# Patient Record
Sex: Female | Born: 1955
Health system: Southern US, Community
[De-identification: ages and names within clinical notes are randomized; demographics above are authoritative.]

## PROBLEM LIST (undated history)

## (undated) DIAGNOSIS — E119 Type 2 diabetes mellitus without complications: Secondary | ICD-10-CM

## (undated) DIAGNOSIS — N189 Chronic kidney disease, unspecified: Secondary | ICD-10-CM

## (undated) DIAGNOSIS — Z923 Personal history of irradiation: Secondary | ICD-10-CM

## (undated) DIAGNOSIS — Z9221 Personal history of antineoplastic chemotherapy: Secondary | ICD-10-CM

## (undated) DIAGNOSIS — J189 Pneumonia, unspecified organism: Secondary | ICD-10-CM

## (undated) DIAGNOSIS — K219 Gastro-esophageal reflux disease without esophagitis: Secondary | ICD-10-CM

## (undated) DIAGNOSIS — I10 Essential (primary) hypertension: Secondary | ICD-10-CM

## (undated) DIAGNOSIS — M170 Bilateral primary osteoarthritis of knee: Secondary | ICD-10-CM

## (undated) DIAGNOSIS — Z803 Family history of malignant neoplasm of breast: Secondary | ICD-10-CM

## (undated) DIAGNOSIS — Z973 Presence of spectacles and contact lenses: Secondary | ICD-10-CM

## (undated) DIAGNOSIS — C50919 Malignant neoplasm of unspecified site of unspecified female breast: Secondary | ICD-10-CM

## (undated) HISTORY — PX: MENISCUS REPAIR: SHX5179

## (undated) HISTORY — PX: ABDOMINAL HYSTERECTOMY: SHX81

## (undated) HISTORY — DX: Bilateral primary osteoarthritis of knee: M17.0

## (undated) HISTORY — DX: Family history of malignant neoplasm of breast: Z80.3

## (undated) HISTORY — DX: Type 2 diabetes mellitus without complications: E11.9

## (undated) HISTORY — PX: BLADDER SUSPENSION: SHX72

## (undated) HISTORY — DX: Malignant neoplasm of unspecified site of unspecified female breast: C50.919

## (undated) HISTORY — PX: BREAST LUMPECTOMY: SHX2

## (undated) HISTORY — PX: OOPHORECTOMY: SHX86

## (undated) HISTORY — PX: JOINT REPLACEMENT: SHX530

---

## 1976-10-02 HISTORY — PX: APPENDECTOMY: SHX54

## 1977-10-02 HISTORY — PX: ABDOMINAL HYSTERECTOMY: SHX81

## 1998-05-31 ENCOUNTER — Ambulatory Visit (HOSPITAL_COMMUNITY): Admission: RE | Admit: 1998-05-31 | Discharge: 1998-05-31 | Payer: Self-pay

## 1998-12-29 ENCOUNTER — Encounter: Admission: RE | Admit: 1998-12-29 | Discharge: 1999-01-24 | Payer: Self-pay | Admitting: *Deleted

## 2000-11-13 ENCOUNTER — Emergency Department (HOSPITAL_COMMUNITY): Admission: EM | Admit: 2000-11-13 | Discharge: 2000-11-13 | Payer: Self-pay | Admitting: Emergency Medicine

## 2001-04-25 ENCOUNTER — Other Ambulatory Visit: Admission: RE | Admit: 2001-04-25 | Discharge: 2001-04-25 | Payer: Self-pay | Admitting: Obstetrics and Gynecology

## 2001-06-21 ENCOUNTER — Ambulatory Visit (HOSPITAL_COMMUNITY): Admission: RE | Admit: 2001-06-21 | Discharge: 2001-06-21 | Payer: Self-pay | Admitting: Gastroenterology

## 2003-02-25 ENCOUNTER — Emergency Department (HOSPITAL_COMMUNITY): Admission: EM | Admit: 2003-02-25 | Discharge: 2003-02-25 | Payer: Self-pay | Admitting: Emergency Medicine

## 2003-05-21 ENCOUNTER — Encounter: Payer: Self-pay | Admitting: Obstetrics and Gynecology

## 2003-05-21 ENCOUNTER — Ambulatory Visit (HOSPITAL_COMMUNITY): Admission: RE | Admit: 2003-05-21 | Discharge: 2003-05-21 | Payer: Self-pay | Admitting: Obstetrics and Gynecology

## 2004-04-13 ENCOUNTER — Emergency Department (HOSPITAL_COMMUNITY): Admission: EM | Admit: 2004-04-13 | Discharge: 2004-04-13 | Payer: Self-pay | Admitting: Emergency Medicine

## 2004-07-29 ENCOUNTER — Ambulatory Visit: Payer: Self-pay | Admitting: Gastroenterology

## 2005-03-09 ENCOUNTER — Ambulatory Visit (HOSPITAL_COMMUNITY): Admission: RE | Admit: 2005-03-09 | Discharge: 2005-03-09 | Payer: Self-pay | Admitting: Obstetrics & Gynecology

## 2006-01-17 ENCOUNTER — Ambulatory Visit (HOSPITAL_COMMUNITY): Admission: RE | Admit: 2006-01-17 | Discharge: 2006-01-17 | Payer: Self-pay | Admitting: Pulmonary Disease

## 2006-02-22 ENCOUNTER — Ambulatory Visit (HOSPITAL_COMMUNITY): Admission: RE | Admit: 2006-02-22 | Discharge: 2006-02-22 | Payer: Self-pay | Admitting: Internal Medicine

## 2006-02-22 ENCOUNTER — Ambulatory Visit: Payer: Self-pay | Admitting: Internal Medicine

## 2006-03-12 ENCOUNTER — Ambulatory Visit (HOSPITAL_COMMUNITY): Admission: RE | Admit: 2006-03-12 | Discharge: 2006-03-12 | Payer: Self-pay | Admitting: Internal Medicine

## 2006-03-12 ENCOUNTER — Ambulatory Visit: Payer: Self-pay | Admitting: Internal Medicine

## 2006-03-18 ENCOUNTER — Emergency Department (HOSPITAL_COMMUNITY): Admission: EM | Admit: 2006-03-18 | Discharge: 2006-03-18 | Payer: Self-pay | Admitting: Emergency Medicine

## 2006-04-13 ENCOUNTER — Ambulatory Visit (HOSPITAL_COMMUNITY): Admission: RE | Admit: 2006-04-13 | Discharge: 2006-04-13 | Payer: Self-pay | Admitting: Obstetrics and Gynecology

## 2006-10-16 ENCOUNTER — Emergency Department (HOSPITAL_COMMUNITY): Admission: EM | Admit: 2006-10-16 | Discharge: 2006-10-16 | Payer: Self-pay | Admitting: Family Medicine

## 2006-11-08 ENCOUNTER — Ambulatory Visit (HOSPITAL_COMMUNITY): Admission: RE | Admit: 2006-11-08 | Discharge: 2006-11-08 | Payer: Self-pay | Admitting: Pulmonary Disease

## 2007-03-20 ENCOUNTER — Ambulatory Visit (HOSPITAL_COMMUNITY): Admission: RE | Admit: 2007-03-20 | Discharge: 2007-03-20 | Payer: Self-pay | Admitting: Pulmonary Disease

## 2007-04-08 ENCOUNTER — Encounter: Payer: Self-pay | Admitting: Obstetrics and Gynecology

## 2007-04-08 ENCOUNTER — Observation Stay (HOSPITAL_COMMUNITY): Admission: RE | Admit: 2007-04-08 | Discharge: 2007-04-09 | Payer: Self-pay | Admitting: Obstetrics and Gynecology

## 2007-08-02 ENCOUNTER — Ambulatory Visit (HOSPITAL_COMMUNITY): Admission: RE | Admit: 2007-08-02 | Discharge: 2007-08-02 | Payer: Self-pay | Admitting: Pulmonary Disease

## 2007-09-20 ENCOUNTER — Emergency Department (HOSPITAL_COMMUNITY): Admission: EM | Admit: 2007-09-20 | Discharge: 2007-09-20 | Payer: Self-pay | Admitting: Emergency Medicine

## 2008-02-03 ENCOUNTER — Ambulatory Visit (HOSPITAL_COMMUNITY): Admission: RE | Admit: 2008-02-03 | Discharge: 2008-02-03 | Payer: Self-pay | Admitting: Sports Medicine

## 2008-05-25 ENCOUNTER — Ambulatory Visit (HOSPITAL_BASED_OUTPATIENT_CLINIC_OR_DEPARTMENT_OTHER): Admission: RE | Admit: 2008-05-25 | Discharge: 2008-05-25 | Payer: Self-pay | Admitting: Orthopedic Surgery

## 2009-10-02 DIAGNOSIS — C50919 Malignant neoplasm of unspecified site of unspecified female breast: Secondary | ICD-10-CM

## 2009-10-02 HISTORY — DX: Malignant neoplasm of unspecified site of unspecified female breast: C50.919

## 2010-04-08 ENCOUNTER — Encounter: Admission: RE | Admit: 2010-04-08 | Discharge: 2010-04-08 | Payer: Self-pay | Admitting: Pulmonary Disease

## 2010-04-15 ENCOUNTER — Encounter: Admission: RE | Admit: 2010-04-15 | Discharge: 2010-04-15 | Payer: Self-pay | Admitting: Pulmonary Disease

## 2010-05-06 ENCOUNTER — Encounter: Admission: RE | Admit: 2010-05-06 | Discharge: 2010-05-06 | Payer: Self-pay | Admitting: Surgery

## 2010-05-10 ENCOUNTER — Ambulatory Visit (HOSPITAL_BASED_OUTPATIENT_CLINIC_OR_DEPARTMENT_OTHER): Admission: RE | Admit: 2010-05-10 | Discharge: 2010-05-10 | Payer: Self-pay | Admitting: Surgery

## 2010-05-18 ENCOUNTER — Ambulatory Visit: Payer: Self-pay | Admitting: Oncology

## 2010-05-27 ENCOUNTER — Encounter: Admission: RE | Admit: 2010-05-27 | Discharge: 2010-05-27 | Payer: Self-pay | Admitting: Oncology

## 2010-06-03 ENCOUNTER — Ambulatory Visit
Admission: RE | Admit: 2010-06-03 | Discharge: 2010-06-29 | Payer: Self-pay | Source: Home / Self Care | Admitting: Radiation Oncology

## 2010-06-03 ENCOUNTER — Encounter: Admission: RE | Admit: 2010-06-03 | Discharge: 2010-06-03 | Payer: Self-pay | Admitting: Oncology

## 2010-06-17 ENCOUNTER — Ambulatory Visit: Payer: Self-pay | Admitting: Oncology

## 2010-06-28 ENCOUNTER — Ambulatory Visit: Payer: Self-pay | Admitting: Genetic Counselor

## 2010-07-01 LAB — CBC WITH DIFFERENTIAL/PLATELET
BASO%: 0 % (ref 0.0–2.0)
EOS%: 0 % (ref 0.0–7.0)
HGB: 13.5 g/dL (ref 11.6–15.9)
LYMPH%: 6.9 % — ABNORMAL LOW (ref 14.0–49.7)
MCHC: 33.3 g/dL (ref 31.5–36.0)
MONO#: 0.4 10*3/uL (ref 0.1–0.9)
MONO%: 1.8 % (ref 0.0–14.0)
NEUT%: 91.3 % — ABNORMAL HIGH (ref 38.4–76.8)
RDW: 14.1 % (ref 11.2–14.5)

## 2010-07-08 LAB — COMPREHENSIVE METABOLIC PANEL
AST: 25 U/L (ref 0–37)
Albumin: 3.7 g/dL (ref 3.5–5.2)
Alkaline Phosphatase: 67 U/L (ref 39–117)
CO2: 30 mEq/L (ref 19–32)
Calcium: 9.6 mg/dL (ref 8.4–10.5)
Creatinine, Ser: 0.87 mg/dL (ref 0.40–1.20)
Glucose, Bld: 97 mg/dL (ref 70–99)
Total Bilirubin: 0.7 mg/dL (ref 0.3–1.2)

## 2010-07-08 LAB — CBC WITH DIFFERENTIAL/PLATELET
BASO%: 0.8 % (ref 0.0–2.0)
MCV: 84.1 fL (ref 79.5–101.0)
Platelets: 359 10*3/uL (ref 145–400)
RDW: 13.5 % (ref 11.2–14.5)
nRBC: 1 % — ABNORMAL HIGH (ref 0–0)

## 2010-07-20 ENCOUNTER — Ambulatory Visit: Payer: Self-pay | Admitting: Oncology

## 2010-07-22 LAB — CBC WITH DIFFERENTIAL/PLATELET
BASO%: 0.1 % (ref 0.0–2.0)
Basophils Absolute: 0 10*3/uL (ref 0.0–0.1)
EOS%: 0 % (ref 0.0–7.0)
Eosinophils Absolute: 0 10*3/uL (ref 0.0–0.5)
HCT: 35.5 % (ref 34.8–46.6)
LYMPH%: 5.9 % — ABNORMAL LOW (ref 14.0–49.7)
MCH: 27.9 pg (ref 25.1–34.0)
MONO#: 0.3 10*3/uL (ref 0.1–0.9)
MONO%: 1.7 % (ref 0.0–14.0)
NEUT%: 92.3 % — ABNORMAL HIGH (ref 38.4–76.8)
RBC: 4.23 10*6/uL (ref 3.70–5.45)
lymph#: 1 10*3/uL (ref 0.9–3.3)

## 2010-07-22 LAB — COMPREHENSIVE METABOLIC PANEL
AST: 22 U/L (ref 0–37)
Alkaline Phosphatase: 56 U/L (ref 39–117)
BUN: 11 mg/dL (ref 6–23)
Glucose, Bld: 153 mg/dL — ABNORMAL HIGH (ref 70–99)
Sodium: 139 mEq/L (ref 135–145)

## 2010-07-29 LAB — CBC WITH DIFFERENTIAL/PLATELET
EOS%: 1.7 % (ref 0.0–7.0)
HCT: 35.2 % (ref 34.8–46.6)
MCH: 28.8 pg (ref 25.1–34.0)
MCHC: 33.1 g/dL (ref 31.5–36.0)
NEUT#: 13.4 10*3/uL — ABNORMAL HIGH (ref 1.5–6.5)
NEUT%: 82.6 % — ABNORMAL HIGH (ref 38.4–76.8)
Platelets: 356 10*3/uL (ref 145–400)
WBC: 16.3 10*3/uL — ABNORMAL HIGH (ref 3.9–10.3)

## 2010-08-08 LAB — CBC WITH DIFFERENTIAL/PLATELET
BASO%: 0.2 % (ref 0.0–2.0)
Basophils Absolute: 0 10*3/uL (ref 0.0–0.1)
HGB: 11.6 g/dL (ref 11.6–15.9)
LYMPH%: 22.3 % (ref 14.0–49.7)
MCHC: 33.4 g/dL (ref 31.5–36.0)
MONO%: 5.2 % (ref 0.0–14.0)
NEUT#: 5.9 10*3/uL (ref 1.5–6.5)
RBC: 4.01 10*6/uL (ref 3.70–5.45)
RDW: 14.9 % — ABNORMAL HIGH (ref 11.2–14.5)
lymph#: 1.8 10*3/uL (ref 0.9–3.3)

## 2010-08-08 LAB — COMPREHENSIVE METABOLIC PANEL
CO2: 28 mEq/L (ref 19–32)
Creatinine, Ser: 0.9 mg/dL (ref 0.40–1.20)
Potassium: 2.9 mEq/L — ABNORMAL LOW (ref 3.5–5.3)
Total Bilirubin: 0.7 mg/dL (ref 0.3–1.2)

## 2010-08-12 LAB — COMPREHENSIVE METABOLIC PANEL
ALT: 21 U/L (ref 0–35)
CO2: 26 mEq/L (ref 19–32)
Glucose, Bld: 144 mg/dL — ABNORMAL HIGH (ref 70–99)
Sodium: 140 mEq/L (ref 135–145)
Total Bilirubin: 0.6 mg/dL (ref 0.3–1.2)
Total Protein: 7.6 g/dL (ref 6.0–8.3)

## 2010-08-12 LAB — CBC WITH DIFFERENTIAL/PLATELET
BASO%: 0.1 % (ref 0.0–2.0)
HGB: 11.3 g/dL — ABNORMAL LOW (ref 11.6–15.9)
MCH: 28 pg (ref 25.1–34.0)
MONO#: 0.1 10*3/uL (ref 0.1–0.9)
Platelets: 442 10*3/uL — ABNORMAL HIGH (ref 145–400)
RBC: 4.03 10*6/uL (ref 3.70–5.45)
WBC: 9.1 10*3/uL (ref 3.9–10.3)

## 2010-08-19 ENCOUNTER — Ambulatory Visit: Payer: Self-pay | Admitting: Oncology

## 2010-08-19 LAB — COMPREHENSIVE METABOLIC PANEL
ALT: 40 U/L — ABNORMAL HIGH (ref 0–35)
Albumin: 4 g/dL (ref 3.5–5.2)
Alkaline Phosphatase: 74 U/L (ref 39–117)
Chloride: 100 mEq/L (ref 96–112)
Creatinine, Ser: 0.9 mg/dL (ref 0.40–1.20)
Potassium: 4 mEq/L (ref 3.5–5.3)
Total Protein: 7.1 g/dL (ref 6.0–8.3)

## 2010-08-19 LAB — CBC WITH DIFFERENTIAL/PLATELET
BASO%: 0.5 % (ref 0.0–2.0)
Eosinophils Absolute: 0.1 10*3/uL (ref 0.0–0.5)
HGB: 11.5 g/dL — ABNORMAL LOW (ref 11.6–15.9)
LYMPH%: 32.7 % (ref 14.0–49.7)
MCHC: 33.6 g/dL (ref 31.5–36.0)
MCV: 87.1 fL (ref 79.5–101.0)
MONO#: 0.6 10*3/uL (ref 0.1–0.9)
MONO%: 11.6 % (ref 0.0–14.0)
NEUT#: 2.6 10*3/uL (ref 1.5–6.5)
Platelets: 401 10*3/uL — ABNORMAL HIGH (ref 145–400)
RBC: 3.94 10*6/uL (ref 3.70–5.45)
RDW: 16 % — ABNORMAL HIGH (ref 11.2–14.5)
WBC: 4.8 10*3/uL (ref 3.9–10.3)

## 2010-09-02 ENCOUNTER — Ambulatory Visit
Admission: RE | Admit: 2010-09-02 | Discharge: 2010-09-02 | Payer: Self-pay | Source: Home / Self Care | Admitting: Physician Assistant

## 2010-09-02 ENCOUNTER — Encounter (INDEPENDENT_AMBULATORY_CARE_PROVIDER_SITE_OTHER): Payer: Self-pay | Admitting: Cardiovascular Disease

## 2010-09-02 LAB — COMPREHENSIVE METABOLIC PANEL
ALT: 26 U/L (ref 0–35)
AST: 26 U/L (ref 0–37)
Albumin: 4 g/dL (ref 3.5–5.2)
Alkaline Phosphatase: 49 U/L (ref 39–117)
CO2: 26 mEq/L (ref 19–32)
Total Bilirubin: 0.6 mg/dL (ref 0.3–1.2)

## 2010-09-02 LAB — CBC WITH DIFFERENTIAL/PLATELET
BASO%: 0.2 % (ref 0.0–2.0)
Basophils Absolute: 0 10*3/uL (ref 0.0–0.1)
Eosinophils Absolute: 0 10*3/uL (ref 0.0–0.5)
HCT: 33.4 % — ABNORMAL LOW (ref 34.8–46.6)
LYMPH%: 7.4 % — ABNORMAL LOW (ref 14.0–49.7)
MCHC: 33.2 g/dL (ref 31.5–36.0)
MCV: 85.9 fL (ref 79.5–101.0)
NEUT#: 11.6 10*3/uL — ABNORMAL HIGH (ref 1.5–6.5)
Platelets: 373 10*3/uL (ref 145–400)
RBC: 3.89 10*6/uL (ref 3.70–5.45)
WBC: 12.9 10*3/uL — ABNORMAL HIGH (ref 3.9–10.3)
lymph#: 1 10*3/uL (ref 0.9–3.3)
nRBC: 0 % (ref 0–0)

## 2010-09-12 ENCOUNTER — Ambulatory Visit: Payer: Self-pay | Admitting: Genetic Counselor

## 2010-09-13 ENCOUNTER — Ambulatory Visit
Admission: RE | Admit: 2010-09-13 | Discharge: 2010-11-01 | Payer: Self-pay | Source: Home / Self Care | Attending: Radiation Oncology | Admitting: Radiation Oncology

## 2010-09-13 LAB — CBC WITH DIFFERENTIAL/PLATELET
BASO%: 0.1 % (ref 0.0–2.0)
Eosinophils Absolute: 0 10*3/uL (ref 0.0–0.5)
HCT: 33.6 % — ABNORMAL LOW (ref 34.8–46.6)
HGB: 11.2 g/dL — ABNORMAL LOW (ref 11.6–15.9)
MCH: 29.7 pg (ref 25.1–34.0)
MCV: 88.9 fL (ref 79.5–101.0)
Platelets: 364 10*3/uL (ref 145–400)
WBC: 36.4 10*3/uL — ABNORMAL HIGH (ref 3.9–10.3)
lymph#: 2.8 10*3/uL (ref 0.9–3.3)

## 2010-10-23 ENCOUNTER — Encounter: Payer: Self-pay | Admitting: Pulmonary Disease

## 2010-11-02 ENCOUNTER — Ambulatory Visit: Payer: PRIVATE HEALTH INSURANCE | Attending: Radiation Oncology | Admitting: Radiation Oncology

## 2010-11-02 DIAGNOSIS — L2989 Other pruritus: Secondary | ICD-10-CM | POA: Insufficient documentation

## 2010-11-02 DIAGNOSIS — L298 Other pruritus: Secondary | ICD-10-CM | POA: Insufficient documentation

## 2010-11-02 DIAGNOSIS — Z510 Encounter for antineoplastic radiation therapy: Secondary | ICD-10-CM | POA: Insufficient documentation

## 2010-11-02 DIAGNOSIS — R5381 Other malaise: Secondary | ICD-10-CM | POA: Insufficient documentation

## 2010-11-02 DIAGNOSIS — L538 Other specified erythematous conditions: Secondary | ICD-10-CM | POA: Insufficient documentation

## 2010-11-02 DIAGNOSIS — C50219 Malignant neoplasm of upper-inner quadrant of unspecified female breast: Secondary | ICD-10-CM | POA: Insufficient documentation

## 2010-11-02 DIAGNOSIS — C50419 Malignant neoplasm of upper-outer quadrant of unspecified female breast: Secondary | ICD-10-CM | POA: Insufficient documentation

## 2010-11-02 DIAGNOSIS — R5383 Other fatigue: Secondary | ICD-10-CM | POA: Insufficient documentation

## 2010-12-06 ENCOUNTER — Encounter (HOSPITAL_BASED_OUTPATIENT_CLINIC_OR_DEPARTMENT_OTHER): Payer: PRIVATE HEALTH INSURANCE | Admitting: Oncology

## 2010-12-06 ENCOUNTER — Other Ambulatory Visit: Payer: Self-pay | Admitting: Oncology

## 2010-12-06 DIAGNOSIS — R05 Cough: Secondary | ICD-10-CM

## 2010-12-06 DIAGNOSIS — I10 Essential (primary) hypertension: Secondary | ICD-10-CM

## 2010-12-06 DIAGNOSIS — C50219 Malignant neoplasm of upper-inner quadrant of unspecified female breast: Secondary | ICD-10-CM

## 2010-12-06 DIAGNOSIS — R509 Fever, unspecified: Secondary | ICD-10-CM

## 2010-12-06 LAB — CBC WITH DIFFERENTIAL/PLATELET
BASO%: 2.2 % — ABNORMAL HIGH (ref 0.0–2.0)
EOS%: 2.4 % (ref 0.0–7.0)
Eosinophils Absolute: 0.1 10*3/uL (ref 0.0–0.5)
LYMPH%: 21.9 % (ref 14.0–49.7)
MCH: 28.5 pg (ref 25.1–34.0)
MCHC: 33.2 g/dL (ref 31.5–36.0)
MONO%: 6 % (ref 0.0–14.0)
RBC: 4.48 10*6/uL (ref 3.70–5.45)
RDW: 14 % (ref 11.2–14.5)
WBC: 4.1 10*3/uL (ref 3.9–10.3)
lymph#: 0.9 10*3/uL (ref 0.9–3.3)

## 2010-12-06 LAB — COMPREHENSIVE METABOLIC PANEL
AST: 20 U/L (ref 0–37)
Albumin: 4.2 g/dL (ref 3.5–5.2)
BUN: 9 mg/dL (ref 6–23)
CO2: 25 mEq/L (ref 19–32)
Chloride: 100 mEq/L (ref 96–112)
Glucose, Bld: 107 mg/dL — ABNORMAL HIGH (ref 70–99)
Potassium: 3.5 mEq/L (ref 3.5–5.3)
Sodium: 138 mEq/L (ref 135–145)
Total Protein: 7 g/dL (ref 6.0–8.3)

## 2010-12-16 LAB — URINALYSIS, ROUTINE W REFLEX MICROSCOPIC
Bilirubin Urine: NEGATIVE
Ketones, ur: NEGATIVE mg/dL
Nitrite: NEGATIVE
Urobilinogen, UA: 0.2 mg/dL (ref 0.0–1.0)

## 2010-12-16 LAB — COMPREHENSIVE METABOLIC PANEL
ALT: 17 U/L (ref 0–35)
Alkaline Phosphatase: 67 U/L (ref 39–117)
CO2: 28 mEq/L (ref 19–32)
Chloride: 103 mEq/L (ref 96–112)
GFR calc non Af Amer: >60 mL/min (ref >60)
Glucose, Bld: 135 mg/dL — ABNORMAL HIGH (ref 70–99)
Potassium: 3.3 mEq/L — ABNORMAL LOW (ref 3.5–5.1)
Sodium: 137 mEq/L (ref 135–145)
Total Bilirubin: 0.6 mg/dL (ref 0.3–1.2)

## 2010-12-16 LAB — DIFFERENTIAL
Basophils Relative: 0 % (ref 0–1)
Eosinophils Absolute: 0.1 10*3/uL (ref 0.0–0.7)
Monocytes Relative: 10 % (ref 3–12)
Neutrophils Relative %: 54 % (ref 43–77)

## 2010-12-16 LAB — CBC
HCT: 38.8 % (ref 36.0–46.0)
Hemoglobin: 13 g/dL (ref 12.0–15.0)
WBC: 5.6 10*3/uL (ref 4.0–10.5)

## 2010-12-16 LAB — CANCER ANTIGEN 27.29: CA 27.29: 7 U/mL (ref 0–39)

## 2010-12-26 ENCOUNTER — Ambulatory Visit: Payer: PRIVATE HEALTH INSURANCE | Attending: Radiation Oncology | Admitting: Radiation Oncology

## 2011-02-14 NOTE — Op Note (Signed)
[name], DUTSON NO.:  [id number]   MEDICAL RECORD NO.:  [id number]          PATIENT TYPE:  INP   LOCATION:  A314                          FACILITY:  [facility]   PHYSICIAN:  Tilda Burrow, M.D. DATE OF BIRTH:  12-03-1955   DATE OF PROCEDURE:  04/08/2007  DATE OF DISCHARGE:                               OPERATIVE REPORT   PREOPERATIVE DIAGNOSES:  1. Left lower quadrant pain.  2. Cystocele.  3. Rectocele.  4. Pelvic relaxation.   POSTOPERATIVE DIAGNOSES:  1. Left lower quadrant pain.  2. Cystocele.  3. Rectocele.  4. Pelvic relaxation.   PROCEDURE:  1. Laparoscopic left salpingo-oophorectomy.  2. Anterior and posterior repair.   SURGEON:  Ferguson.   ASSISTANT:  None.   ANESTHESIA:  General.   COMPLICATIONS:  None.   FINDINGS:  Left ovary adherent to pelvic sidewall and pelvic floor  within adhesions.  There was an adhesion from the right tube and ovary  all the way across to the left tube and ovary in a bridge across the  pelvis, adhesions of the right tube and ovary to the right pelvic  sidewall, partially freed up.  Small skin tag vaginal entrance trimmed  and discarded.   DETAILS OF PROCEDURE:  The patient was taken to the operating room,  prepped and draped for planned abdominal and vaginal procedure with legs  in the yellow fin leg support.  The infraumbilical vertical 1 cm skin  incision was made as well as a transverse suprapubic incision.  The  Veress needle was introduced through the umbilicus, 10 mmHg pressure  used or less for pneumoperitoneum, after water droplet test confirmed  satisfactory intraperitoneal fluid intravasation.  The laparoscopic  trocar was placed under direct visualization, the pelvis inspected, no  evidence of trauma or bleeding noted.  Inspection of the suprapubic area  allowed the 5 mm suprapubic trocar to be placed under direct  visualization and pelvis inspected with patient having been placed in  Trendelenburg position.  The pelvis was found to have adhesions, as  mentioned in the HPI.  The third left lower quadrant port was placed, a  12 mm port.  We then proceeded to inspect the pelvis and with the  bipolar grasping cutting device, ligature, we were able to isolate the  left infundibulopelvic ligament, free it from epiploic fat appendages  and adhesions and then take down the left tube and ovary off the  sidewall with care and ease.  The ureter could be visualized  approximately 1 cm away from the area of surgery and was felt to be  unaffected.  The right side had some thin adhesions where the tube was  attached to the side wall, these were freed up.  Some of the dense  adhesions from the ovary to the right pelvic sidewall were trimmed as  much as could, but we were somewhat limited as though it was densely  adherent overlying the right ureter.  Upper abdomen was inspected and no  adhesions noted.  Procedure was completed by removal of the specimen  through the left lower quadrant port and then inspection  of the pelvis  confirmed hemostasis.  Irrigation of the abdomen followed.  Saline 200  mL left intraabdominal.  Deflation of the abdomen performed and the  fascia closed and the left lower quadrant port.  The subcu tissues were  approximated with #2-0 plain and subcuticular #4-0 Dexon closure of the  skin performed.   ANTERIOR AND POSTERIOR REPAIR.  The patient was then repositioned with  legs raised in the high lithotomy position, using the same yellow fin  stirrups.  The vagina had been prepped and draped earlier.  The anterior  vaginal mucosa was grasped just above the scar from the old hysterectomy  and a 2 cm wide by 5 cm long wedge of skin removed.  The lateral  dissection allowed identification of some subcutaneous good strong  supportive tissues on each side.  These were pulled in the midline with  3 interrupted sutures of #0 Vicryl and then #2-0 chromic used to close   the skin with good improved support of the bladder anteriorly.   POSTERIOR REPAIR.  Posterior repair consisted of dressing the posterior  perineal body with 2 Allis clamps, infiltrating the posterior perineal  body with Xylocaine with epinephrine, then splitting the vaginal mucosa  over the rectal area and peeling the supportive tissues away from the  vaginal epithelium.  With a double-gloved right index finger in the  rectum, we were able to identify connective tissue that was quite strong  on the left, patient's left side, and seemingly disrupted much higher on  the patient's right side.  A site specific defect repair was performed,  identifying tissues high on the right side for which the left side could  be attached.  A series of 3 or 4 interrupted #0 Vicryl were necessary  and good tissue approximation performed.  Perineal body was rebuilt with  two additional sutures of #0 Vicryl used in a horizontal mattress to  complete the second layer of suturing.  The vaginal mucosa was  _trimmed__ posteriorly and the vaginal epithelium closed with running #2-  0 chromic.  Sponge and needle counts correct.  Patient went to the  recovery room in good condition.      Tilda Burrow, M.D.  Electronically Signed     JVF/MEDQ  D:  04/08/2007  T:  04/08/2007  Job:  [id number]   cc:   Ramon Dredge L. Juanetta Gosling, M.D.  Fax: [phone]

## 2011-02-14 NOTE — H&P (Signed)
NAMEMORNING, HALBERG             ACCOUNT NO.:  [id number]   MEDICAL RECORD NO.:  [id number]          PATIENT TYPE:  AMB   LOCATION:  DAY                           FACILITY:  [facility]   PHYSICIAN:  Tilda Burrow, M.D. DATE OF BIRTH:  1956-09-21   DATE OF ADMISSION:  DATE OF DISCHARGE:  LH                              HISTORY & PHYSICAL   CHIEF COMPLAINT:  Left lower quadrant pain.  Pelvic relaxation with  cystocele and rectocele.   HISTORY OF PRESENT ILLNESS:  This 55 year old female is admitted for  chronic left lower quadrant pain.  Over the past several months she  describes a point of tenderness just below the inferior edge of her  lower abdominal hysterectomy incision.  The pain is progressively worse  as the day goes on.  There is no pain associated with the incision.  At  one time there was a question of a lymph node but that is resolved  without change in the pain.  She has had an exam which shows there is  diffuse pelvic relaxation and it appears that the discomfort is worsened  as the day goes on, worse by  lifting, straining or pushing down.  It is  felt that likely the pelvic relaxation is resulting in tugging on pelvic  structures, particularly the tube and ovary on the left.  The plans are  first to perform laparoscopy, probable left salpingo-oophorectomy and  then to address pelvic relaxation.  This will consist of an anterior and  posterior repair.  Efforts to preserve the right tube and ovary are  planned unless visible abnormalities are encountered at the time of the  procedure.  The patient did have a cyst on the right ovary noted earlier  in June but it was considered benign and functional in character.  The  patient is aware that we cannot give her absolute guarantees that this  will solve her problems but the discomfort warrants intervention at this  time.   PAST MEDICAL HISTORY:  Positive for hypertension.   OBSTETRICAL/GYN HISTORY:  Notable for hysterectomy  performed abdominally  many years ago.  Chronic left lower quadrant pain has been evaluated in  our office as far back as 2006 without sequelae. She has had general  surgical consult by Dr. Zigmund Daniel and there was no evidence of  hernia or inguinal abnormality.   PAST SURGICAL HISTORY:  Hysterectomy in 1980 at [facility].   INJURIES:  None.   ALLERGIES:  PENICILLIN, CODEINE.   MEDICATIONS:  1. Vicodin.  2. Hydrochlorothiazide.  3. Darvocet.   REVIEW OF SYSTEMS:  Notable for neck ache on the left side of the neck  that began with a pop while she was turning her head about a week ago,  felt to represent a musculoskeletal disorder only and not anything  neurologic.   PHYSICAL EXAMINATION:  GENERAL APPEARANCE:  General exam shows a  healthy, cheerful, alert African-American female.  HEENT:  Pupils equal, round, reactive.  NECK:  Supple.  CHEST:  Clear.  ABDOMEN:  Well-healed Pfannenstiel incision.  No masses or hernias.  PELVIC:  External genitalia multiparous female.  Coughing reveals  significant descent of the bladder and cystocele.  There is no loss of  urine.  The uterus is absent.  Vaginal apex was very floppy and relaxed.  Digital/rectal exam shows a high rectocele that protrudes greater than  90 degrees.  The patient does describe this as being symptomatic with  difficulty with defecation and sense of incomplete evacuation and  straining to achieve defecation.   IMPRESSION:  1. Left lower quadrant pain, questionable pelvic adhesions with      traction on the ovary.  2. Pelvic relaxation with cystocele and rectocele.   PLAN:  Laparoscopic evaluation.  Probable left salpingo-oophorectomy  followed by anterior and posterior repair April 08, 2007.      Tilda Burrow, M.D.  Electronically Signed     JVF/MEDQ  D:  04/04/2007  T:  04/05/2007  Job:  [id number]   cc:   Ramon Dredge L. Juanetta Gosling, M.D.  Fax: [phone]

## 2011-02-14 NOTE — Op Note (Signed)
NAMEFARON, WHITELOCK             ACCOUNT NO.:  [id number]   MEDICAL RECORD NO.:  [id number]          PATIENT TYPE:  AMB   LOCATION:  DSC                          FACILITY:  [facility]   PHYSICIAN:  Robert A. Thurston Hole, M.D. DATE OF BIRTH:  06-Aug-1956   DATE OF PROCEDURE:  05/25/2008  DATE OF DISCHARGE:                               OPERATIVE REPORT   PREOPERATIVE DIAGNOSES:  Right knee medial and lateral meniscus tears  with chondromalacia and synovitis.   POSTOPERATIVE DIAGNOSES:  Right knee medial and lateral meniscus tears  with chondromalacia and synovitis.   PROCEDURES:  1. Right knee examination under anesthesia followed by arthroscopic      partial medial and lateral meniscectomies.  2. Right knee chondroplasty with partial synovectomy.   SURGEON:  Elana Alm. Thurston Hole, MD   ANESTHESIA:  General.   OPERATIVE TIME:  40 minutes.   COMPLICATIONS:  None.   INDICATIONS FOR PROCEDURE:  Ms. [name] is a 55 year old woman who has  had 5-6 months of increasing right knee pain with the exam and MRI  documenting meniscal tearing with chondromalacia and synovitis.  She has  failed conservative care, and is now to undergo arthroscopy.   DESCRIPTION:  Ms. [name] was brought to the operating room on May 25, 2008, after a knee block was placed in holding area by anesthesia.  She was placed on operating table in supine position.  After being  placed under general anesthesia, her right knee was examined.  She had  full range of motion, her knee was stable on ligamentous exam with  normal patellar tracking.  The right leg was prepped using sterile  DuraPrep and draped using sterile technique.  Originally, through an  anterolateral portal, the arthroscope with a pump attached was placed  and through an anteromedial portal, an arthroscopic probe was placed.  On initial inspection of the medial compartment, articular cartilage  showed 75% grade 3 chondromalacia, which was debrided.  Medial  meniscus  tear, posterior and medial horn of which 30-40% was resected back to a  stable rim.  Intercondylar notch inspected.  Anterior and posterior  cruciate ligaments were normal.  Lateral compartment was inspected,  grade 1 and 2 chondromalacia noted.  Lateral meniscus tear, posterior  and lateral horn 25-30%, which was resected back to a stable rim.  Patellofemoral joint showed 30% grade 3 chondromalacia on the  patellofemoral groove and this was debrided.  The patella tracked  normally.  Moderate synovitis, and medial and lateral gutters were  debrided.  Otherwise, they were free of pathology.  After this was done,  it was felt that all pathology have been satisfactorily addressed.  The  instruments were removed.  The portals were closed with 3-0 nylon suture  and injected with 0.25% Marcaine with epinephrine and 4 mg of morphine.  Sterile dressings were applied, and the patient awakened and taken to  the recovery room in stable condition.   FOLLOWUP CARE:  Ms. [name] will be followed as an outpatient on Talwin  NX, Darvocet, and Mobic.  She will be seen back in the office in a week  for sutures out and followup.      Robert A. Thurston Hole, M.D.  Electronically Signed     RAW/MEDQ  D:  05/25/2008  T:  05/25/2008  Job:  [id number]

## 2011-02-14 NOTE — Discharge Summary (Signed)
[name], GRUPP NO.:  [id number]   MEDICAL RECORD NO.:  [id number]          PATIENT TYPE:  INP   LOCATION:  A314                          FACILITY:  [facility]   PHYSICIAN:  Tilda Burrow, M.D. DATE OF BIRTH:  09/01/1956   DATE OF ADMISSION:  04/08/2007  DATE OF DISCHARGE:  07/08/2008LH                               DISCHARGE SUMMARY   ADMITTING DIAGNOSES:  1. Left lower quadrant pain.  2. Pelvic relaxation with cystocele, rectocele.  3. Hypokalemia.   DISCHARGE DIAGNOSES:  1. Left lower quadrant pain secondary to pelvic adhesions.  2. Pelvic relaxation with cystocele, rectocele.  3. Hypokalemia.   PROCEDURE:  1. April 08, 2007, laparoscopic left salpingo-oophorectomy.  2. Anterior and posterior repair.   HOSPITAL SUMMARY:  This 55 year old female was admitted for chronic left  lower quadrant pain, during which evaluation a question of pelvic  adhesions became suspected.  Additionally, the pelvis was quite relaxed.  She has symptomatic rectocele and generalized pelvic relaxation,  anterior and posterior repair is planned.   Procedure was performed on April 08, 2007.   ADMITTING LABS:  Normal sinus rhythm on EKG.  A BUN 12, creatinine is 1,  GFR greater than 60, potassium 3.3 on thiazide diuretic despite efforts  at increasing it with addition of potassium.  Patient underwent surgery  in an uneventful fashion with left salpingo-oophorectomy performed  laparoscopically and anterior and posterior repair performed.  Postoperatively, the patient felt quite well and actually the left lower  quadrant pain was improved on postop day 1.  She did experience some  perineal tenderness associated with the stitches in the posterior repair  that are closest to the vaginal entrance.  These are causing a sharp  shooting midline perineal pain which, for right now, has been examined,  does not represent a hematoma and probably represents tension on the  sutures.  This will  be monitored at followup visit in 1 week.   DISCHARGE MEDS AS FOLLOWS:  1. Resume current meds which include:      a.     HCTZ 25 mg p.o. daily.      b.     Darvocet p.r.n. pain.  2. Additional medicines:      a.     K-Dur 20 mEq p.o. daily.      b.     Percocet one q.4h. p.r.n. pain, dispense 30.   FOLLOWUP:  Will be in 1 week and 4 weeks.   ADDENDUM:  Daughter is home from [place] and will have leave extended  through the [employer].      Tilda Burrow, M.D.  Electronically Signed     JVF/MEDQ  D:  04/09/2007  T:  04/09/2007  Job:  [id number]

## 2011-02-15 ENCOUNTER — Encounter: Payer: PRIVATE HEALTH INSURANCE | Admitting: Oncology

## 2011-02-15 ENCOUNTER — Other Ambulatory Visit: Payer: Self-pay | Admitting: Oncology

## 2011-02-15 DIAGNOSIS — N644 Mastodynia: Secondary | ICD-10-CM

## 2011-02-15 DIAGNOSIS — Z9889 Other specified postprocedural states: Secondary | ICD-10-CM

## 2011-02-15 DIAGNOSIS — C50919 Malignant neoplasm of unspecified site of unspecified female breast: Secondary | ICD-10-CM

## 2011-02-17 NOTE — Consult Note (Signed)
[name], [name]             ACCOUNT NO.:  [id number]   MEDICAL RECORD NO.:  [id number]          PATIENT TYPE:  OUT   LOCATION:  RAD                           FACILITY:  [facility]   PHYSICIAN:  R. Roetta Sessions, M.D. DATE OF BIRTH:  [birth year]   DATE OF CONSULTATION:  02/22/2006  DATE OF DISCHARGE:  01/17/2006                                   CONSULTATION   REASON FOR CONSULTATION:  Abdominal pain, hematochezia.   HISTORY OF PRESENT ILLNESS:  Ms. [name] is a very pleasant 55-year-  old African American female sent over by the courtesy of Dr. Juanetta Gosling to  further evaluate at least a several-year history of intermittent left lower  quadrant, left groin, radiating into her flank pain which has been an off  and on phenomenon but more recently in assessment, she tends to get up with  it and go to bed with hit.  She is chronically constipated.  She describes  constipation all of her life.  She also describes thin-caliber formed  stools.  They never are of a wider caliber.  She has had intermittent blood  per rectum recently.   She has had gastroesophageal reflux disease symptoms in the past and has had  an EGD by Dr. Melvia Heaps in [place] and was told she had reflux.  Currently, she does not have any of those symptoms and is not on any acid  suppression therapy.  She has not been on anything like Amitiza or MiraLax  or Kristalose; however, she was in a Zelnorm trial through [employer] for a  period of time.  She was taking this agent, and this facilitated bowel  function to some degree.  She does tend to have some semblance of a bowel  movement everyday but feels it is inadequate and feels that she evacuates  incompletely.   She has been evaluated extensively by Dr. Arlyce Dice and has also been seen by  [facility] and is not felt to have any GYN issues, although  previously she had some ovarian cysts.  She did have a CT scan of the  abdomen and pelvis on January 17, 2006.  The abdominal segment of the exam was  unremarkable.  The pelvic scan revealed a small amount of free fluid which  is likely physiologic.  She is post-hysterectomy.  No evidence of kidney  stones or other processes.   Ms. Donzetta Matters is quite frustrated and feels she has some significant  underlying diagnosis which has not yet been made.  She has had some nausea  from time to time.  There has been no vomiting, and she has not lost any  weight, although her weight dose fluctuate 10 to 15 pounds off and on  throughout her life as she reports.  She really does not have any urinary  tract symptoms.   PAST MEDICAL HISTORY:  1.  Hypertension.  2.  Herpes simplex infection.   PAST SURGICAL HISTORY:  1.  Hysterectomy.  2.  EGD and colonoscopy, as described above.   CURRENT MEDICATIONS:  1.  HCTZ 25 mg daily.  2.  Potassium 500 mg daily.  3.  Valtrex p.r.n.  4.  Darvocet four times a day p.r.n. abdominal and flank pain.   ALLERGIES:  PENICILLIN, CODEINE.   FAMILY HISTORY:  The patient is adopted.  Mother and father's history  unknown.  One biologic sister was known to have liver failure at age 22 and  succumbed to complications related to this problem.   SOCIAL HISTORY:  The patient is separated.  She has three children.  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  She smokes one pack of cigarettes every two days.  She very  seldom consumes alcohol.   REVIEW OF SYSTEMS:  No chest pain.  No dyspnea on exertion.  No fever or  chills.  No night sweats.   PHYSICAL EXAMINATION:  GENERAL:  Very pleasant 55 year old African American  female resting comfortably.  VITAL SIGNS:  Weight 214, height 5 feet 6 inches.  Temperature 99, blood  pressure 148/98, pulse 80.  SKIN:  Warm and dry.  No lesions.  HEENT:  No scleral icterus.  NECK:  JVD is not prominent.  CHEST:  Lungs are clear to auscultation.  CARDIAC:  Regular rate and rhythm without  murmurs, gallops, or rubs.  BREASTS:  Exam deferred.  ABDOMEN:  Obese.  Positive bowel sounds.  Soft.  She does have some left  lower quadrant tenderness to palpation.  No appreciable mass or  organomegaly.  EXTREMITIES:  No edema.  RECTAL:  No external lesions.  Good sphincter tone.  There is no stool or  masses in the rectal vault.  Mucus is Hemoccult-negative.   IMPRESSION:  [name] is a pleasant 55 year old African American  female with a several-year history of left lower quadrant/left inguinal pain  radiating into her flank.  She has symptoms most of the time.  She has  worsening of her chronic constipation which may or not be related to this  pain.  She already has been extensively evaluated.  She had a colonoscopy  some three to four years ago by Dr. Barnet Pall in [place]; however, she  now is reporting hematochezia which really ought to be worked up further at  this time.   There apparently is no evidence of kidney stone disease on a recent CT  imaging.  I would suppose that if she had a hernia or other structural  abnormality, it would have shown up on the CT scan.   RECOMMENDATIONS:  Will go ahead and plan to do a diagnostic colonoscopy in  the near future.  This approach, along with the potential risks, benefits,  and alternatives, have been reviewed with Ms. Donzetta Matters.  She perceives that  she does not evacuate and is chronically backed up.  I think it would be  worth while to get a KUB today and see just what kind of stool load is in  her colon currently.  I will also check a urinalysis.  She states she has  had extensive  blood work, including thyroid function, through Dr. Juanetta Gosling office.  I would  like to retrieve copies of those records for review.  Will make further  recommendations in the very near future.   I would like to thank Dr. Juanetta Gosling for allowing me to see this nice lady  today.     Jonathon Bellows, M.D.  Electronically Signed      RMR/MEDQ  D:  02/22/2006  T:  02/22/2006  Job:  [id number]  cc:   Ramon Dredge L. Juanetta Gosling, M.D.  Fax: [phone]

## 2011-02-20 ENCOUNTER — Ambulatory Visit (HOSPITAL_COMMUNITY)
Admission: RE | Admit: 2011-02-20 | Discharge: 2011-02-20 | Disposition: A | Discharge status: Home / Self Care | Payer: PRIVATE HEALTH INSURANCE | Source: Ambulatory Visit | Attending: Oncology | Admitting: Oncology

## 2011-02-20 DIAGNOSIS — IMO0002 Reserved for concepts with insufficient information to code with codable children: Secondary | ICD-10-CM | POA: Insufficient documentation

## 2011-02-20 DIAGNOSIS — M51379 Other intervertebral disc degeneration, lumbosacral region without mention of lumbar back pain or lower extremity pain: Secondary | ICD-10-CM | POA: Insufficient documentation

## 2011-02-20 DIAGNOSIS — C50919 Malignant neoplasm of unspecified site of unspecified female breast: Secondary | ICD-10-CM | POA: Insufficient documentation

## 2011-02-20 DIAGNOSIS — M5126 Other intervertebral disc displacement, lumbar region: Secondary | ICD-10-CM | POA: Insufficient documentation

## 2011-02-20 DIAGNOSIS — M5137 Other intervertebral disc degeneration, lumbosacral region: Secondary | ICD-10-CM | POA: Insufficient documentation

## 2011-02-20 DIAGNOSIS — M545 Low back pain, unspecified: Secondary | ICD-10-CM | POA: Insufficient documentation

## 2011-02-20 DIAGNOSIS — M48061 Spinal stenosis, lumbar region without neurogenic claudication: Secondary | ICD-10-CM | POA: Insufficient documentation

## 2011-02-23 ENCOUNTER — Other Ambulatory Visit: Payer: Self-pay | Admitting: Orthopaedic Surgery

## 2011-02-23 DIAGNOSIS — M25552 Pain in left hip: Secondary | ICD-10-CM

## 2011-02-23 DIAGNOSIS — M79652 Pain in left thigh: Secondary | ICD-10-CM

## 2011-02-23 DIAGNOSIS — R1032 Left lower quadrant pain: Secondary | ICD-10-CM

## 2011-03-28 ENCOUNTER — Encounter (HOSPITAL_BASED_OUTPATIENT_CLINIC_OR_DEPARTMENT_OTHER): Payer: PRIVATE HEALTH INSURANCE | Admitting: Oncology

## 2011-03-28 DIAGNOSIS — C50119 Malignant neoplasm of central portion of unspecified female breast: Secondary | ICD-10-CM

## 2011-07-12 ENCOUNTER — Ambulatory Visit
Admission: RE | Admit: 2011-07-12 | Discharge: 2011-07-12 | Disposition: A | Discharge status: Home / Self Care | Payer: PRIVATE HEALTH INSURANCE | Source: Ambulatory Visit | Attending: Oncology | Admitting: Oncology

## 2011-07-12 DIAGNOSIS — Z9889 Other specified postprocedural states: Secondary | ICD-10-CM

## 2011-07-12 DIAGNOSIS — N644 Mastodynia: Secondary | ICD-10-CM

## 2011-07-18 LAB — BASIC METABOLIC PANEL
BUN: 5 — ABNORMAL LOW
CO2: 30
Calcium: 8.6
Chloride: 101
Creatinine, Ser: 0.87
GFR calc Af Amer: >60
Glucose, Bld: 109 — ABNORMAL HIGH

## 2011-07-18 LAB — POCT I-STAT 4, (NA,K, GLUC, HGB,HCT)
Glucose, Bld: 111 — ABNORMAL HIGH
Operator id: 215841
Potassium: 3.3 — ABNORMAL LOW
Sodium: 137

## 2011-07-18 LAB — COMPREHENSIVE METABOLIC PANEL
AST: 31
BUN: 12
CO2: 32
Calcium: 9.5
Creatinine, Ser: 1
GFR calc Af Amer: >60
GFR calc non Af Amer: 58 — ABNORMAL LOW

## 2011-07-18 LAB — DIFFERENTIAL
Basophils Absolute: 0
Basophils Relative: 0
Eosinophils Absolute: 0.1
Neutro Abs: 7.1
Neutrophils Relative %: 72

## 2011-07-18 LAB — CBC
HCT: 38.3
MCHC: 33.4
MCHC: 33.4
MCV: 85.7
MCV: 86.3
Platelets: 336
Platelets: 402 — ABNORMAL HIGH
RBC: 4.47
RDW: 13.4

## 2011-09-13 ENCOUNTER — Encounter: Payer: Self-pay | Admitting: *Deleted

## 2011-09-13 ENCOUNTER — Other Ambulatory Visit: Payer: Self-pay | Admitting: *Deleted

## 2011-09-13 DIAGNOSIS — C50419 Malignant neoplasm of upper-outer quadrant of unspecified female breast: Secondary | ICD-10-CM

## 2011-09-13 DIAGNOSIS — C50219 Malignant neoplasm of upper-inner quadrant of unspecified female breast: Secondary | ICD-10-CM

## 2011-09-13 DIAGNOSIS — C50919 Malignant neoplasm of unspecified site of unspecified female breast: Secondary | ICD-10-CM

## 2011-09-13 DIAGNOSIS — R232 Flushing: Secondary | ICD-10-CM

## 2011-09-13 MED ORDER — GABAPENTIN 100 MG PO CAPS: 100.0000 mg | ORAL_CAPSULE | Freq: Three times a day (TID) | ORAL | Status: DC

## 2011-11-20 ENCOUNTER — Emergency Department (HOSPITAL_COMMUNITY): Payer: Managed Care, Other (non HMO)

## 2011-11-20 ENCOUNTER — Other Ambulatory Visit: Payer: Self-pay

## 2011-11-20 ENCOUNTER — Other Ambulatory Visit (HOSPITAL_COMMUNITY): Payer: PRIVATE HEALTH INSURANCE

## 2011-11-20 ENCOUNTER — Emergency Department (HOSPITAL_COMMUNITY)
Admission: EM | Admit: 2011-11-20 | Discharge: 2011-11-20 | Disposition: A | Discharge status: Home Health | Payer: Managed Care, Other (non HMO) | Attending: Emergency Medicine | Admitting: Emergency Medicine

## 2011-11-20 ENCOUNTER — Encounter (HOSPITAL_COMMUNITY): Payer: Self-pay | Admitting: *Deleted

## 2011-11-20 DIAGNOSIS — H538 Other visual disturbances: Secondary | ICD-10-CM | POA: Insufficient documentation

## 2011-11-20 DIAGNOSIS — R51 Headache: Secondary | ICD-10-CM | POA: Insufficient documentation

## 2011-11-20 DIAGNOSIS — F17200 Nicotine dependence, unspecified, uncomplicated: Secondary | ICD-10-CM | POA: Insufficient documentation

## 2011-11-20 DIAGNOSIS — I10 Essential (primary) hypertension: Secondary | ICD-10-CM | POA: Insufficient documentation

## 2011-11-20 DIAGNOSIS — R5381 Other malaise: Secondary | ICD-10-CM | POA: Insufficient documentation

## 2011-11-20 DIAGNOSIS — R0602 Shortness of breath: Secondary | ICD-10-CM | POA: Insufficient documentation

## 2011-11-20 DIAGNOSIS — M79609 Pain in unspecified limb: Secondary | ICD-10-CM | POA: Insufficient documentation

## 2011-11-20 DIAGNOSIS — R42 Dizziness and giddiness: Secondary | ICD-10-CM | POA: Insufficient documentation

## 2011-11-20 DIAGNOSIS — R002 Palpitations: Secondary | ICD-10-CM | POA: Insufficient documentation

## 2011-11-20 DIAGNOSIS — R110 Nausea: Secondary | ICD-10-CM | POA: Insufficient documentation

## 2011-11-20 DIAGNOSIS — H81399 Other peripheral vertigo, unspecified ear: Secondary | ICD-10-CM

## 2011-11-20 DIAGNOSIS — Z79899 Other long term (current) drug therapy: Secondary | ICD-10-CM | POA: Insufficient documentation

## 2011-11-20 DIAGNOSIS — R269 Unspecified abnormalities of gait and mobility: Secondary | ICD-10-CM | POA: Insufficient documentation

## 2011-11-20 DIAGNOSIS — E669 Obesity, unspecified: Secondary | ICD-10-CM | POA: Insufficient documentation

## 2011-11-20 HISTORY — DX: Essential (primary) hypertension: I10

## 2011-11-20 LAB — URINALYSIS, ROUTINE W REFLEX MICROSCOPIC
Bilirubin Urine: NEGATIVE
Glucose, UA: NEGATIVE mg/dL
Hgb urine dipstick: NEGATIVE
Ketones, ur: NEGATIVE mg/dL
Leukocytes, UA: NEGATIVE
Nitrite: NEGATIVE
Protein, ur: NEGATIVE mg/dL
Specific Gravity, Urine: 1.008 (ref 1.005–1.030)
Urobilinogen, UA: 0.2 mg/dL (ref 0.0–1.0)
pH: 6.5 (ref 5.0–8.0)

## 2011-11-20 LAB — DIFFERENTIAL
Basophils Absolute: 0 10*3/uL (ref 0.0–0.1)
Basophils Relative: 0 % (ref 0–1)
Eosinophils Absolute: 0.1 10*3/uL (ref 0.0–0.7)
Eosinophils Relative: 1 % (ref 0–5)
Lymphocytes Relative: 31 % (ref 12–46)
Lymphs Abs: 1.5 10*3/uL (ref 0.7–4.0)
Monocytes Absolute: 0.4 10*3/uL (ref 0.1–1.0)
Monocytes Relative: 7 % (ref 3–12)
Neutro Abs: 2.9 10*3/uL (ref 1.7–7.7)
Neutrophils Relative %: 60 % (ref 43–77)

## 2011-11-20 LAB — CBC
HCT: 39.2 % (ref 36.0–46.0)
Hemoglobin: 12.6 g/dL (ref 12.0–15.0)
MCH: 27.5 pg (ref 26.0–34.0)
MCHC: 32.1 g/dL (ref 30.0–36.0)
MCV: 85.6 fL (ref 78.0–100.0)
Platelets: 380 10*3/uL (ref 150–400)
RBC: 4.58 MIL/uL (ref 3.87–5.11)
RDW: 13.8 % (ref 11.5–15.5)
WBC: 4.9 10*3/uL (ref 4.0–10.5)

## 2011-11-20 LAB — BASIC METABOLIC PANEL
BUN: 8 mg/dL (ref 6–23)
CO2: 27 mEq/L (ref 19–32)
Calcium: 10.1 mg/dL (ref 8.4–10.5)
Chloride: 104 mEq/L (ref 96–112)
Creatinine, Ser: 0.73 mg/dL (ref 0.50–1.10)
GFR calc Af Amer: >90 mL/min (ref >90)
GFR calc non Af Amer: >90 mL/min (ref >90)
Glucose, Bld: 142 mg/dL — ABNORMAL HIGH (ref 70–99)
Potassium: 3.4 mEq/L — ABNORMAL LOW (ref 3.5–5.1)
Sodium: 140 mEq/L (ref 135–145)

## 2011-11-20 LAB — TROPONIN I
Troponin I: <0.3 ng/mL (ref <0.30)
Troponin I: <0.3 ng/mL (ref <0.30)

## 2011-11-20 LAB — D-DIMER, QUANTITATIVE: D-Dimer, Quant: 0.62 ug/mL-FEU — ABNORMAL HIGH (ref 0.00–0.48)

## 2011-11-20 MED ORDER — SODIUM CHLORIDE 0.9 % IV BOLUS (SEPSIS)
1000.0000 mL | Freq: Once | INTRAVENOUS | Status: AC
  Administered 2011-11-20: 1000 mL via INTRAVENOUS

## 2011-11-20 MED ORDER — ONDANSETRON HCL 4 MG/2ML IJ SOLN
INTRAMUSCULAR | Status: AC
  Administered 2011-11-20: 4 mg
  Filled 2011-11-20: qty 2

## 2011-11-20 MED ORDER — IOHEXOL 300 MG/ML  SOLN
100.0000 mL | Freq: Once | INTRAMUSCULAR | Status: AC | PRN
  Administered 2011-11-20: 100 mL via INTRAVENOUS

## 2011-11-20 MED ORDER — ACETAMINOPHEN 325 MG PO TABS
650.0000 mg | ORAL_TABLET | Freq: Once | ORAL | Status: AC
  Administered 2011-11-20: 650 mg via ORAL
  Filled 2011-11-20: qty 2

## 2011-11-20 MED ORDER — MECLIZINE HCL 25 MG PO TABS
25.0000 mg | ORAL_TABLET | Freq: Once | ORAL | Status: AC
  Administered 2011-11-20: 25 mg via ORAL
  Filled 2011-11-20: qty 1

## 2011-11-20 MED ORDER — MECLIZINE HCL 25 MG PO TABS: 25.0000 mg | ORAL_TABLET | Freq: Three times a day (TID) | ORAL | Status: AC | PRN

## 2011-11-20 MED ORDER — IOHEXOL 350 MG/ML SOLN
100.0000 mL | Freq: Once | INTRAVENOUS | Status: AC | PRN
  Administered 2011-11-20: 100 mL via INTRAVENOUS

## 2011-11-20 NOTE — ED Provider Notes (Signed)
Medical screening examination/treatment/procedure(s) were conducted as a shared visit with non-physician practitioner(s) and myself.  I personally evaluated the patient during the encounter  Cyndra Numbers, MD 11/20/11 2250

## 2011-11-20 NOTE — ED Notes (Signed)
Pt undressed, in gown, on monitor, continuous pulse oximetry and blood pressure cuff; EKG performed in triage; blankets given

## 2011-11-20 NOTE — ED Notes (Signed)
Pt reports (L) arm pain radiates up into her (L) shoulder and neck, increase pain w/movement

## 2011-11-20 NOTE — ED Notes (Signed)
Patient transported to X-ray 

## 2011-11-20 NOTE — ED Provider Notes (Signed)
5:36 PM Patient is in CDU holding for CT head, MRI brain, CT angio chest.  Sign out received from [facility], PA-C.  Patient with vague complaints of dizziness, chest discomfort and shortness of breath.  At present, patient reports mild headache, is otherwise asymptomatic.  No other needs at this time.  I have ordered meclizine per my conversation with Thayer Ohm as well as tylenol for the headache.    6:33 PM Pt states she does have some mild dizziness still.  Taking meclizine now.  I have discussed Brain MRI, Head CT results with her.  Patient now pending CT angio chest.   7:48 PM CT angio is negative for PE.  Discussed results with patient.  Patient states she feels some improvement with meclizine.  Plan is for ambulation.  If able to ambulate without difficulty, d/c home with dx vertigo, meclizine Rx, PCP follow up.  Patient verbalizes understanding and agrees with plan.    Dillard Cannon [place], [place] 11/20/11 1949

## 2011-11-20 NOTE — ED Notes (Signed)
Pt c/o (L) arm pain and feeling disoriented for weeks, pt reports her pcp recently changed her bp meds, she first noticed the arm pain when she was reaching for her seatbelt several wks ago, sob starting this am, pt reports she was sitting down drinking coffee this am, felt like she couldn't move, states "I felt like I was on the ocean,"  And unable to focus. Denies LOC, chest pain, N/V, abd/back pain. Pt reports a warm sensation while at work this am and then asked an RN to take her BP, it was elevated, pt called her pcp and they instructed her to come to ED. Pt's pcp changed her BP med from HCTZ to Tribenzor 6 months ago, then decreased her Tribenzor 3 wks ago. Pt does not have a regular cardiologist. Pt also reports intermittent frontal headaches, no headache today but states "yesterday I had a bad one."

## 2011-11-20 NOTE — ED Notes (Signed)
Discharge instructions reviewed; pt verbalized understanding.  No questions asked; No further c/o's voiced.  Pt ambulatory to lobby.  NAD noted.

## 2011-11-20 NOTE — Discharge Instructions (Signed)
Please followup with your primary care provider.  You may return to the emergency department at any time for worsening condition or any new symptoms that concern you.   Vertigo Vertigo means you feel like you or your surroundings are moving when they are not. Vertigo can be dangerous if it occurs when you are at work, driving, or performing difficult activities.  CAUSES  Vertigo occurs when there is a conflict of signals sent to your brain from the visual and sensory systems in your body. There are many different causes of vertigo, including:  Infections, especially in the inner ear.   A bad reaction to a drug or misuse of alcohol and medicines.   Withdrawal from drugs or alcohol.   Rapidly changing positions, such as lying down or rolling over in bed.   A migraine headache.   Decreased blood flow to the brain.   Increased pressure in the brain from a head injury, infection, tumor, or bleeding.  SYMPTOMS  You may feel as though the world is spinning around or you are falling to the ground. Because your balance is upset, vertigo can cause nausea and vomiting. You may have involuntary eye movements (nystagmus). DIAGNOSIS  Vertigo is usually diagnosed by physical exam. If the cause of your vertigo is unknown, your caregiver may perform imaging tests, such as an MRI scan (magnetic resonance imaging). TREATMENT  Most cases of vertigo resolve on their own, without treatment. Depending on the cause, your caregiver may prescribe certain medicines. If your vertigo is related to body position issues, your caregiver may recommend movements or procedures to correct the problem. In rare cases, if your vertigo is caused by certain inner ear problems, you may need surgery. HOME CARE INSTRUCTIONS   Follow your caregiver's instructions.   Avoid driving.   Avoid operating heavy machinery.   Avoid performing any tasks that would be dangerous to you or others during a vertigo episode.   Tell your  caregiver if you notice that certain medicines seem to be causing your vertigo. Some of the medicines used to treat vertigo episodes can actually make them worse in some people.  SEEK IMMEDIATE MEDICAL CARE IF:   Your medicines do not relieve your vertigo or are making it worse.   You develop problems with talking, walking, weakness, or using your arms, hands, or legs.   You develop severe headaches.   Your nausea or vomiting continues or gets worse.   You develop visual changes.   A family member notices behavioral changes.   Your condition gets worse.  MAKE SURE YOU:  Understand these instructions.   Will watch your condition.   Will get help right away if you are not doing well or get worse.  Document Released: 06/28/2005 Document Revised: 05/31/2011 Document Reviewed: 04/06/2011 [facility] Patient Information 2012 [place], [place].

## 2011-11-20 NOTE — ED Notes (Signed)
Care assumed. Pt A/A/O X 3. Skin warm and dry,resp even and unlabored.color pink.states still with pain in midsternal area 2/10. Transport here for pt to [facility].

## 2011-11-20 NOTE — ED Provider Notes (Signed)
History     CSN: [id number]  Arrival date & time 11/20/11  1011   First MD Initiated Contact with Patient 11/20/11 1024      Chief Complaint  Patient presents with  . Hypertension    (Consider location/radiation/quality/duration/timing/severity/associated sxs/prior treatment) HPI Patient is a 56 yo [place] American female with a history of HTN that presents to the ED complaining of increased blood pressure, lightheadedness, and general malaise.  She reports that after having a cigarette and her coffee this morning she felt like she was in a "trance" where she knew where she was but she could not move her body; she state she feels "flustered" "disoriented" and like her "equilibrium is off" . This lasted for approximately 2-3 minutes. She was then able to get ready and come to work.  While at work, she started feeling like her heart was racing and her blood pressure was increasing so she had the nurse check it for her, and it was 170's/104. Then encouraged her to come to the ED for evaluation. Patient reports that she has had GI "issues" for some time, so she feels nauseous a lot, but has not vomited or had diarrhea. She also complains of L arm pain, but this has been ongoing for months and has been worked up by her PCP. Patient also complains of urinary "pressure" but denies dysuria or frequency. Patient reports palpitations this morning, and some shortness of breath and dizziness that is like "waves" but not spinning; increased headache vision blurriness over the past 1-2 weeks. Patient denies fever, chills, sweats, chest pain, swelling/pain in lower extremities. Patient has no known drug allergies.  Past Medical History  Diagnosis Date  . Hypertension     Past Surgical History  Procedure Date  . Mastectomy, partial   . Abdominal hysterectomy   . Bladder suspension   . Oophorectomy     No family history on file.  History  Substance Use Topics  . Smoking status: Current Everyday  Smoker  . Smokeless tobacco: Not on file  . Alcohol Use: No    OB History    Grav Para Term Preterm Abortions TAB SAB Ect Mult Living                  Review of Systems All pertinent positives and negatives reviewed in the history of present illness  Allergies  Review of patient's allergies indicates no known allergies.  Home Medications   Current Outpatient Rx  Name Route Sig Dispense Refill  . IBUPROFEN 200 MG PO TABS Oral Take 400 mg by mouth every 6 (six) hours as needed. For pain    . OLMESARTAN-AMLODIPINE-HCTZ 40-5-25 MG PO TABS Oral Take 1 tablet by mouth daily.    Marland Kitchen PREGABALIN 75 MG PO CAPS Oral Take 150 mg by mouth daily.      BP 139/108  Pulse 72  Temp(Src) 98.6 F (37 C) (Oral)  Resp 18  Ht 5\' 6"  (1.676 m)  SpO2 98%  Physical Exam  Constitutional: She is oriented to person, place, and time. She appears well-developed and well-nourished. No distress.  HENT:  Head: Normocephalic and atraumatic.  Mouth/Throat: Oropharynx is clear and moist. No oropharyngeal exudate.  Eyes: Pupils are equal, round, and reactive to light.  Neck: Normal range of motion. Neck supple.  Cardiovascular: Normal rate, regular rhythm and normal heart sounds.   Pulmonary/Chest: Effort normal and breath sounds normal. No respiratory distress. She has no wheezes. She exhibits no tenderness.  Abdominal: Soft. Bowel  sounds are normal. She exhibits no distension and no mass. There is tenderness (mild diffuse tenderness across entire abdomen). There is no rebound and no guarding.  Lymphadenopathy:    She has no cervical adenopathy.  Neurological: She is alert and oriented to person, place, and time.  Skin: Skin is warm and dry. She is not diaphoretic.    ED Course  Procedures (including critical care time)   Labs Reviewed  BASIC METABOLIC PANEL  CBC  DIFFERENTIAL  URINALYSIS, ROUTINE W REFLEX MICROSCOPIC    The patient has high blood pressure, obesity and she smokes daily therefore  there is a raised concern for vascular disease. The patient has complained of SOB as well as weakness, dizziness (sensation of movement), gait issues. Will have an MR done to evaluate the posterior circulation for any possibility of stroke. The patient will have CTA done due to her SOB along with her elevate D-dimer.      MDM   Date: 11/20/2011 10:19  Rate: 84  Rhythm: normal sinus rhythm  QRS Axis: normal  Intervals: normal  ST/T Wave abnormalities: nonspecific ST/T changes  Conduction Disutrbances:none  Narrative Interpretation:   Old EKG Reviewed: unchanged   Date: 11/20/2011 13:24  Rate: 63  Rhythm: normal sinus rhythm  QRS Axis: normal  Intervals: normal  ST/T Wave abnormalities: nonspecific T wave changes  Conduction Disutrbances:none  Narrative Interpretation:   Old EKG Reviewed: unchanged   MDM Reviewed: nursing note, vitals and previous chart Interpretation: labs, ECG, x-ray, MRI and CT scan               Carlyle Dolly, PA-C 11/20/11 1602

## 2011-11-20 NOTE — ED Provider Notes (Signed)
Medical screening examination/treatment/procedure(s) were conducted as a shared visit with non-physician practitioner(s) and myself.  I personally evaluated the patient during the encounter  Chasitie Passey, MD 11/20/11 2249 

## 2011-11-20 NOTE — ED Notes (Signed)
Patient reports her bp medication was changed 3 weeks ago.  She states today she noted she feels disoriented.  She has left arm pain and she does not feel well at all

## 2011-11-20 NOTE — ED Notes (Signed)
CDU RN will call this RN with bed available.

## 2011-12-20 ENCOUNTER — Encounter (HOSPITAL_COMMUNITY): Payer: Self-pay

## 2011-12-20 ENCOUNTER — Ambulatory Visit (HOSPITAL_COMMUNITY)
Admission: RE | Admit: 2011-12-20 | Discharge: 2011-12-20 | Disposition: A | Discharge status: Home / Self Care | Payer: Managed Care, Other (non HMO) | Source: Ambulatory Visit | Attending: Internal Medicine | Admitting: Internal Medicine

## 2011-12-20 VITALS — BP 180/102 | HR 65 | Wt 220.8 lb

## 2011-12-20 DIAGNOSIS — R0609 Other forms of dyspnea: Secondary | ICD-10-CM | POA: Insufficient documentation

## 2011-12-20 DIAGNOSIS — I10 Essential (primary) hypertension: Secondary | ICD-10-CM

## 2011-12-20 DIAGNOSIS — R0600 Dyspnea, unspecified: Secondary | ICD-10-CM

## 2011-12-20 DIAGNOSIS — R0989 Other specified symptoms and signs involving the circulatory and respiratory systems: Secondary | ICD-10-CM | POA: Insufficient documentation

## 2011-12-20 MED ORDER — SPIRONOLACTONE 25 MG PO TABS: 25.0000 mg | ORAL_TABLET | Freq: Every day | ORAL | Status: DC

## 2011-12-20 MED ORDER — AMLODIPINE BESYLATE 10 MG PO TABS: 10.0000 mg | ORAL_TABLET | Freq: Every day | ORAL | Status: DC

## 2011-12-20 NOTE — Progress Notes (Signed)
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 Who presents today for management of her HTN.  Has a h/o breast CA in 2011. Treated with partial mastectomy/chemo/XRT. Denies any h/o known heart disease. Has never had a stress test, echo or cath. Does have a h/o palpitations in the past. Has not worn a monitor.   Active at work but doesn't exercise regularly. Gets short of breath with walking stairs. No CP, orthopnea or PND. Has gained 15 pounds recently.  Has had HTN for about 6 years. Started on HCTZ without much effect. About 6 months ago switched to [employer]. Has helped some but SBP still runs in 160 range. Was getting samples but now has to pay for meds and costs > $160/month and can't afford. Has been out of all meds for 3 weeks.   Doesn't add salt to foods but doesn't follow sodium in foods. Likes salads. But really likes [employer]. Doesn't eat many canned foods. Occasionally eats chips. Has been told that she snores severely at night. Very fatigued. +dependent edema. No orthopnea or PND. + occasional ab distension Smokes 1/3 ppd. Takes a lot of ibuprofen for arthritis in knees.   K 3.4 Cr 0.73. In February 2013  Review of Systems:     Cardiac Review of Systems: {Y] = yes [ ]  = no  Chest Pain [    ]  Resting SOB [   ] Exertional SOB  [ y]  Orthopnea [  ]   Pedal Edema [  y]    Palpitations [ y] Syncope  [  ]   Presyncope [ y ]  General Review of Systems: [Y] = yes [  ]=no Constitional: recent weight change [  ]; anorexia [  ]; fatigue [ y]; nausea [  ]; night sweats [  ]; fever [  ]; or chills [  ];                                                                          Eye : blurred vision [  ]; diplopia [   ]; vision changes [  ];  Amaurosis fugax[  ]; Resp: cough [  ];  wheezing[  ];  hemoptysis[  ]; shortness of breath[y ]; paroxysmal nocturnal dyspnea[  ]; dyspnea on exertion[y ]; or orthopnea[  ];  GI:  gallstones[  ],  vomiting[  ];  dysphagia[  ]; melena[  ];  hematochezia [  ]; heartburn[  ];   Hx of  Colonoscopy[  ]; GU: kidney stones [  ]; hematuria[  ];   dysuria [  ];  nocturia[  ];  history of     obstruction [  ];                 Skin: rash, swelling[  ];, hair loss[  ];  peripheral edema[  ];  or itching[  ]; Musculosketetal: myalgias[  ];  joint swelling[  ];  joint erythema[  ];  joint pain[y ];  back pain[  ];  Heme/Lymph: bruising[  ];  bleeding[  ];  anemia[  ];  Neuro: TIA[  ];  headaches[  ];  stroke[  ];  vertigo[  ];  seizures[  ];   paresthesias[  ];  difficulty walking[  ];  Psych:depression[  ]; anxiety[  ];  Endocrine: diabetes[  ];  thyroid dysfunction[  ];  Immunizations: Flu [  ]; Pneumococcal[  ];  Other:    Past Medical History  Diagnosis Date  . Hypertension     Current Outpatient Prescriptions  Medication Sig Dispense Refill  . ibuprofen (ADVIL,MOTRIN) 200 MG tablet Take 400 mg by mouth every 6 (six) hours as needed. For pain      . Olmesartan-Amlodipine-HCTZ (TRIBENZOR) 40-5-25 MG TABS Take 1 tablet by mouth daily.      . pregabalin (LYRICA) 75 MG capsule Take 150 mg by mouth daily.         No Known Allergies  History   Social History  . Marital Status: Legally Separated    Spouse Name: N/A    Number of Children: N/A  . Years of Education: N/A   Occupational History  . Not on file.   Social History Main Topics  . Smoking status: Current Everyday Smoker  . Smokeless tobacco: Not on file  . Alcohol Use: No  . Drug Use: No  . Sexually Active:    Other Topics Concern  . Not on file   Social History Narrative  . No narrative on file   Family History: Adopted. Doesn't know family history except birth mother very obese.Now deceased.   PHYSICAL EXAM: Filed Vitals:   12/20/11 1254  BP: 180/102  Pulse: 65  Weight: 220 lb 12 oz (100.132 kg)  SpO2: 100%   General:  Well appearing. No respiratory difficulty HEENT: normal Neck: supple. no JVD. Carotids  2+ bilat; no bruits. No lymphadenopathy or thryomegaly appreciated. Cor: PMI nondisplaced. Regular rate & rhythm. No rubs, gallops or murmurs. Lungs: clear Abdomen: obese soft, nontender, nondistended. No hepatosplenomegaly. No bruits or masses. Good bowel sounds. Extremities: no cyanosis, clubbing, rash, edema Neuro: alert & oriented x 3, cranial nerves grossly intact. moves all 4 extremities w/o difficulty. Affect pleasant.  ECG:  (february 2013) SR 84. LAE. No ST-T wave abnormalities.    No results found for this or any previous visit (from the past 24 hour(s)). No results found.   ASSESSMENT & PLAN:

## 2011-12-20 NOTE — Patient Instructions (Signed)
Stop Tribenzor   Start Amlodipine 10 mg 1/2 tab daily  Start Spironolactone 25 mg  Labs next week, Wednesday between 8:30-1:30 or 2:30-4:30 at [facility] have been referred to Pulmonologist  Your physician has requested that you have an echocardiogram. Echocardiography is a painless test that uses sound waves to create images of your heart. It provides your doctor with information about the size and shape of your heart and how well your heart's chambers and valves are working. This procedure takes approximately one hour. There are no restrictions for this procedure.  Your physician recommends that you schedule a follow-up appointment in: 3-4 weeks

## 2011-12-31 DIAGNOSIS — R0600 Dyspnea, unspecified: Secondary | ICD-10-CM | POA: Insufficient documentation

## 2011-12-31 DIAGNOSIS — I10 Essential (primary) hypertension: Secondary | ICD-10-CM | POA: Insufficient documentation

## 2011-12-31 NOTE — Assessment & Plan Note (Signed)
BP markedly elevated. Likely multifactorial. Unable to afford Tribenzor. Will start Amlodipine and spironolactone as first-line. Will likely need continued titration. Stressed need for salt restriction and weight loss.

## 2011-12-31 NOTE — Assessment & Plan Note (Signed)
Likely multifactorial due to combination of obesity, deconditioning, probable diastolic dysfunction and possible OAS and COPD.Will check echo and refer to pulmonary for PFTs and sleep study. Stressed need for weight loss and smoking cessation.

## 2012-01-03 ENCOUNTER — Ambulatory Visit (HOSPITAL_COMMUNITY): Payer: Commercial Indemnity | Attending: Internal Medicine

## 2012-01-08 ENCOUNTER — Institutional Professional Consult (permissible substitution): Payer: Commercial Indemnity | Admitting: Pulmonary Disease

## 2012-01-16 ENCOUNTER — Ambulatory Visit (HOSPITAL_COMMUNITY): Payer: Managed Care, Other (non HMO) | Attending: Internal Medicine

## 2012-09-05 ENCOUNTER — Ambulatory Visit (INDEPENDENT_AMBULATORY_CARE_PROVIDER_SITE_OTHER): Payer: Managed Care, Other (non HMO) | Admitting: Pulmonary Disease

## 2012-09-05 ENCOUNTER — Encounter: Payer: Self-pay | Admitting: Pulmonary Disease

## 2012-09-05 VITALS — BP 130/84 | HR 79 | Temp 97.7°F | Ht 66.0 in | Wt 220.4 lb

## 2012-09-05 DIAGNOSIS — G4733 Obstructive sleep apnea (adult) (pediatric): Secondary | ICD-10-CM | POA: Insufficient documentation

## 2012-09-05 NOTE — Assessment & Plan Note (Signed)
She reports snoring, apnea, sleep disruption, and daytime sleepiness.  She has history of hypertension on several medications.  I am concerned she could have sleep apnea.  I have explained how sleep apnea can affect the patient's health.  Driving precautions and importance of weight loss were discussed.  Treatment options for sleep apnea were reviewed.  To further assess will arrange for home sleep study.  I explained that there may be barrier with insurance coverage for home sleep study.  If this is so, then she will need to have in lab study.

## 2012-09-05 NOTE — Patient Instructions (Signed)
Will arrange for home sleep study Will call to schedule follow up after home sleep study reviewed 

## 2012-09-05 NOTE — Progress Notes (Deleted)
  Subjective:    Patient ID: [name], female    DOB: 03/17/1956, 56 y.o.   MRN: [id number]  HPI    Review of Systems  Constitutional: Negative for fever and unexpected weight change.  HENT: Negative for ear pain, nosebleeds, congestion, sore throat, rhinorrhea, sneezing, trouble swallowing, dental problem, postnasal drip and sinus pressure.   Eyes: Negative for redness and itching.  Respiratory: Positive for cough. Negative for chest tightness, shortness of breath and wheezing.   Cardiovascular: Positive for leg swelling. Negative for palpitations.  Gastrointestinal: Negative for nausea and vomiting.  Genitourinary: Negative for dysuria.  Musculoskeletal: Negative for joint swelling.  Skin: Negative for rash.  Neurological: Positive for headaches.  Hematological: Does not bruise/bleed easily.  Psychiatric/Behavioral: Negative for dysphoric mood. The patient is not nervous/anxious.        Objective:   Physical Exam        Assessment & Plan:

## 2012-09-05 NOTE — Progress Notes (Signed)
Chief Complaint  Patient presents with  . sleep consult    pt referred by DR Bensimohn     History of Present Illness: [name] is a 56 y.o. female for evaluation of sleep problems.  She is followed by cardiology for her hypertension.  She on several medicines for this.  During recently evaluation there was concern about her sleep pattern.  As a result she was referred to pulmonary/sleep medicine.  She has been told that she snores, and this has been getting worse.  She wakes up with a choking sensation, and she gets a sore throat in the mornings.  She always feels tired.  She goes to bed at 10:30.  She has been using OTC sleep aide for the past year.  This helps her fall asleep, but not stay asleep.  She wakes up several times to use the bathroom.  Sometimes she can't fall back to sleep, and will lay in bed until she does go back to sleep.  She wakes up at 6 am on weekdays for work, but sleeps in until 730 am on weekends.    She denies morning headaches.  She drinks 2 cups of coffee in the morning.  She does not nap, but would if she had time to.  She used to grind her teeth as a child, but has not been told she does this anymore.  She does talk in her sleep.  She dreams frequently, and wakes up at times during her dreams.  Epworth score is 0 out of 24.  The patient denies sleep walking, or nightmares.  There is no history of restless legs.  The patient denies sleep hallucinations, sleep paralysis, or cataplexy.  Tests:  Past Medical History  Diagnosis Date  . Hypertension     Past Surgical History  Procedure Date  . Mastectomy, partial   . Abdominal hysterectomy   . Bladder suspension   . Oophorectomy     Current Outpatient Prescriptions on File Prior to Visit  Medication Sig Dispense Refill  . amLODipine (NORVASC) 10 MG tablet Take 1 tablet (10 mg total) by mouth daily.  30 tablet  6  . ibuprofen (ADVIL,MOTRIN) 200 MG tablet Take 400 mg by mouth every 6 (six)  hours as needed. For pain      . pregabalin (LYRICA) 75 MG capsule Take 150 mg by mouth daily.      Marland Kitchen spironolactone (ALDACTONE) 25 MG tablet Take 1 tablet (25 mg total) by mouth daily.  30 tablet  6  . [DISCONTINUED] Olmesartan-Amlodipine-HCTZ (TRIBENZOR) 40-5-25 MG TABS Take 1 tablet by mouth daily.        No Known Allergies  No family history on file.  History  Substance Use Topics  . Smoking status: Current Every Day Smoker -- 0.5 packs/day for 20 years    Types: Cigarettes  . Smokeless tobacco: Never Used  . Alcohol Use: No    Review of Systems  Constitutional: Negative for fever and unexpected weight change.  HENT: Negative for ear pain, nosebleeds, congestion, sore throat, rhinorrhea, sneezing, trouble swallowing, dental problem, postnasal drip and sinus pressure.   Eyes: Negative for redness and itching.  Respiratory: Positive for cough. Negative for chest tightness, shortness of breath and wheezing.   Cardiovascular: Positive for leg swelling. Negative for palpitations.  Gastrointestinal: Negative for nausea and vomiting.  Genitourinary: Negative for dysuria.  Musculoskeletal: Negative for joint swelling.  Skin: Negative for rash.  Neurological: Positive for headaches.  Hematological: Does not bruise/bleed easily.  Psychiatric/Behavioral: Negative for dysphoric mood. The patient is not nervous/anxious.    Physical Exam: Filed Vitals:   09/05/12 1555  BP: 130/84  Pulse: 79  Temp: 97.7 F (36.5 C)  TempSrc: Oral  Height: 5\' 6"  (1.676 m)  Weight: 220 lb 6.4 oz (99.973 kg)  SpO2: 95%  ,  Current Encounter SPO2  09/05/12 1555 95%  12/20/11 1254 100%  11/20/11 1925 100%  11/20/11 1815 100%  11/20/11 1730 100%  11/20/11 1530 96%  11/20/11 1515 96%  11/20/11 1500 96%  11/20/11 1445 97%  11/20/11 1430 93%  11/20/11 1415 94%  11/20/11 1400 95%  11/20/11 1345 97%  11/20/11 1330 91%  11/20/11 1315 97%  11/20/11 1245 94%  11/20/11 1240 87%  11/20/11 1230 96%   11/20/11 1200 95%  11/20/11 1145 95%  11/20/11 1130 90%  11/20/11 1115 98%  11/20/11 1100 96%  11/20/11 1045 98%  11/20/11 1019 97%    Wt Readings from Last 3 Encounters:  09/05/12 220 lb 6.4 oz (99.973 kg)  12/20/11 220 lb 12 oz (100.132 kg)    Body mass index is 35.57 kg/(m^2).   General - No distress ENT - No sinus tenderness, no oral exudate, MP 3, 2+ tonsils, no LAN, no thyromegaly, TM clear, pupils equal/reactive Cardiac - s1s2 regular, no murmur, pulses symmetric Chest - No wheeze/rales/dullness, good air entry, normal respiratory excursion Back - No focal tenderness Abd - Soft, non-tender, no organomegaly, + bowel sounds Ext - No edema Neuro - Normal strength, cranial nerves intact Skin - No rashes Psych - Normal mood, and behavior   Assessment/Plan:  Coralyn Helling, MD [facility] Pager:  [phone] 09/05/2012, 4:07 PM

## 2012-09-12 ENCOUNTER — Ambulatory Visit (HOSPITAL_COMMUNITY): Payer: Managed Care, Other (non HMO)

## 2012-09-12 ENCOUNTER — Encounter (HOSPITAL_COMMUNITY): Payer: Managed Care, Other (non HMO)

## 2012-10-21 ENCOUNTER — Encounter (HOSPITAL_COMMUNITY): Payer: Self-pay

## 2012-10-21 ENCOUNTER — Ambulatory Visit (HOSPITAL_COMMUNITY)
Admission: RE | Admit: 2012-10-21 | Discharge: 2012-10-21 | Disposition: A | Discharge status: Home / Self Care | Payer: No Typology Code available for payment source | Source: Ambulatory Visit | Attending: Internal Medicine | Admitting: Internal Medicine

## 2012-10-21 ENCOUNTER — Ambulatory Visit (HOSPITAL_BASED_OUTPATIENT_CLINIC_OR_DEPARTMENT_OTHER)
Admission: RE | Admit: 2012-10-21 | Discharge: 2012-10-21 | Disposition: A | Discharge status: Home / Self Care | Payer: No Typology Code available for payment source | Source: Ambulatory Visit | Attending: Internal Medicine | Admitting: Internal Medicine

## 2012-10-21 VITALS — BP 144/92 | HR 79 | Ht 66.0 in | Wt 219.8 lb

## 2012-10-21 DIAGNOSIS — I10 Essential (primary) hypertension: Secondary | ICD-10-CM | POA: Insufficient documentation

## 2012-10-21 DIAGNOSIS — R0600 Dyspnea, unspecified: Secondary | ICD-10-CM

## 2012-10-21 DIAGNOSIS — G4733 Obstructive sleep apnea (adult) (pediatric): Secondary | ICD-10-CM

## 2012-10-21 DIAGNOSIS — I517 Cardiomegaly: Secondary | ICD-10-CM

## 2012-10-21 MED ORDER — SPIRONOLACTONE 25 MG PO TABS: 25.0000 mg | ORAL_TABLET | Freq: Every day | ORAL | Status: DC

## 2012-10-21 NOTE — Progress Notes (Signed)
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 Who presents today for management of her HTN.  She has h/o breast CA in 2011. Treated with partial mastectomy/chemo/XRT. Denies any h/o known heart disease. Has never had a stress test, echo or cath. Does have a h/o palpitations in the past. Has not worn a monitor.   Has had HTN for about 6 years. Started on HCTZ without much effect therefore changed to [employer].  But cost was too much and she stopped.    Echo 10/21/12: EF 60-65% (vigorous) Mid cavity obliteration.  Mild-mod LVH.  Grade I diastolic dysfunction.    She returns for follow up today.  She rescheduled her last follow up and sleep study due to insurance/financial issues.  She also stopped taking spironolactone several months ago as she had no more refills.  She does not follow her BP at home, only at the doctor.  Takes ibuprofen several times daily due to pain all over.    Review of Systems: All pertinent positives and negatives as in HPI, otherwise negative.    Past Medical History  Diagnosis Date  . Hypertension     Current Outpatient Prescriptions  Medication Sig Dispense Refill  . amLODipine (NORVASC) 10 MG tablet Take 5 mg by mouth daily.      Marland Kitchen ibuprofen (ADVIL,MOTRIN) 200 MG tablet Take 400 mg by mouth every 6 (six) hours as needed. For pain      . [DISCONTINUED] Olmesartan-Amlodipine-HCTZ (TRIBENZOR) 40-5-25 MG TABS Take 1 tablet by mouth daily.        No Known Allergies  PHYSICAL EXAM: Filed Vitals:   10/21/12 1041  BP: 144/92  Pulse: 79  Height: 5\' 6"  (1.676 m)  Weight: 219 lb 12.8 oz (99.701 kg)  SpO2: 94%   General:  Well appearing. No respiratory difficulty HEENT: normal Neck: supple. no JVD. Carotids 2+ bilat; no bruits. No lymphadenopathy or thryomegaly appreciated. Cor: PMI nondisplaced. Regular rate & rhythm. No rubs, gallops or murmurs. Lungs: clear Abdomen: obese soft, nontender, nondistended. No  hepatosplenomegaly. No bruits or masses. Good bowel sounds. Extremities: no cyanosis, clubbing, rash, tr bilateral lower extremity edema Neuro: alert & oriented x 3, cranial nerves grossly intact. moves all 4 extremities w/o difficulty. Affect pleasant.   ASSESSMENT & PLAN:

## 2012-10-21 NOTE — Progress Notes (Signed)
  Echocardiogram 2D Echocardiogram has been performed.  Ellender Hose A 10/21/2012, 11:22 AM

## 2012-10-21 NOTE — Patient Instructions (Addendum)
Add spironolactone 25 mg daily.    Labs next week.  Follow up 6 months  Follow blood pressure at home

## 2012-10-21 NOTE — Assessment & Plan Note (Addendum)
Have encouraged her to call back and reschedule sleep study, she voices understanding.   Attending: Agree. Would also benefit from weight loss.

## 2012-10-21 NOTE — Assessment & Plan Note (Addendum)
Would like blood pressure <140.  In setting of mild edema will add back spironolactone 25 mg daily.  Have discussed need for medication compliance.  Will check BMET in 1 week and then in 1 month to ensure she does not become hyperkalemic.  Have asked her to get a blood pressure cuff and begin checking BP at home.    Patient seen and examined with Ulyess Blossom, PA-C. We discussed all aspects of the encounter. I agree with the assessment and plan as stated above. Echo reviewed personally. Agree with adding back spiro. Emphasized need for compliance. Check BMET 1 week and 1 month.

## 2012-10-29 ENCOUNTER — Other Ambulatory Visit: Payer: Self-pay

## 2012-11-21 ENCOUNTER — Other Ambulatory Visit: Payer: Self-pay

## 2012-12-20 ENCOUNTER — Other Ambulatory Visit (HOSPITAL_COMMUNITY): Payer: Self-pay | Admitting: Internal Medicine

## 2013-04-03 ENCOUNTER — Ambulatory Visit (HOSPITAL_COMMUNITY)
Admission: RE | Admit: 2013-04-03 | Discharge: 2013-04-03 | Disposition: A | Discharge status: Home / Self Care | Payer: No Typology Code available for payment source | Source: Ambulatory Visit | Attending: Internal Medicine | Admitting: Internal Medicine

## 2013-04-03 ENCOUNTER — Encounter (HOSPITAL_COMMUNITY): Payer: Self-pay

## 2013-04-03 VITALS — BP 130/94 | HR 89 | Wt 228.8 lb

## 2013-04-03 DIAGNOSIS — I10 Essential (primary) hypertension: Secondary | ICD-10-CM

## 2013-04-03 DIAGNOSIS — G4733 Obstructive sleep apnea (adult) (pediatric): Secondary | ICD-10-CM

## 2013-04-03 DIAGNOSIS — Z853 Personal history of malignant neoplasm of breast: Secondary | ICD-10-CM | POA: Insufficient documentation

## 2013-04-03 DIAGNOSIS — Z79899 Other long term (current) drug therapy: Secondary | ICD-10-CM | POA: Insufficient documentation

## 2013-04-03 MED ORDER — AMLODIPINE BESYLATE 10 MG PO TABS: 10.0000 mg | ORAL_TABLET | Freq: Every day | ORAL | Status: DC

## 2013-04-03 NOTE — Assessment & Plan Note (Signed)
Encouraged to obtain sleep study.

## 2013-04-03 NOTE — Patient Instructions (Addendum)
Follow up in 6 months  Take amlodipine 10 mg daily

## 2013-04-03 NOTE — Assessment & Plan Note (Addendum)
Reviewed most recent BMET. Renal function stable. DBP remains elevated. Increase amlodipine to 10 mg daily. Encouraged to exercise at least 15 minutes daily. Follow up in 6 months.

## 2013-04-03 NOTE — Progress Notes (Signed)
Patient ID: [name], female   DOB: [birth year], 57 y.o.   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 Who presents today for management of her HTN.  She has h/o breast CA in 2011. Treated with partial mastectomy/chemo/XRT.  HTN  Echo 10/21/12: EF 60-65% (vigorous) Mid cavity obliteration.  Mild-mod LVH.  Grade I diastolic dysfunction.    11/19/12 Potassium 3.4 Creatinine 0.73  She returns for follow up today. Last visit she was started back on Spironolactone 25 mg daily. Compliant with medications.  Complains of mild dsypnea with exertion. Plans to get sleep study at some point. Not exercising because she is tired.     Review of Systems: All pertinent positives and negatives as in HPI, otherwise negative.    Past Medical History  Diagnosis Date  . Hypertension     Current Outpatient Prescriptions  Medication Sig Dispense Refill  . amLODipine (NORVASC) 10 MG tablet Take 5 mg by mouth daily.      Marland Kitchen ibuprofen (ADVIL,MOTRIN) 200 MG tablet Take 400 mg by mouth every 6 (six) hours as needed. For pain      . spironolactone (ALDACTONE) 25 MG tablet Take 1 tablet (25 mg total) by mouth daily.  30 tablet  6  . [DISCONTINUED] Olmesartan-Amlodipine-HCTZ (TRIBENZOR) 40-5-25 MG TABS Take 1 tablet by mouth daily.       No current facility-administered medications for this encounter.    No Known Allergies  PHYSICAL EXAM: Filed Vitals:   04/03/13 0932  BP: 130/94  Pulse: 89  Weight: 228 lb 12.8 oz (103.783 kg)  SpO2: 99%   General:  Well appearing. No respiratory difficulty HEENT: normal Neck: supple. no JVD. Carotids 2+ bilat; no bruits. No lymphadenopathy or thryomegaly appreciated. Cor: PMI nondisplaced. Regular rate & rhythm. No rubs, gallops or murmurs. Lungs: clear Abdomen: obese soft, nontender, nondistended. No hepatosplenomegaly. No bruits or masses. Good bowel sounds. Extremities: no cyanosis, clubbing,  rash, tr bilateral lower extremity edema Neuro: alert & oriented x 3, cranial nerves grossly intact. moves all 4 extremities w/o difficulty. Affect pleasant.   ASSESSMENT & PLAN:

## 2013-05-01 ENCOUNTER — Encounter: Payer: Self-pay | Admitting: Gastroenterology

## 2013-05-22 ENCOUNTER — Other Ambulatory Visit (HOSPITAL_COMMUNITY): Payer: Self-pay | Admitting: Physician Assistant

## 2013-08-07 ENCOUNTER — Other Ambulatory Visit: Payer: Self-pay

## 2013-09-17 ENCOUNTER — Other Ambulatory Visit: Payer: Self-pay | Admitting: Pulmonary Disease

## 2013-09-17 DIAGNOSIS — Z9889 Other specified postprocedural states: Secondary | ICD-10-CM

## 2013-09-17 DIAGNOSIS — Z853 Personal history of malignant neoplasm of breast: Secondary | ICD-10-CM

## 2013-09-18 ENCOUNTER — Other Ambulatory Visit: Payer: Self-pay | Admitting: Pulmonary Disease

## 2013-09-18 DIAGNOSIS — N6310 Unspecified lump in the right breast, unspecified quadrant: Secondary | ICD-10-CM

## 2013-09-18 DIAGNOSIS — Z9889 Other specified postprocedural states: Secondary | ICD-10-CM

## 2013-09-18 DIAGNOSIS — Z853 Personal history of malignant neoplasm of breast: Secondary | ICD-10-CM

## 2013-09-24 ENCOUNTER — Ambulatory Visit
Admission: RE | Admit: 2013-09-24 | Discharge: 2013-09-24 | Disposition: A | Discharge status: Home / Self Care | Payer: PRIVATE HEALTH INSURANCE | Source: Ambulatory Visit | Attending: Pulmonary Disease | Admitting: Pulmonary Disease

## 2013-09-24 DIAGNOSIS — Z9889 Other specified postprocedural states: Secondary | ICD-10-CM

## 2013-09-24 DIAGNOSIS — Z853 Personal history of malignant neoplasm of breast: Secondary | ICD-10-CM

## 2013-09-24 DIAGNOSIS — N6310 Unspecified lump in the right breast, unspecified quadrant: Secondary | ICD-10-CM

## 2013-10-29 ENCOUNTER — Ambulatory Visit (HOSPITAL_COMMUNITY)
Admission: RE | Admit: 2013-10-29 | Discharge: 2013-10-29 | Disposition: A | Discharge status: Home / Self Care | Payer: PRIVATE HEALTH INSURANCE | Source: Ambulatory Visit | Attending: Internal Medicine | Admitting: Internal Medicine

## 2013-10-29 ENCOUNTER — Encounter (HOSPITAL_COMMUNITY): Payer: Self-pay

## 2013-10-29 VITALS — BP 126/74 | HR 87 | Wt 229.8 lb

## 2013-10-29 DIAGNOSIS — I10 Essential (primary) hypertension: Secondary | ICD-10-CM

## 2013-10-29 LAB — BASIC METABOLIC PANEL
BUN: 10 mg/dL (ref 6–23)
CALCIUM: 9.7 mg/dL (ref 8.4–10.5)
CO2: 21 mEq/L (ref 19–32)
Chloride: 100 mEq/L (ref 96–112)
Creatinine, Ser: 0.75 mg/dL (ref 0.50–1.10)
GFR calc Af Amer: >90 mL/min (ref >90)
GLUCOSE: 220 mg/dL — AB (ref 70–99)
Potassium: 3.9 mEq/L (ref 3.7–5.3)
Sodium: 138 mEq/L (ref 137–147)

## 2013-10-29 MED ORDER — POTASSIUM CHLORIDE ER 20 MEQ PO TBCR: 20.0000 meq | EXTENDED_RELEASE_TABLET | ORAL | Status: DC | PRN

## 2013-10-29 MED ORDER — FUROSEMIDE 20 MG PO TABS: 20.0000 mg | ORAL_TABLET | ORAL | Status: DC | PRN

## 2013-10-29 NOTE — Progress Notes (Signed)
Patient ID: HONI NAME, female   DOB: Jan 15, 1956, 58 y.o.   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  She has HTN and h/o breast CA in 2011. Treated with partial mastectomy/chemo/XRT.   We have followed her for management of her HTN.   Echo 10/21/12: EF 60-65% (vigorous) Mid cavity obliteration.  Mild-mod LVH.  Grade I diastolic dysfunction.    11/20/11 Potassium 3.4 Creatinine 0.73  She returns for follow up today. Quit smoking 2 weeks ago. Last visit amlodipine 10 mg daily started. Denies SOB/Orthopnea. Compliant with medications.  Has not had sleep study but says she is not sleeping well. Not exercising but tries to park farther away.  SH:  Works full time at [employer] in [place]. Former smoker quit smoking 2 weeks ago.     Review of Systems: All pertinent positives and negatives as in HPI, otherwise negative.    Past Medical History  Diagnosis Date  . Hypertension     Current Outpatient Prescriptions  Medication Sig Dispense Refill  . amLODipine (NORVASC) 10 MG tablet Take 1 tablet (10 mg total) by mouth daily.  30 tablet  11  . ibuprofen (ADVIL,MOTRIN) 200 MG tablet Take 400 mg by mouth every 6 (six) hours as needed. For pain      . Lysine 1000 MG TABS Take 100 mg by mouth daily.      Marland Kitchen spironolactone (ALDACTONE) 25 MG tablet TAKE ONE TABLET BY MOUTH EVERY DAY  30 tablet  5  . [DISCONTINUED] Olmesartan-Amlodipine-HCTZ (TRIBENZOR) 40-5-25 MG TABS Take 1 tablet by mouth daily.       No current facility-administered medications for this encounter.    No Known Allergies  PHYSICAL EXAM: Filed Vitals:   10/29/13 1523  BP: 126/74  Pulse: 87  Weight: 229 lb 12.8 oz (104.237 kg)  SpO2: 98%   General:  Well appearing. No respiratory difficulty HEENT: normal Neck: supple. no JVD. Carotids 2+ bilat; no bruits. No lymphadenopathy or thryomegaly appreciated. Cor: PMI nondisplaced. Regular rate &  rhythm. No rubs, gallops or murmurs. Lungs: clear Abdomen: obese soft, nontender, nondistended. No hepatosplenomegaly. No bruits or masses. Good bowel sounds. Extremities: no cyanosis, clubbing, rash, tr-1+  bilateral lower extremity edema Neuro: alert & oriented x 3, cranial nerves grossly intact. moves all 4 extremities w/o difficulty. Affect pleasant.   ASSESSMENT & PLAN:  1. HTN- Controlled.  Continue amlodipine 10 mg daily and 25 mg Spironolactone daily . Encouraged to start exercising. Check BMET.  2. OSA- declines sleep study  3. Smoker- Congratulated on smoking cessation.  4. Lower extremity edema- add 20 mg lasix as needed and also 20 meq potassium if lasix is needed.   Provided script for ted hose.    Follow up in 1 year  CLEGG,AMY NP-C  3:33 PM  Patient seen and examined with Darrick Grinder, NP. We discussed all aspects of the encounter. I agree with the assessment and plan as stated above.   BP much improved. Will give prn lasix for severe edema. Discussed need for sleep study but she is not interested.  Daniel Bensimhon,MD 10:35 PM

## 2013-10-29 NOTE — Patient Instructions (Addendum)
Follow up in 1 year  Take lasix 20 mg as needed for lower extremity edema and 20 meq of potassium

## 2013-11-24 ENCOUNTER — Ambulatory Visit (HOSPITAL_COMMUNITY)
Admission: RE | Admit: 2013-11-24 | Discharge: 2013-11-24 | Disposition: A | Discharge status: Home / Self Care | Payer: No Typology Code available for payment source | Source: Ambulatory Visit | Attending: Pulmonary Disease | Admitting: Pulmonary Disease

## 2013-11-24 ENCOUNTER — Other Ambulatory Visit (HOSPITAL_COMMUNITY): Payer: Self-pay | Admitting: Pulmonary Disease

## 2013-11-24 DIAGNOSIS — M79605 Pain in left leg: Secondary | ICD-10-CM

## 2013-11-24 DIAGNOSIS — M79609 Pain in unspecified limb: Secondary | ICD-10-CM | POA: Insufficient documentation

## 2013-11-24 DIAGNOSIS — M79604 Pain in right leg: Secondary | ICD-10-CM

## 2013-12-02 ENCOUNTER — Other Ambulatory Visit (HOSPITAL_COMMUNITY): Payer: Self-pay | Admitting: Adult Health

## 2013-12-23 ENCOUNTER — Encounter: Payer: Self-pay | Admitting: Gastroenterology

## 2014-05-07 ENCOUNTER — Other Ambulatory Visit (HOSPITAL_COMMUNITY): Payer: Self-pay | Admitting: Adult Health

## 2014-06-29 ENCOUNTER — Other Ambulatory Visit (HOSPITAL_COMMUNITY): Payer: Self-pay | Admitting: Adult Health

## 2014-06-29 ENCOUNTER — Other Ambulatory Visit (HOSPITAL_COMMUNITY): Payer: Self-pay | Admitting: Internal Medicine

## 2014-06-30 ENCOUNTER — Other Ambulatory Visit (HOSPITAL_COMMUNITY): Payer: Self-pay | Admitting: Cardiology

## 2014-06-30 ENCOUNTER — Telehealth (HOSPITAL_COMMUNITY): Payer: Self-pay | Admitting: Vascular Surgery

## 2014-06-30 DIAGNOSIS — I509 Heart failure, unspecified: Secondary | ICD-10-CM

## 2014-06-30 MED ORDER — FUROSEMIDE 20 MG PO TABS: 20.0000 mg | ORAL_TABLET | ORAL | Status: DC | PRN

## 2014-06-30 MED ORDER — SPIRONOLACTONE 25 MG PO TABS: 25.0000 mg | ORAL_TABLET | Freq: Every day | ORAL | Status: DC

## 2014-06-30 NOTE — Telephone Encounter (Signed)
Refill

## 2014-07-13 ENCOUNTER — Encounter (HOSPITAL_COMMUNITY): Payer: Self-pay | Admitting: Emergency Medicine

## 2014-07-13 ENCOUNTER — Emergency Department (INDEPENDENT_AMBULATORY_CARE_PROVIDER_SITE_OTHER)
Admission: EM | Admit: 2014-07-13 | Discharge: 2014-07-13 | Disposition: A | Discharge status: Home / Self Care | Payer: PRIVATE HEALTH INSURANCE | Source: Home / Self Care | Attending: Emergency Medicine | Admitting: Emergency Medicine

## 2014-07-13 DIAGNOSIS — H16001 Unspecified corneal ulcer, right eye: Secondary | ICD-10-CM

## 2014-07-13 MED ORDER — MOXIFLOXACIN HCL 0.5 % OP SOLN: 1.0000 [drp] | Freq: Three times a day (TID) | OPHTHALMIC | Status: DC

## 2014-07-13 MED ORDER — SODIUM CHLORIDE 0.9 % IJ SOLN
INTRAMUSCULAR | Status: AC
  Filled 2014-07-13: qty 3

## 2014-07-13 MED ORDER — TETRACAINE HCL 0.5 % OP SOLN
OPHTHALMIC | Status: AC
  Filled 2014-07-13: qty 2

## 2014-07-13 MED ORDER — KETOROLAC TROMETHAMINE 0.5 % OP SOLN: 1.0000 [drp] | Freq: Four times a day (QID) | OPHTHALMIC | Status: DC

## 2014-07-13 NOTE — ED Notes (Signed)
Pt started with right eye problem last night.  Eye turned red and was hurting.  Woke up this morning " a little crusty".  And now the left eye is bothering her as well. Pt has her contact lenses in.

## 2014-07-13 NOTE — ED Provider Notes (Signed)
  Chief Complaint   Eye Problem   History of Present Illness   [name] is a 58 year old female who has a two-day history of right eye pain, redness, and tearing. She also has photophobia, sharp pain, burning, tearing, blurry vision, and photophobia. She wears contact lenses.  Review of Systems   Other than as noted above, the patient denies any of the following symptoms: Systemic:  No fever, chills, or headache. Eye:  No blurred vision, or diplopia. ENT:  No nasal congestion, rhinorrhea, or sore throat. Lymphatic:  No adenopathy. Skin:  No rash or pruritis.  [name]   Past medical history, family history, social history, meds, and allergies were reviewed.    Physical Examination    Vital signs:  BP 123/83  Pulse 69  Temp(Src) 98 F (36.7 C) (Oral)  Resp 16  Ht 5\' 6"  (1.676 m)  Wt 200 lb (90.719 kg)  BMI 32.30 kg/m2  SpO2 98%  General:  Alert and in no distress. Eye:  Eye lids were normal. Conjunctivas were minimally injected. There was no drainage. PERRLA, full EOMs, fundi are benign. She has contact lenses in place. She removed the right contact lens. Fluorescein stain was applied showing a tiny ulcer or abrasion at the 12:00 position near the limbus. Otherwise cornea was intact. ENT:  TMs and canals clear.  Nasal mucosa normal.  No intra-oral lesions, mucous membranes moist, pharynx clear. Neck:  No adenopathy tenderness or mass. Skin:  Clear, warm and dry.  Assessment   The encounter diagnosis was Corneal ulcer, right.  Likely secondary to contact lens wear.  Plan     1.  Meds:  The following meds were prescribed:   Discharge Medication List as of 07/13/2014 11:37 AM    START taking these medications   Details  ketorolac (ACULAR) 0.5 % ophthalmic solution Place 1 drop into the right eye every 6 (six) hours., Starting 07/13/2014, Until Discontinued, Normal    moxifloxacin (VIGAMOX) 0.5 % ophthalmic solution Place 1 drop into the right eye 3 (three)  times daily., Starting 07/13/2014, Until Discontinued, Normal        2.  Patient Education/Counseling:  The patient was given appropriate handouts, self care instructions, and instructed in symptomatic relief.  She should leave her contact lenses out, avoid bright lights or rubbing her eyes.   3.  Follow up:  The patient was told to follow up with her eye doctor if no better in 48 hours.       Harden Mo, MD 07/13/14 2159

## 2014-07-13 NOTE — Discharge Instructions (Signed)
Corneal Ulcer A corneal ulcer is an open sore on the cornea. The cornea is the clear covering at the front and center of the eye.  CAUSES  Most corneal ulcers are caused by infection, but there are other causes as well.  Bacterial infection. A bacterial infection can occur and cause a corneal ulcer if:  Contact lenses are worn too long (especially overnight) or are not properly cared for.  An eye injury occurs, allowing bacteria to infect the area of injury.  Viral infection. A viral infection can occur and cause a corneal ulcer if:  The eye becomes infected with a virus, such as the herpes simplex (cold sore) virus, chickenpox virus, or shingles virus.  Fungal infection. A fungal infection can occur and cause a corneal ulcer if:  An eye injury resulted from contact with a plant or plant material.  An anti-inflammatory eye drop is overused.  You have a weakened immune system.  Contact lenses are improperly cared for or become infected.  Foreign bodies in the eye, such as sand, glass, or small pieces of glass or metal.  Dry eyes.  Certain disorders that prevent eyelids from closing completely, such as Bell's palsy.  Contact lenses, especially extended-wear soft contact lenses. Contact lenses can:  Scratch the cornea's surface, allowing bacteria to enter the scratch.  Trap dirt underneath the contact lens, which can scratch the cornea.  Harbor bacteria and fungi, making it more likely for bacterial infections to occur.  Block oxygen from the cornea, making it more likely for infections to occur. SYMPTOMS   Eye pain that is often severe.  Blurry vision.  Light sensitivity.  Pus or thick discharge coming from your eye.  Eye redness.  Feeling like something is in your eye.  Watery or itchy eye.  Burning or stinging feeling. Some ulcers that are very big may be seen as a white spot on the cornea. DIAGNOSIS  An eye exam will be performed. Your health care provider  may use a special kind of microscope (slit lamp) to look at the cornea. Eye drops may be put into the eye to make the ulcer easier to see. If it is suspected that an infection caused the corneal ulcer, tissue samples or cultures from the eye may be taken. Numbing eye drops will be given before any samples or cultures are taken. The samples or cultures will be examined in the lab to check for bacteria, viruses, or fungi. TREATMENT  Treatment of the corneal ulcer depends on the cause. If your ulcer is severe, you may be given antibiotic eye drops up until your health care provider knows the test results. Other treatments can include:  Antibacterial, antiviral, or antifungal eye drops or ointment.  Removing the foreign body that caused the eye injury.  Artificial tears or a bandage contact lens if severe dry eyes caused the corneal ulcer.  Over-the-counter or prescription pain medicine.  Steroidal eye drops if the eye is inflamed and swollen.  Antibiotic medicines by mouth.  An injection of medicine under the thin membrane covering the eyeball (conjunctiva). This allows medicine to reach the ulcer in high doses.  Eye patching to reduce irritation from blinking and bright light. An eye patch may not be given if the ulcer was caused by a bacterial infection. If the corneal ulcer causes a scar on the cornea that interferes with vision, hospitalization and surgery may be needed to replace the cornea (corneal transplant). HOME CARE INSTRUCTIONS   If prescribed, use your antibiotic   pills, eye drops, or ointment as directed. Continue using them even if you start to feel better. You may have to apply eye drops as often as every few minutes to every hour, for days. It may be necessary to set your alarm clock every few minutes to every hour during the night. This is absolutely necessary.  Only take over-the-counter or prescription medicines as directed by your health care provider.  Apply artificial  tears as needed if you have dry eyes.  Do not touch or rub your eye, because this may increase the irritation and spread the infection.  Avoid wearing eye makeup.  Stay in a dark room and use sunglasses if you have light sensitivity.  Apply cool packs to your eye to relieve discomfort and swelling.  If your eye is patched, you should not drive or use machinery. You will have reduced side vision and ability to judge distance.  Do not drive or operate machinery until approved by your health care provider. Your ability to judge distances is impaired.  Follow up with your health care provider as directed.  Do not wear contact lenses until your health care provider approves. If you normally wear contact lenses, follow these general rules to avoid the risk of a corneal ulcer:  Do not wear contact lenses while you sleep.  Wash your hands before removing contact lenses.  Properly sterilize and store your contact lenses.  Regularly clean your contact lens case.  Do not use your saliva or tap water to clean or wet your contact lenses.  Remove your contact lenses if your eye becomes irritated. You may put them back in once your eyes feel better. SEEK IMMEDIATE MEDICAL CARE IF:   You notice a change in your vision.  Your pain is getting worse, not better.  You have increasing discharge from the eye. MAKE SURE YOU:   Understand these instructions.  Will watch your condition.  Will get help right away if you are not doing well or get worse. Document Released: 10/26/2004 Document Revised: 05/21/2013 Document Reviewed: 02/18/2013 Exit[employer] Patient Information 2015 [employer]. This information is not intended to replace advice given to you by your health care provider. Make sure you discuss any questions you have with your health care provider.  

## 2014-07-17 ENCOUNTER — Other Ambulatory Visit: Payer: Self-pay

## 2014-08-21 ENCOUNTER — Other Ambulatory Visit: Payer: Self-pay | Admitting: Pulmonary Disease

## 2014-08-21 DIAGNOSIS — Z9889 Other specified postprocedural states: Secondary | ICD-10-CM

## 2014-08-21 DIAGNOSIS — Z853 Personal history of malignant neoplasm of breast: Secondary | ICD-10-CM

## 2014-09-28 ENCOUNTER — Ambulatory Visit
Admission: RE | Admit: 2014-09-28 | Discharge: 2014-09-28 | Disposition: A | Discharge status: Home / Self Care | Payer: PRIVATE HEALTH INSURANCE | Source: Ambulatory Visit | Attending: Pulmonary Disease | Admitting: Pulmonary Disease

## 2014-09-28 DIAGNOSIS — Z9889 Other specified postprocedural states: Secondary | ICD-10-CM

## 2014-09-28 DIAGNOSIS — Z853 Personal history of malignant neoplasm of breast: Secondary | ICD-10-CM

## 2015-01-05 ENCOUNTER — Other Ambulatory Visit (HOSPITAL_COMMUNITY): Payer: Self-pay | Admitting: Internal Medicine

## 2015-01-05 DIAGNOSIS — I5022 Chronic systolic (congestive) heart failure: Secondary | ICD-10-CM

## 2015-02-03 ENCOUNTER — Other Ambulatory Visit (HOSPITAL_COMMUNITY): Payer: Self-pay | Admitting: Internal Medicine

## 2015-02-09 ENCOUNTER — Other Ambulatory Visit (INDEPENDENT_AMBULATORY_CARE_PROVIDER_SITE_OTHER): Payer: 59

## 2015-02-09 ENCOUNTER — Encounter: Payer: Self-pay | Admitting: Internal Medicine

## 2015-02-09 ENCOUNTER — Encounter: Payer: Self-pay | Admitting: Gastroenterology

## 2015-02-09 ENCOUNTER — Ambulatory Visit (INDEPENDENT_AMBULATORY_CARE_PROVIDER_SITE_OTHER): Payer: 59 | Admitting: Internal Medicine

## 2015-02-09 VITALS — BP 108/66 | HR 66 | Temp 98.0°F | Resp 14 | Ht 66.0 in | Wt 221.1 lb

## 2015-02-09 DIAGNOSIS — M25562 Pain in left knee: Secondary | ICD-10-CM

## 2015-02-09 DIAGNOSIS — I10 Essential (primary) hypertension: Secondary | ICD-10-CM

## 2015-02-09 DIAGNOSIS — H60393 Other infective otitis externa, bilateral: Secondary | ICD-10-CM

## 2015-02-09 DIAGNOSIS — Z0000 Encounter for general adult medical examination without abnormal findings: Secondary | ICD-10-CM

## 2015-02-09 DIAGNOSIS — M1990 Unspecified osteoarthritis, unspecified site: Secondary | ICD-10-CM

## 2015-02-09 DIAGNOSIS — I872 Venous insufficiency (chronic) (peripheral): Secondary | ICD-10-CM | POA: Diagnosis not present

## 2015-02-09 DIAGNOSIS — K5900 Constipation, unspecified: Secondary | ICD-10-CM | POA: Diagnosis not present

## 2015-02-09 DIAGNOSIS — E669 Obesity, unspecified: Secondary | ICD-10-CM

## 2015-02-09 LAB — COMPREHENSIVE METABOLIC PANEL
ALK PHOS: 82 U/L (ref 39–117)
ALT: 16 U/L (ref 0–35)
AST: 17 U/L (ref 0–37)
Albumin: 4.1 g/dL (ref 3.5–5.2)
BILIRUBIN TOTAL: 0.6 mg/dL (ref 0.2–1.2)
BUN: 11 mg/dL (ref 6–23)
CO2: 25 mEq/L (ref 19–32)
Calcium: 10.3 mg/dL (ref 8.4–10.5)
Chloride: 103 mEq/L (ref 96–112)
Creatinine, Ser: 0.98 mg/dL (ref 0.40–1.20)
GFR: 74.68 mL/min (ref >60.00)
Glucose, Bld: 168 mg/dL — ABNORMAL HIGH (ref 70–99)
Potassium: 4.6 mEq/L (ref 3.5–5.1)
SODIUM: 135 meq/L (ref 135–145)
TOTAL PROTEIN: 7.9 g/dL (ref 6.0–8.3)

## 2015-02-09 LAB — LIPID PANEL
Cholesterol: 149 mg/dL (ref 0–200)
HDL: 37.3 mg/dL — ABNORMAL LOW (ref >39.00)
LDL Cholesterol: 86 mg/dL (ref 0–99)
NONHDL: 111.7
TRIGLYCERIDES: 131 mg/dL (ref 0.0–149.0)
Total CHOL/HDL Ratio: 4
VLDL: 26.2 mg/dL (ref 0.0–40.0)

## 2015-02-09 LAB — CBC
HCT: 41 % (ref 36.0–46.0)
Hemoglobin: 13.3 g/dL (ref 12.0–15.0)
MCHC: 32.4 g/dL (ref 30.0–36.0)
MCV: 83.8 fl (ref 78.0–100.0)
PLATELETS: 436 10*3/uL — AB (ref 150.0–400.0)
RBC: 4.89 Mil/uL (ref 3.87–5.11)
RDW: 14.1 % (ref 11.5–15.5)
WBC: 7.4 10*3/uL (ref 4.0–10.5)

## 2015-02-09 LAB — HEMOGLOBIN A1C: Hgb A1c MFr Bld: 8.1 % — ABNORMAL HIGH (ref 4.6–6.5)

## 2015-02-09 MED ORDER — NEOMYCIN-POLYMYXIN-HC 3.5-10000-1 OT SOLN: 3.0000 [drp] | Freq: Three times a day (TID) | OTIC | Status: DC

## 2015-02-09 NOTE — Patient Instructions (Signed)
We have sent in some ear drops for the ears. Use 3 drops in each ear 3 times per day for the next 1-2 weeks. Avoid using anything in your ears but if you have to then q-tips may be a safer option that a paperclip.   For the legs we are going to get you in with the orthopedic doctor to see if the knees or the back is causing the problems.   We are checking blood work today and will see how the kidneys, liver, sugar, cholesterol is doing.   We will also send you back to the GI doctor. It may be worthwhile to try some fiber supplements over the counter. Be sure to drink about 6-8 glasses of water daily to help keep the bowels moving good.   Come back in about 3-6 months and we will check on how you are doing.   Exercise to Stay Healthy Exercise helps you become and stay healthy. EXERCISE IDEAS AND TIPS Choose exercises that:  You enjoy.  Fit into your day. You do not need to exercise really hard to be healthy. You can do exercises at a slow or medium level and stay healthy. You can:  Stretch before and after working out.  Try yoga, Pilates, or tai chi.  Lift weights.  Walk fast, swim, jog, run, climb stairs, bicycle, dance, or rollerskate.  Take aerobic classes. Exercises that burn about 150 calories:  Running 1  miles in 15 minutes.  Playing volleyball for 45 to 60 minutes.  Washing and waxing a car for 45 to 60 minutes.  Playing touch football for 45 minutes.  Walking 1  miles in 35 minutes.  Pushing a stroller 1  miles in 30 minutes.  Playing basketball for 30 minutes.  Raking leaves for 30 minutes.  Bicycling 5 miles in 30 minutes.  Walking 2 miles in 30 minutes.  Dancing for 30 minutes.  Shoveling snow for 15 minutes.  Swimming laps for 20 minutes.  Walking up stairs for 15 minutes.  Bicycling 4 miles in 15 minutes.  Gardening for 30 to 45 minutes.  Jumping rope for 15 minutes.  Washing windows or floors for 45 to 60 minutes. Document Released:  10/21/2010 Document Revised: 12/11/2011 Document Reviewed: 10/21/2010 [facility] Patient Information 2015 [place], [place]. This information is not intended to replace advice given to you by your health care provider. Make sure you discuss any questions you have with your health care provider.

## 2015-02-09 NOTE — Progress Notes (Signed)
Pre visit review using our clinic review tool, if applicable. No additional management support is needed unless otherwise documented below in the visit note. 

## 2015-02-10 ENCOUNTER — Encounter: Payer: Self-pay | Admitting: Internal Medicine

## 2015-02-10 DIAGNOSIS — E669 Obesity, unspecified: Secondary | ICD-10-CM | POA: Insufficient documentation

## 2015-02-10 DIAGNOSIS — M1990 Unspecified osteoarthritis, unspecified site: Secondary | ICD-10-CM | POA: Insufficient documentation

## 2015-02-10 DIAGNOSIS — H60399 Other infective otitis externa, unspecified ear: Secondary | ICD-10-CM | POA: Insufficient documentation

## 2015-02-10 DIAGNOSIS — I872 Venous insufficiency (chronic) (peripheral): Secondary | ICD-10-CM | POA: Insufficient documentation

## 2015-02-10 NOTE — Assessment & Plan Note (Signed)
Referral to orthopedics, talked to her about the fact that her weight is likely worsening her joint problems. She is concerned that she may need a replacement.

## 2015-02-10 NOTE — Assessment & Plan Note (Signed)
Given corticosporin drops and advised to not use paperclips in her ears ever again. If no improvement she will call us back.

## 2015-02-10 NOTE — Assessment & Plan Note (Signed)
BP controlled on her spironolactone, amlodipine, lasix. Unclear to me why she needs lasix and if BP elevates would replace it with hctz.

## 2015-02-10 NOTE — Progress Notes (Signed)
   Subjective:    Patient ID: [name], female    DOB: 1956-01-24, 59 y.o.   MRN: [id number]  HPI The patient is a 59 YO female who is new today and coming in with several complaints. Her ears have been hurting for months now and itching. She has been scratching them with paperclips since she knows that q-tips are not good for your ears. Denies drainage or significant nasal congestion or allergies.  Her next complaint is leg cramps and especially after sitting they seem to feel slightly numb and painful as they wake up. Mostly when she is sitting for a long time and not predictable. It is mild and she has not tried anything for it. Please see A/P for status and treatment of chronic medical problems.   PMH, [facility], social history reviewed and updated with the patient.   Review of Systems  Constitutional: Negative for fever, activity change, appetite change, fatigue and unexpected weight change.  HENT: Positive for ear pain. Negative for congestion, ear discharge, postnasal drip, rhinorrhea, sinus pressure, sore throat and trouble swallowing.   Eyes: Negative.   Respiratory: Negative for cough, chest tightness, shortness of breath and wheezing.   Cardiovascular: Negative for chest pain, palpitations and leg swelling.  Gastrointestinal: Negative for nausea, abdominal pain, diarrhea, constipation and abdominal distention.  Musculoskeletal: Negative.   Skin: Negative.   Neurological: Positive for numbness. Negative for dizziness, speech difficulty, weakness and light-headedness.  Psychiatric/Behavioral: Negative for behavioral problems, sleep disturbance and decreased concentration. The patient is not nervous/anxious.       Objective:   Physical Exam  Constitutional: She is oriented to person, place, and time. She appears well-developed and well-nourished.  HENT:  Head: Normocephalic and atraumatic.  Nose: Nose normal.  Mouth/Throat: Oropharynx is clear and moist.  Right ear with  redness in the canal and TM full of fluid, not infected appearing, left canal less red but TM similar in appearance  Eyes: EOM are normal.  Neck: Normal range of motion.  Cardiovascular: Normal rate and regular rhythm.   Pulmonary/Chest: Effort normal and breath sounds normal. No respiratory distress. She has no wheezes.  Abdominal: Soft. Bowel sounds are normal. She exhibits no distension. There is no tenderness.  Musculoskeletal: She exhibits no edema.  Neurological: She is alert and oriented to person, place, and time. Coordination normal.  Skin: Skin is warm and dry.  Psychiatric: She has a normal mood and affect.   Filed Vitals:   02/09/15 0837  BP: 108/66  Pulse: 66  Temp: 98 F (36.7 C)  TempSrc: Oral  Resp: 14  Height: 5\' 6"  (1.676 m)  Weight: 221 lb 1.9 oz (100.299 kg)  SpO2: 97%      Assessment & Plan:

## 2015-02-10 NOTE — Assessment & Plan Note (Signed)
Checking for complications of diabetes. She has had very elevated sugars in the past and likely will be a new diabetic. Checking lipid panel today.

## 2015-02-10 NOTE — Assessment & Plan Note (Signed)
Compression stockings rx given today and advised that this is a better way to help with her fluid than the lasix.

## 2015-02-11 ENCOUNTER — Encounter: Payer: Self-pay | Admitting: Gastroenterology

## 2015-02-12 ENCOUNTER — Other Ambulatory Visit: Payer: Self-pay | Admitting: Internal Medicine

## 2015-02-12 MED ORDER — METFORMIN HCL 500 MG PO TABS: 500.0000 mg | ORAL_TABLET | Freq: Two times a day (BID) | ORAL | Status: DC

## 2015-02-15 NOTE — Progress Notes (Signed)
Patient aware of results and has picked up her medication.

## 2015-03-14 ENCOUNTER — Other Ambulatory Visit: Payer: Self-pay | Admitting: Internal Medicine

## 2015-04-16 ENCOUNTER — Ambulatory Visit (INDEPENDENT_AMBULATORY_CARE_PROVIDER_SITE_OTHER): Payer: 59 | Admitting: Gastroenterology

## 2015-04-16 ENCOUNTER — Encounter: Payer: Self-pay | Admitting: Gastroenterology

## 2015-04-16 VITALS — BP 116/80 | HR 60 | Ht 64.25 in | Wt 215.2 lb

## 2015-04-16 DIAGNOSIS — K219 Gastro-esophageal reflux disease without esophagitis: Secondary | ICD-10-CM

## 2015-04-16 DIAGNOSIS — K5900 Constipation, unspecified: Secondary | ICD-10-CM | POA: Insufficient documentation

## 2015-04-16 DIAGNOSIS — R1013 Epigastric pain: Secondary | ICD-10-CM | POA: Diagnosis not present

## 2015-04-16 DIAGNOSIS — R109 Unspecified abdominal pain: Secondary | ICD-10-CM | POA: Diagnosis not present

## 2015-04-16 MED ORDER — PANTOPRAZOLE SODIUM 40 MG PO TBEC: 40.0000 mg | DELAYED_RELEASE_TABLET | Freq: Every day | ORAL | Status: DC

## 2015-04-16 MED ORDER — NA SULFATE-K SULFATE-MG SULF 17.5-3.13-1.6 GM/177ML PO SOLN: 1.0000 | Freq: Once | ORAL | Status: DC

## 2015-04-16 NOTE — Assessment & Plan Note (Signed)
Probable idiopathic constipation.  Recommendations #1 fiber supplementation #2 colonoscopy (greater than 10 years since last exam)

## 2015-04-16 NOTE — Assessment & Plan Note (Signed)
Patient has symptomatic GERD.  Symptoms preceded and said use although NSAIDs may exacerbate his symptoms.  Recommendations #1 begin Protonix 40 mg daily

## 2015-04-16 NOTE — Assessment & Plan Note (Signed)
Symptoms may be related to GERD.  Gastroparesis should also be ruled out.  Recommendations #1 Protonix 40 mg daily #2 gastric emptying scan  CC Dr. Doug Sou

## 2015-04-16 NOTE — Patient Instructions (Signed)
You have been scheduled for a gastric emptying scan at [facility] on 05/05/2015 at 7:30am . Please arrive at least 15 minutes prior to your appointment for registration. Please make certain not to have anything to eat or drink after midnight the night before your test. Hold all stomach medications (ex: Zofran, phenergan, Reglan) 48 hours prior to your test. If you need to reschedule your appointment, please contact radiology scheduling at [phone]. _____________________________________________________________________ A gastric-emptying study measures how long it takes for food to move through your stomach. There are several ways to measure stomach emptying. In the most common test, you eat food that contains a small amount of radioactive material. A scanner that detects the movement of the radioactive material is placed over your abdomen to monitor the rate at which food leaves your stomach. This test normally takes about 2 hours to complete. _____________________________________________________________________   [name] have been scheduled for a colonoscopy. Please follow written instructions given to you at your visit today.  Please pick up your prep supplies at the pharmacy within the next 1-3 days. If you use inhalers (even only as needed), please bring them with you on the day of your procedure. Your physician has requested that you go to www.startemmi.com and enter the access code given to you at your visit today. This web site gives a general overview about your procedure. However, you should still follow specific instructions given to you by our office regarding your preparation for the procedure.

## 2015-04-16 NOTE — Progress Notes (Signed)
_                                                                                                                History of Present Illness:  [name] is a pleasant 59 year old Afro-American female referred at the request of Dr. Doug Sou for dyspepsia and reflux.  She has  very frequent pyrosis.  Symptoms occur throughout the day.  She complains of postprandial bloating and constipation.  She may have a bowel movement every 3-4 days.  Symptoms preceded her use of diclofenac which she is taking for knee pain.  She has mild nausea.  She denies dysphagia.  Last colonoscopy in 2004 was normal.   Past Medical History  Diagnosis Date  . Hypertension   . Breast cancer 2011  . Osteoarthritis of both knees   . Diabetes mellitus, type II    Past Surgical History  Procedure Laterality Date  . Mastectomy, partial    . Abdominal hysterectomy    . Bladder suspension    . Oophorectomy    . Meniscus repair     family history is not on file. She was adopted. Current Outpatient Prescriptions  Medication Sig Dispense Refill  . acetaminophen (TYLENOL) 650 MG CR tablet Take 650 mg by mouth every 8 (eight) hours as needed for pain.    Marland Kitchen amLODipine (NORVASC) 10 MG tablet TAKE 1 TABLET (10 MG TOTAL) BY MOUTH DAILY. 30 tablet 10  . B Complex-Folic Acid (B COMPLEX-VITAMIN B12 PO) Take 5,000 mg by mouth. Take one 3000 mcg soft gel by mouth daily    . diclofenac (VOLTAREN) 75 MG EC tablet Take 1 tablet by mouth daily.    . furosemide (LASIX) 20 MG tablet TAKE 1 TABLET (20 MG TOTAL) BY MOUTH AS NEEDED. TAKE WITH 20 MEQ OF POTASSIUM 30 tablet 6  . KLOR-CON M20 20 MEQ tablet TAKE 1 TABLET BY MOUTH EVERY DAY AS NEEDED 30 tablet 6  . metFORMIN (GLUCOPHAGE) 500 MG tablet Take 1 tablet (500 mg total) by mouth 2 (two) times daily with a meal. 180 tablet 3  . neomycin-polymyxin-hydrocortisone (CORTISPORIN) otic solution Place 3 drops into both ears 3 (three) times daily. 20 mL 0  . spironolactone  (ALDACTONE) 25 MG tablet TAKE 1 TABLET (25 MG TOTAL) BY MOUTH DAILY. 30 tablet 11  . [DISCONTINUED] Olmesartan-Amlodipine-HCTZ (TRIBENZOR) 40-5-25 MG TABS Take 1 tablet by mouth daily.    . [DISCONTINUED] potassium chloride 20 MEQ TBCR Take 20 mEq by mouth as needed. 30 tablet 6   No current facility-administered medications for this visit.   Allergies as of 04/16/2015  . (No Known Allergies)    reports that she quit smoking about 18 months ago. Her smoking use included Cigarettes. She has a 10 pack-year smoking history. She has never used smokeless tobacco. She reports that she does not drink alcohol or use illicit drugs.   Review of Systems: Pertinent positive and negative review of systems were noted in the above HPI section. All other review of systems were  otherwise negative.  Vital signs were reviewed in today's medical record Physical Exam: General: Well developed , well nourished, no acute distress Skin: anicteric Head: Normocephalic and atraumatic Eyes:  sclerae anicteric, EOMI Ears: Normal auditory acuity Mouth: No deformity or lesions Neck: Supple, no masses or thyromegaly Lymph Nodes: no lymphadenopathy Lungs: Clear throughout to auscultation Heart: Regular rate and rhythm; no murmurs, rubs or bruits Gastroinestinal: Soft, non tender and non distended. No masses, hepatosplenomegaly or hernias noted. Normal Bowel sounds.  There is no succussion splash Rectal:deferred Musculoskeletal: Symmetrical with no gross deformities  Skin: No lesions on visible extremities Pulses:  Normal pulses noted Extremities: No clubbing, cyanosis, edema or deformities noted Neurological: Alert oriented x 4, grossly nonfocal Cervical Nodes:  No significant cervical adenopathy Inguinal Nodes: No significant inguinal adenopathy Psychological:  Alert and cooperative. Normal mood and affect  See Assessment and Plan under Problem List

## 2015-04-20 NOTE — Addendum Note (Signed)
Addended by: Oda Kilts on: 04/20/2015 09:45 AM   Modules accepted: Orders

## 2015-05-05 ENCOUNTER — Ambulatory Visit (HOSPITAL_COMMUNITY)
Admission: RE | Admit: 2015-05-05 | Discharge: 2015-05-05 | Disposition: A | Discharge status: Home / Self Care | Payer: 59 | Source: Ambulatory Visit | Attending: Gastroenterology | Admitting: Gastroenterology

## 2015-05-05 DIAGNOSIS — R109 Unspecified abdominal pain: Secondary | ICD-10-CM | POA: Diagnosis not present

## 2015-05-05 DIAGNOSIS — R140 Abdominal distension (gaseous): Secondary | ICD-10-CM | POA: Insufficient documentation

## 2015-05-05 MED ORDER — TECHNETIUM TC 99M SULFUR COLLOID
2.1000 | Freq: Once | INTRAVENOUS | Status: AC | PRN
Start: 2015-05-05 — End: 2015-05-05
  Administered 2015-05-05: 2.1 via ORAL

## 2015-05-06 NOTE — Progress Notes (Signed)
Quick Note:  Please inform the patient that GES was normal and to continue current plan of action ______ 

## 2015-05-07 ENCOUNTER — Ambulatory Visit (AMBULATORY_SURGERY_CENTER): Payer: 59 | Admitting: Gastroenterology

## 2015-05-07 ENCOUNTER — Encounter: Payer: Self-pay | Admitting: Gastroenterology

## 2015-05-07 VITALS — BP 153/94 | HR 45 | Temp 98.3°F | Resp 18 | Ht 64.0 in | Wt 215.0 lb

## 2015-05-07 DIAGNOSIS — Z1211 Encounter for screening for malignant neoplasm of colon: Secondary | ICD-10-CM

## 2015-05-07 DIAGNOSIS — K648 Other hemorrhoids: Secondary | ICD-10-CM

## 2015-05-07 LAB — GLUCOSE, CAPILLARY
Glucose-Capillary: 109 mg/dL — ABNORMAL HIGH (ref 65–99)
Glucose-Capillary: 99 mg/dL (ref 65–99)

## 2015-05-07 MED ORDER — SODIUM CHLORIDE 0.9 % IV SOLN: 500.0000 mL | INTRAVENOUS | Status: DC

## 2015-05-07 NOTE — Op Note (Addendum)
[place]  [address] [place], [id number]   COLONOSCOPY PROCEDURE REPORT  PATIENT: [name], [name]  MR#: [id number] BIRTHDATE: 01-29-1956 , 26  yrs. old GENDER: female ENDOSCOPIST: Inda Castle, MD REFERRED TE:LMRAJHHID Doug Sou, M.D. PROCEDURE DATE:  05/07/2015 PROCEDURE:   Colonoscopy, screening First Screening Colonoscopy - Avg.  risk and is 50 yrs.  old or older - No.  Prior Negative Screening - Now for repeat screening. 10 or more years since last screening  History of Adenoma - Now for follow-up colonoscopy & has been > or = to 3 yrs.  N/A  Polyps removed today? No Recommend repeat exam, <10 yrs? No ASA CLASS:   Class II INDICATIONS:Colorectal Neoplasm Risk Assessment for this procedure is average risk. MEDICATIONS: Monitored anesthesia care and Propofol 200 mg IV  DESCRIPTION OF PROCEDURE:   After the risks benefits and alternatives of the procedure were thoroughly explained, informed consent was obtained.  The digital rectal exam revealed no abnormalities of the rectum.   The [id number]  endoscope was introduced through the anus and advanced to the cecum, which was identified by both the appendix and ileocecal valve. No adverse events experienced.   The quality of the prep was (Suprep was used) excellent.  The instrument was then slowly withdrawn as the colon was fully examined. Estimated blood loss is zero unless otherwise noted in this procedure report.      COLON FINDINGS: A normal appearing cecum, ileocecal valve, and appendiceal orifice were identified.  The ascending, transverse, descending, sigmoid colon, and rectum appeared unremarkable. Retroflexed views revealed Internal hemorrhoids. The time to cecum = 3.9 Withdrawal time = 7.7   The scope was withdrawn and the procedure completed. COMPLICATIONS: There were no immediate complications.  ENDOSCOPIC IMPRESSION: Normal colonoscopy internal  hemorrhoids  RECOMMENDATIONS: Continue current colorectal screening recommendations for "routine risk" patients with a repeat colonoscopy in 10 years.  eSigned:  Inda Castle, MD 05/07/2015 11:23 AM Revised: 05/07/2015 11:23 AM  cc:

## 2015-05-07 NOTE — Progress Notes (Signed)
To recovery, report to Scott, RN, VSS 

## 2015-05-07 NOTE — Patient Instructions (Signed)
YOU HAD AN ENDOSCOPIC PROCEDURE TODAY AT [facility]:   Refer to the procedure report that was given to you for any specific questions about what was found during the examination.  If the procedure report does not answer your questions, please call your gastroenterologist to clarify.  If you requested that your care partner not be given the details of your procedure findings, then the procedure report has been included in a sealed envelope for you to review at your convenience later.  YOU SHOULD EXPECT: Some feelings of bloating in the abdomen. Passage of more gas than usual.  Walking can help get rid of the air that was put into your GI tract during the procedure and reduce the bloating. If you had a lower endoscopy (such as a colonoscopy or flexible sigmoidoscopy) you may notice spotting of blood in your stool or on the toilet paper. If you underwent a bowel prep for your procedure, you may not have a normal bowel movement for a few days.  Please Note:  You might notice some irritation and congestion in your nose or some drainage.  This is from the oxygen used during your procedure.  There is no need for concern and it should clear up in a day or so.  SYMPTOMS TO REPORT IMMEDIATELY:   Following lower endoscopy (colonoscopy or flexible sigmoidoscopy):  Excessive amounts of blood in the stool  Significant tenderness or worsening of abdominal pains  Swelling of the abdomen that is new, acute  Fever of 100F or higher   For urgent or emergent issues, a gastroenterologist can be reached at any hour by calling [phone].   DIET: Your first meal following the procedure should be a small meal and then it is ok to progress to your normal diet. Heavy or fried foods are harder to digest and may make you feel nauseous or bloated.  Likewise, meals heavy in dairy and vegetables can increase bloating.  Drink plenty of fluids but you should avoid alcoholic beverages for 24  hours.  ACTIVITY:  You should plan to take it easy for the rest of today and you should NOT DRIVE or use heavy machinery until tomorrow (because of the sedation medicines used during the test).    FOLLOW UP: Our staff will call the number listed on your records the next business day following your procedure to check on you and address any questions or concerns that you may have regarding the information given to you following your procedure. If we do not reach you, we will leave a message.  However, if you are feeling well and you are not experiencing any problems, there is no need to return our call.  We will assume that you have returned to your regular daily activities without incident.  If any biopsies were taken you will be contacted by phone or by letter within the next 1-3 weeks.  Please call us at [phone] if you have not heard about the biopsies in 3 weeks.    SIGNATURES/CONFIDENTIALITY: You and/or your care partner have signed paperwork which will be entered into your electronic medical record.  These signatures attest to the fact that that the information above on your After Visit Summary has been reviewed and is understood.  Full responsibility of the confidentiality of this discharge information lies with you and/or your care-partner.  Normal colonoscopy-recall 10 years-2026

## 2015-05-10 ENCOUNTER — Telehealth: Payer: Self-pay | Admitting: *Deleted

## 2015-05-10 NOTE — Telephone Encounter (Signed)
Message left

## 2015-06-14 ENCOUNTER — Other Ambulatory Visit (INDEPENDENT_AMBULATORY_CARE_PROVIDER_SITE_OTHER): Payer: 59

## 2015-06-14 ENCOUNTER — Encounter: Payer: Self-pay | Admitting: Internal Medicine

## 2015-06-14 ENCOUNTER — Ambulatory Visit (INDEPENDENT_AMBULATORY_CARE_PROVIDER_SITE_OTHER): Payer: 59 | Admitting: Internal Medicine

## 2015-06-14 VITALS — BP 130/70 | HR 70 | Temp 98.6°F | Resp 16 | Ht 66.0 in | Wt 216.0 lb

## 2015-06-14 DIAGNOSIS — I10 Essential (primary) hypertension: Secondary | ICD-10-CM

## 2015-06-14 DIAGNOSIS — E119 Type 2 diabetes mellitus without complications: Secondary | ICD-10-CM

## 2015-06-14 DIAGNOSIS — E118 Type 2 diabetes mellitus with unspecified complications: Secondary | ICD-10-CM | POA: Insufficient documentation

## 2015-06-14 LAB — COMPREHENSIVE METABOLIC PANEL
ALT: 22 U/L (ref 0–35)
AST: 16 U/L (ref 0–37)
Albumin: 4.3 g/dL (ref 3.5–5.2)
Alkaline Phosphatase: 81 U/L (ref 39–117)
BUN: 20 mg/dL (ref 6–23)
CO2: 28 meq/L (ref 19–32)
CREATININE: 1.05 mg/dL (ref 0.40–1.20)
Calcium: 10.4 mg/dL (ref 8.4–10.5)
Chloride: 101 mEq/L (ref 96–112)
GFR: 68.88 mL/min (ref >60.00)
Glucose, Bld: 121 mg/dL — ABNORMAL HIGH (ref 70–99)
Potassium: 4.5 mEq/L (ref 3.5–5.1)
Sodium: 137 mEq/L (ref 135–145)
Total Bilirubin: 0.5 mg/dL (ref 0.2–1.2)
Total Protein: 8 g/dL (ref 6.0–8.3)

## 2015-06-14 LAB — HEMOGLOBIN A1C: HEMOGLOBIN A1C: 6.8 % — AB (ref 4.6–6.5)

## 2015-06-14 NOTE — Progress Notes (Signed)
   Subjective:    Patient ID: [name], female    DOB: 03/24/1956, 59 y.o.   MRN: [id number]  HPI The patient is a 59 YO female who is coming in for follow up of her newly diagnosed diabetes. Since last visit she has started taking metformin. Denies any side effects but is starting to feel better. Her stomach is doing better overall although she does still have some troubles with it. No numbness or burning in her feet. Has already seen her eye doctor this year. Has been getting injections in her knee and now is able to walk again and will start exercising soon. Has taken care of her mother with diabetes and feels like she knows about what she should be eating.   Review of Systems  Constitutional: Negative for fever, activity change, appetite change, fatigue and unexpected weight change.  HENT: Negative for congestion, ear discharge, ear pain, postnasal drip, rhinorrhea, sinus pressure, sore throat and trouble swallowing.   Respiratory: Negative for cough, chest tightness, shortness of breath and wheezing.   Cardiovascular: Negative for chest pain, palpitations and leg swelling.  Gastrointestinal: Negative for nausea, abdominal pain, diarrhea, constipation and abdominal distention.  Musculoskeletal: Positive for arthralgias. Negative for back pain.       Much improved  Skin: Negative.   Neurological: Negative for dizziness, speech difficulty, weakness, light-headedness and numbness.  Psychiatric/Behavioral: Negative for behavioral problems, sleep disturbance and decreased concentration. The patient is not nervous/anxious.       Objective:   Physical Exam  Constitutional: She is oriented to person, place, and time. She appears well-developed and well-nourished.  HENT:  Head: Normocephalic and atraumatic.  Mouth/Throat: Oropharynx is clear and moist.  Eyes: EOM are normal.  Neck: Normal range of motion.  Cardiovascular: Normal rate and regular rhythm.   Pulmonary/Chest: Effort  normal and breath sounds normal. No respiratory distress. She has no wheezes.  Abdominal: Soft. Bowel sounds are normal. She exhibits no distension. There is no tenderness.  Musculoskeletal: She exhibits no edema.  Neurological: She is alert and oriented to person, place, and time. Coordination normal.  Skin: Skin is warm and dry.  See foot exam  Psychiatric: She has a normal mood and affect.   Filed Vitals:   06/14/15 0849  BP: 130/70  Pulse: 70  Temp: 98.6 F (37 C)  TempSrc: Oral  Resp: 16  Height: 5\' 6"  (1.676 m)  Weight: 216 lb (97.977 kg)  SpO2: 96%      Assessment & Plan:

## 2015-06-14 NOTE — Assessment & Plan Note (Signed)
Foot exam done today, talked to her about the importance of yearly eye exam (she has been already this year and gets yearly). Doing well on metformin 500 mg BID and will check HgA1c and CMP today and adjust as needed. Given the counseling on the new diagnosis no time to discuss ACE-I. Needs urine microalbumin at next visit. Will discuss pneumonia shot at next visit. Offered medical nutrion counseling and she declined today and will think about it.

## 2015-06-14 NOTE — Patient Instructions (Signed)
We will check the blood work today and call you back about the results.  Think about doing the nutrition session for information about your diabetes and how this changes what you can eat and how much of things you should be eating. Your insurance will cover it.   Come back in about 4-5 months so we can check on the sugars, if you have problems before then please feel free to call the office.  Diabetes and Standards of Medical Care Diabetes is complicated. You may find that your diabetes team includes a dietitian, nurse, diabetes educator, eye doctor, and more. To help everyone know what is going on and to help you get the care you deserve, the following schedule of care was developed to help keep you on track. Below are the tests, exams, vaccines, medicines, education, and plans you will need. HbA1c test This test shows how well you have controlled your glucose over the past 2-3 months. It is used to see if your diabetes management plan needs to be adjusted.   It is performed at least 2 times a year if you are meeting treatment goals.  It is performed 4 times a year if therapy has changed or if you are not meeting treatment goals. Blood pressure test  This test is performed at every routine medical visit. The goal is less than 140/90 mm Hg for most people, but 130/80 mm Hg in some cases. Ask your health care provider about your goal. Dental exam  Follow up with the dentist regularly. Eye exam  If you are diagnosed with type 1 diabetes as a child, get an exam upon reaching the age of 28 years or older and have had diabetes for 3-5 years. Yearly eye exams are recommended after that initial eye exam.  If you are diagnosed with type 1 diabetes as an adult, get an exam within 5 years of diagnosis and then yearly.  If you are diagnosed with type 2 diabetes, get an exam as soon as possible after the diagnosis and then yearly. Foot care exam  Visual foot exams are performed at every routine  medical visit. The exams check for cuts, injuries, or other problems with the feet.  A comprehensive foot exam should be done yearly. This includes visual inspection as well as assessing foot pulses and testing for loss of sensation.  Check your feet nightly for cuts, injuries, or other problems with your feet. Tell your health care provider if anything is not healing. Kidney function test (urine microalbumin)  This test is performed once a year.  Type 1 diabetes: The first test is performed 5 years after diagnosis.  Type 2 diabetes: The first test is performed at the time of diagnosis.  A serum creatinine and estimated glomerular filtration rate (eGFR) test is done once a year to assess the level of chronic kidney disease (CKD), if present. Lipid profile (cholesterol, HDL, LDL, triglycerides)  Performed every 5 years for most people.  The goal for LDL is less than 100 mg/dL. If you are at high risk, the goal is less than 70 mg/dL.  The goal for HDL is 40 mg/dL-50 mg/dL for men and 50 mg/dL-60 mg/dL for women. An HDL cholesterol of 60 mg/dL or higher gives some protection against heart disease.  The goal for triglycerides is less than 150 mg/dL. Influenza vaccine, pneumococcal vaccine, and hepatitis B vaccine  The influenza vaccine is recommended yearly.  It is recommended that people with diabetes who are over 40 years old  get the pneumonia vaccine. In some cases, two separate shots may be given. Ask your health care provider if your pneumonia vaccination is up to date.  The hepatitis B vaccine is also recommended for adults with diabetes. Diabetes self-management education  Education is recommended at diagnosis and ongoing as needed. Treatment plan  Your treatment plan is reviewed at every medical visit. Document Released: 07/16/2009 Document Revised: 02/02/2014 Document Reviewed: 02/18/2013 [facility] Patient Information 2015 [place], [place]. This information is not intended to  replace advice given to you by your health care provider. Make sure you discuss any questions you have with your health care provider.  Diabetes and Exercise Exercising regularly is important. It is not just about losing weight. It has many health benefits, such as:  Improving your overall fitness, flexibility, and endurance.  Increasing your bone density.  Helping with weight control.  Decreasing your body fat.  Increasing your muscle strength.  Reducing stress and tension.  Improving your overall health. People with diabetes who exercise gain additional benefits because exercise:  Reduces appetite.  Improves the body's use of blood sugar (glucose).  Helps lower or control blood glucose.  Decreases blood pressure.  Helps control blood lipids (such as cholesterol and triglycerides).  Improves the body's use of the hormone insulin by:  Increasing the body's insulin sensitivity.  Reducing the body's insulin needs.  Decreases the risk for heart disease because exercising:  Lowers cholesterol and triglycerides levels.  Increases the levels of good cholesterol (such as high-density lipoproteins [HDL]) in the body.  Lowers blood glucose levels. YOUR ACTIVITY PLAN  Choose an activity that you enjoy and set realistic goals. Your health care provider or diabetes educator can help you make an activity plan that works for you. Exercise regularly as directed by your health care provider. This includes:  Performing resistance training twice a week such as push-ups, sit-ups, lifting weights, or using resistance bands.  Performing 150 minutes of cardio exercises each week such as walking, running, or playing sports.  Staying active and spending no more than 90 minutes at one time being inactive. Even short bursts of exercise are good for you. Three 10-minute sessions spread throughout the day are just as beneficial as a single 30-minute session. Some exercise ideas  include:  Taking the dog for a walk.  Taking the stairs instead of the elevator.  Dancing to your favorite song.  Doing an exercise video.  Doing your favorite exercise with a friend. RECOMMENDATIONS FOR EXERCISING WITH TYPE 1 OR TYPE 2 DIABETES   Check your blood glucose before exercising. If blood glucose levels are greater than 240 mg/dL, check for urine ketones. Do not exercise if ketones are present.  Avoid injecting insulin into areas of the body that are going to be exercised. For example, avoid injecting insulin into:  The arms when playing tennis.  The legs when jogging.  Keep a record of:  Food intake before and after you exercise.  Expected peak times of insulin action.  Blood glucose levels before and after you exercise.  The type and amount of exercise you have done.  Review your records with your health care provider. Your health care provider will help you to develop guidelines for adjusting food intake and insulin amounts before and after exercising.  If you take insulin or oral hypoglycemic agents, watch for signs and symptoms of hypoglycemia. They include:  Dizziness.  Shaking.  Sweating.  Chills.  Confusion.  Drink plenty of water while you exercise to prevent  dehydration or heat stroke. Body water is lost during exercise and must be replaced.  Talk to your health care provider before starting an exercise program to make sure it is safe for you. Remember, almost any type of activity is better than none. Document Released: 12/09/2003 Document Revised: 02/02/2014 Document Reviewed: 02/25/2013 [facility] Patient Information 2015 [place], [place]. This information is not intended to replace advice given to you by your health care provider. Make sure you discuss any questions you have with your health care provider.

## 2015-06-14 NOTE — Assessment & Plan Note (Signed)
Given her new diagnosis of diabetes can consider changing the amlodipine to ACE-I at next visit. Today did not have time to discuss and BP at goal on spironolactone, lasix, amlodipine.

## 2015-06-14 NOTE — Progress Notes (Signed)
Pre visit review using our clinic review tool, if applicable. No additional management support is needed unless otherwise documented below in the visit note. 

## 2015-07-02 ENCOUNTER — Other Ambulatory Visit (HOSPITAL_COMMUNITY): Payer: Self-pay | Admitting: Adult Health

## 2015-08-30 ENCOUNTER — Other Ambulatory Visit: Payer: Self-pay | Admitting: Internal Medicine

## 2015-08-30 ENCOUNTER — Other Ambulatory Visit: Payer: Self-pay

## 2015-08-30 DIAGNOSIS — Z853 Personal history of malignant neoplasm of breast: Secondary | ICD-10-CM

## 2015-09-07 ENCOUNTER — Telehealth: Payer: Self-pay | Admitting: Internal Medicine

## 2015-09-07 MED ORDER — SCOPOLAMINE 1 MG/3DAYS TD PT72
1.0000 | MEDICATED_PATCH | TRANSDERMAL | Status: DC
Start: 2015-09-07 — End: 2016-04-28

## 2015-09-07 NOTE — Telephone Encounter (Signed)
Pt is going on a cruise in a couple weeks and she is requesting a prescription for sea sickness Pharmacy is [facility] in [place] Please advise

## 2015-09-07 NOTE — Telephone Encounter (Signed)
Rx for scopolamine patch which she places morning of cruise and is good for 3 days. Generally this is fine for a 5 day or similar length cruise. Most common side effect dry mouth.

## 2015-09-07 NOTE — Telephone Encounter (Signed)
Called pt no answer LMOM rx sent to [facility]../[facility] 

## 2015-10-01 ENCOUNTER — Other Ambulatory Visit: Payer: Self-pay | Admitting: Internal Medicine

## 2015-10-01 ENCOUNTER — Ambulatory Visit
Admission: RE | Admit: 2015-10-01 | Discharge: 2015-10-01 | Disposition: A | Discharge status: Home / Self Care | Payer: 59 | Source: Ambulatory Visit | Attending: Internal Medicine | Admitting: Internal Medicine

## 2015-10-01 DIAGNOSIS — Z853 Personal history of malignant neoplasm of breast: Secondary | ICD-10-CM

## 2015-10-23 ENCOUNTER — Other Ambulatory Visit (HOSPITAL_COMMUNITY): Payer: Self-pay | Admitting: Internal Medicine

## 2015-11-25 ENCOUNTER — Encounter: Payer: Self-pay | Admitting: Internal Medicine

## 2015-11-25 ENCOUNTER — Ambulatory Visit (INDEPENDENT_AMBULATORY_CARE_PROVIDER_SITE_OTHER): Payer: Managed Care, Other (non HMO) | Admitting: Internal Medicine

## 2015-11-25 VITALS — BP 140/90 | HR 65 | Temp 98.2°F | Resp 18 | Ht 66.0 in | Wt 220.0 lb

## 2015-11-25 DIAGNOSIS — J0110 Acute frontal sinusitis, unspecified: Secondary | ICD-10-CM

## 2015-11-25 DIAGNOSIS — J329 Chronic sinusitis, unspecified: Secondary | ICD-10-CM | POA: Insufficient documentation

## 2015-11-25 MED ORDER — FLUTICASONE PROPIONATE 50 MCG/ACT NA SUSP: 2.0000 | Freq: Every day | NASAL | Status: DC

## 2015-11-25 MED ORDER — AMOXICILLIN-POT CLAVULANATE 875-125 MG PO TABS: 1.0000 | ORAL_TABLET | Freq: Two times a day (BID) | ORAL | Status: DC

## 2015-11-25 NOTE — Patient Instructions (Signed)
We have sent in the antibiotic called augmentin which you take 1 pill twice a day with food for 1 week.   We have also sent in flonase which is the nose spray that helps with the inflammation and drainage. Use 2 sprays in each nostril daily.   Sinusitis, Adult Sinusitis is redness, soreness, and inflammation of the paranasal sinuses. Paranasal sinuses are air pockets within the bones of your face. They are located beneath your eyes, in the middle of your forehead, and above your eyes. In healthy paranasal sinuses, mucus is able to drain out, and air is able to circulate through them by way of your nose. However, when your paranasal sinuses are inflamed, mucus and air can become trapped. This can allow bacteria and other germs to grow and cause infection. Sinusitis can develop quickly and last only a short time (acute) or continue over a long period (chronic). Sinusitis that lasts for more than 12 weeks is considered chronic. CAUSES Causes of sinusitis include:  Allergies.  Structural abnormalities, such as displacement of the cartilage that separates your nostrils (deviated septum), which can decrease the air flow through your nose and sinuses and affect sinus drainage.  Functional abnormalities, such as when the small hairs (cilia) that line your sinuses and help remove mucus do not work properly or are not present. SIGNS AND SYMPTOMS Symptoms of acute and chronic sinusitis are the same. The primary symptoms are pain and pressure around the affected sinuses. Other symptoms include:  Upper toothache.  Earache.  Headache.  Bad breath.  Decreased sense of smell and taste.  A cough, which worsens when you are lying flat.  Fatigue.  Fever.  Thick drainage from your nose, which often is green and may contain pus (purulent).  Swelling and warmth over the affected sinuses. DIAGNOSIS Your health care provider will perform a physical exam. During your exam, your health care provider may  perform any of the following to help determine if you have acute sinusitis or chronic sinusitis:  Look in your nose for signs of abnormal growths in your nostrils (nasal polyps).  Tap over the affected sinus to check for signs of infection.  View the inside of your sinuses using an imaging device that has a light attached (endoscope). If your health care provider suspects that you have chronic sinusitis, one or more of the following tests may be recommended:  Allergy tests.  Nasal culture. A sample of mucus is taken from your nose, sent to a lab, and screened for bacteria.  Nasal cytology. A sample of mucus is taken from your nose and examined by your health care provider to determine if your sinusitis is related to an allergy. TREATMENT Most cases of acute sinusitis are related to a viral infection and will resolve on their own within 10 days. Sometimes, medicines are prescribed to help relieve symptoms of both acute and chronic sinusitis. These may include pain medicines, decongestants, nasal steroid sprays, or saline sprays. However, for sinusitis related to a bacterial infection, your health care provider will prescribe antibiotic medicines. These are medicines that will help kill the bacteria causing the infection. Rarely, sinusitis is caused by a fungal infection. In these cases, your health care provider will prescribe antifungal medicine. For some cases of chronic sinusitis, surgery is needed. Generally, these are cases in which sinusitis recurs more than 3 times per year, despite other treatments. HOME CARE INSTRUCTIONS  Drink plenty of water. Water helps thin the mucus so your sinuses can drain more  easily.  Use a humidifier.  Inhale steam 3-4 times a day (for example, sit in the bathroom with the shower running).  Apply a warm, moist washcloth to your face 3-4 times a day, or as directed by your health care provider.  Use saline nasal sprays to help moisten and clean your  sinuses.  Take medicines only as directed by your health care provider.  If you were prescribed either an antibiotic or antifungal medicine, finish it all even if you start to feel better. SEEK IMMEDIATE MEDICAL CARE IF:  You have increasing pain or severe headaches.  You have nausea, vomiting, or drowsiness.  You have swelling around your face.  You have vision problems.  You have a stiff neck.  You have difficulty breathing.   This information is not intended to replace advice given to you by your health care provider. Make sure you discuss any questions you have with your health care provider.   Document Released: 09/18/2005 Document Revised: 10/09/2014 Document Reviewed: 10/03/2011 Elsevier Interactive Patient Education [employer].

## 2015-11-25 NOTE — Assessment & Plan Note (Signed)
Rx for flonase and augmentin for the sinus infection. Call with worsening symptoms or no improvement.

## 2015-11-25 NOTE — Progress Notes (Signed)
   Subjective:    Patient ID: [name], female    DOB: 15-Nov-1955, 60 y.o.   MRN: [id number]  HPI The patient is a 60 YO female coming in for sinus problems. Going on for 1-2 weeks. Overall worsening. Pressure and headaches. Ear pain and feels like fluid. Some nasal drainage. No cough but sneezing a lot. Yellow sinus discharge from her nose. She is taking zyrtec over the counter without improvement. No fevers but chills. No sick contacts.   Review of Systems  Constitutional: Positive for chills. Negative for fever, activity change, appetite change, fatigue and unexpected weight change.  HENT: Positive for congestion, ear pain, postnasal drip, rhinorrhea, sinus pressure and sneezing. Negative for ear discharge, sore throat and trouble swallowing.   Eyes: Negative.   Respiratory: Negative for cough, chest tightness, shortness of breath and wheezing.   Cardiovascular: Negative.   Gastrointestinal: Negative.   Musculoskeletal: Negative.       Objective:   Physical Exam  Constitutional: She appears well-developed and well-nourished.  HENT:  Head: Normocephalic and atraumatic.  Nose: Nose normal.  Mouth/Throat: Oropharynx is clear and moist.  Ears with mild erythema in the canal, yellow crusting in the nose and sinus pressure frontal sinuses.   Eyes: EOM are normal.  Neck: Normal range of motion.  Cardiovascular: Normal rate and regular rhythm.   Pulmonary/Chest: Effort normal and breath sounds normal. No respiratory distress. She has no wheezes.  Abdominal: Soft. She exhibits no distension. There is no tenderness.  Musculoskeletal: She exhibits no edema.  Lymphadenopathy:    She has no cervical adenopathy.  Skin: Skin is warm and dry.   Filed Vitals:   11/25/15 1036  BP: 140/90  Pulse: 65  Temp: 98.2 F (36.8 C)  TempSrc: Oral  Resp: 18  Height: 5\' 6"  (1.676 m)  Weight: 220 lb (99.791 kg)  SpO2: 96%      Assessment & Plan:

## 2015-11-25 NOTE — Progress Notes (Signed)
Pre visit review using our clinic review tool, if applicable. No additional management support is needed unless otherwise documented below in the visit note. 

## 2016-02-17 ENCOUNTER — Other Ambulatory Visit: Payer: Self-pay | Admitting: Internal Medicine

## 2016-03-29 ENCOUNTER — Other Ambulatory Visit: Payer: Self-pay | Admitting: *Deleted

## 2016-03-29 MED ORDER — NEOMYCIN-POLYMYXIN-HC 3.5-10000-1 OT SOLN: 3.0000 [drp] | Freq: Three times a day (TID) | OTIC | Status: DC

## 2016-04-28 ENCOUNTER — Ambulatory Visit (INDEPENDENT_AMBULATORY_CARE_PROVIDER_SITE_OTHER): Payer: Managed Care, Other (non HMO) | Admitting: Internal Medicine

## 2016-04-28 ENCOUNTER — Encounter: Payer: Self-pay | Admitting: Internal Medicine

## 2016-04-28 ENCOUNTER — Other Ambulatory Visit (INDEPENDENT_AMBULATORY_CARE_PROVIDER_SITE_OTHER): Payer: Managed Care, Other (non HMO)

## 2016-04-28 ENCOUNTER — Ambulatory Visit: Payer: Managed Care, Other (non HMO) | Admitting: Internal Medicine

## 2016-04-28 VITALS — BP 138/88 | HR 67 | Temp 97.9°F | Resp 12 | Ht 66.0 in | Wt 213.0 lb

## 2016-04-28 DIAGNOSIS — M159 Polyosteoarthritis, unspecified: Secondary | ICD-10-CM

## 2016-04-28 DIAGNOSIS — I10 Essential (primary) hypertension: Secondary | ICD-10-CM

## 2016-04-28 DIAGNOSIS — M150 Primary generalized (osteo)arthritis: Secondary | ICD-10-CM

## 2016-04-28 DIAGNOSIS — E119 Type 2 diabetes mellitus without complications: Secondary | ICD-10-CM

## 2016-04-28 DIAGNOSIS — K219 Gastro-esophageal reflux disease without esophagitis: Secondary | ICD-10-CM

## 2016-04-28 LAB — COMPREHENSIVE METABOLIC PANEL
ALBUMIN: 4.4 g/dL (ref 3.5–5.2)
ALK PHOS: 79 U/L (ref 39–117)
ALT: 27 U/L (ref 0–35)
AST: 22 U/L (ref 0–37)
BILIRUBIN TOTAL: 0.7 mg/dL (ref 0.2–1.2)
BUN: 9 mg/dL (ref 6–23)
CALCIUM: 10.3 mg/dL (ref 8.4–10.5)
CO2: 27 mEq/L (ref 19–32)
Chloride: 102 mEq/L (ref 96–112)
Creatinine, Ser: 1.01 mg/dL (ref 0.40–1.20)
GFR: 71.82 mL/min (ref >60.00)
GLUCOSE: 114 mg/dL — AB (ref 70–99)
POTASSIUM: 4.7 meq/L (ref 3.5–5.1)
Sodium: 136 mEq/L (ref 135–145)
TOTAL PROTEIN: 8 g/dL (ref 6.0–8.3)

## 2016-04-28 LAB — CBC
HCT: 38.8 % (ref 36.0–46.0)
HEMOGLOBIN: 12.8 g/dL (ref 12.0–15.0)
MCHC: 33 g/dL (ref 30.0–36.0)
MCV: 84.2 fl (ref 78.0–100.0)
PLATELETS: 446 10*3/uL — AB (ref 150.0–400.0)
RBC: 4.61 Mil/uL (ref 3.87–5.11)
RDW: 14.4 % (ref 11.5–15.5)
WBC: 7.1 10*3/uL (ref 4.0–10.5)

## 2016-04-28 LAB — HEMOGLOBIN A1C: HEMOGLOBIN A1C: 7.3 % — AB (ref 4.6–6.5)

## 2016-04-28 MED ORDER — PANTOPRAZOLE SODIUM 40 MG PO TBEC: 40.0000 mg | DELAYED_RELEASE_TABLET | Freq: Every day | ORAL | 3 refills | Status: DC

## 2016-04-28 MED ORDER — TRAMADOL HCL 50 MG PO TABS: 50.0000 mg | ORAL_TABLET | Freq: Every day | ORAL | 0 refills | Status: DC | PRN

## 2016-04-28 NOTE — Assessment & Plan Note (Signed)
Taking metformin 500 mg BID and checking HgA1c today and foot exam done. Adjust as needed. Not complicated.

## 2016-04-28 NOTE — Assessment & Plan Note (Signed)
BP at goal on spironolactone, lasix, amlodipine. Checking CMP and adjust as needed.

## 2016-04-28 NOTE — Assessment & Plan Note (Signed)
Refill protonix since her GI doctor has left and their office has not refilled her medication. Reminded him about the risks of long term therapy.

## 2016-04-28 NOTE — Progress Notes (Signed)
   Subjective:    Patient ID: [name], female    DOB: 12-May-1956, 60 y.o.   MRN: [id number]  HPI The patient is a 60 YO female coming in for follow up of her diabetes (on metformin, not complicated) and her blood pressure (at goal on her amlodipine and lasix and spironolactone, not known to be complicated), and her knee pain (had been taking voltaren in the past and not working well, wants something else, needs to get her left knee replaced).   Review of Systems  Constitutional: Negative for activity change, appetite change, fatigue, fever and unexpected weight change.  HENT: Negative for congestion, ear discharge, ear pain, postnasal drip, rhinorrhea, sinus pressure, sore throat and trouble swallowing.   Respiratory: Negative for cough, chest tightness, shortness of breath and wheezing.   Cardiovascular: Negative for chest pain, palpitations and leg swelling.  Gastrointestinal: Negative for abdominal distention, abdominal pain, constipation, diarrhea and nausea.  Musculoskeletal: Positive for arthralgias and gait problem. Negative for back pain.  Skin: Negative.   Neurological: Negative for dizziness, speech difficulty, weakness, light-headedness and numbness.  Psychiatric/Behavioral: Negative for behavioral problems, decreased concentration and sleep disturbance. The patient is not nervous/anxious.       Objective:   Physical Exam  Constitutional: She appears well-developed and well-nourished.  HENT:  Head: Normocephalic and atraumatic.  Nose: Nose normal.  Mouth/Throat: Oropharynx is clear and moist.  Eyes: EOM are normal.  Neck: Normal range of motion.  Cardiovascular: Normal rate and regular rhythm.   Pulmonary/Chest: Effort normal and breath sounds normal. No respiratory distress. She has no wheezes.  Abdominal: Soft. She exhibits no distension. There is no tenderness.  Musculoskeletal: She exhibits no edema.  Skin: Skin is warm and dry.  See foot exam   Vitals:   04/28/16 0935  BP: 138/88  Pulse: 67  Resp: 12  Temp: 97.9 F (36.6 C)  TempSrc: Oral  SpO2: 98%  Weight: 213 lb (96.6 kg)  Height: 5\' 6"  (1.676 m)      Assessment & Plan:

## 2016-04-28 NOTE — Progress Notes (Signed)
Pre visit review using our clinic review tool, if applicable. No additional management support is needed unless otherwise documented below in the visit note. 

## 2016-04-28 NOTE — Assessment & Plan Note (Signed)
She will continue taking voltaren for the pain and rx for tramadol daily prn to help some. Talked to her about the fact that if her orthopedic has recommended surgery there is not a lot else I can do to help this improve.

## 2016-04-28 NOTE — Patient Instructions (Signed)
We need to check the blood work today and call you back with the results.   We have given you a prescription for tramadol that you can use daily for pain as needed.   Diabetes and Standards of Medical Care Diabetes is complicated. You may find that your diabetes team includes a dietitian, nurse, diabetes educator, eye doctor, and more. To help everyone know what is going on and to help you get the care you deserve, the following schedule of care was developed to help keep you on track. Below are the tests, exams, vaccines, medicines, education, and plans you will need. HbA1c test This test shows how well you have controlled your glucose over the past 2-3 months. It is used to see if your diabetes management plan needs to be adjusted.   It is performed at least 2 times a year if you are meeting treatment goals.  It is performed 4 times a year if therapy has changed or if you are not meeting treatment goals. Blood pressure test  This test is performed at every routine medical visit. The goal is less than 140/90 mm Hg for most people, but 130/80 mm Hg in some cases. Ask your health care provider about your goal. Dental exam  Follow up with the dentist regularly. Eye exam  If you are diagnosed with type 1 diabetes as a child, get an exam upon reaching the age of 79 years or older and having had diabetes for 3-5 years. Yearly eye exams are recommended after that initial eye exam.  If you are diagnosed with type 1 diabetes as an adult, get an exam within 5 years of diagnosis and then yearly.  If you are diagnosed with type 2 diabetes, get an exam as soon as possible after the diagnosis and then yearly. Foot care exam  Visual foot exams are performed at every routine medical visit. The exams check for cuts, injuries, or other problems with the feet.  You should have a complete foot exam performed every year. This exam includes an inspection of the structure and skin of your feet, a check of the  pulses in your feet, and a check of the sensation in your feet.  Type 1 diabetes: The first exam is performed 5 years after diagnosis.  Type 2 diabetes: The first exam is performed at the time of diagnosis.  Check your feet nightly for cuts, injuries, or other problems with your feet. Tell your health care provider if anything is not healing. Kidney function test (urine microalbumin)  This test is performed once a year.  Type 1 diabetes: The first test is performed 5 years after diagnosis.  Type 2 diabetes: The first test is performed at the time of diagnosis.  A serum creatinine and estimated glomerular filtration rate (eGFR) test is done once a year to assess the level of chronic kidney disease (CKD), if present. Lipid profile (cholesterol, HDL, LDL, triglycerides)  Performed every 5 years for most people.  The goal for LDL is less than 100 mg/dL. If you are at high risk, the goal is less than 70 mg/dL.  The goal for HDL is 40 mg/dL-50 mg/dL for men and 50 mg/dL-60 mg/dL for women. An HDL cholesterol of 60 mg/dL or higher gives some protection against heart disease.  The goal for triglycerides is less than 150 mg/dL. Immunizations  The flu (influenza) vaccine is recommended yearly for every person 7 months of age or older who has diabetes.  The pneumonia (pneumococcal) vaccine is  recommended for every person 49 years of age or older who has diabetes. Adults 36 years of age or older may receive the pneumonia vaccine as a series of two separate shots.  The hepatitis B vaccine is recommended for adults shortly after they have been diagnosed with diabetes.  The Tdap (tetanus, diphtheria, and pertussis) vaccine should be given:  According to normal childhood vaccination schedules, for children.  Every 10 years, for adults who have diabetes. Diabetes self-management education  Education is recommended at diagnosis and ongoing as needed. Treatment plan  Your treatment plan is  reviewed at every medical visit.   This information is not intended to replace advice given to you by your health care provider. Make sure you discuss any questions you have with your health care provider.   Document Released: 07/16/2009 Document Revised: 10/09/2014 Document Reviewed: 02/18/2013 Elsevier Interactive Patient Education [employer].

## 2016-05-03 ENCOUNTER — Telehealth: Payer: Self-pay | Admitting: Internal Medicine

## 2016-05-03 NOTE — Telephone Encounter (Signed)
Pt called in and said that the    diclofenac (VOLTAREN) 75 MG EC tablet [id number]  Helps her more then the tramadol .  Pt would like the diclofenac called in.

## 2016-05-04 ENCOUNTER — Other Ambulatory Visit: Payer: Self-pay | Admitting: Geriatric Medicine

## 2016-05-04 MED ORDER — DICLOFENAC SODIUM 75 MG PO TBEC: 75.0000 mg | DELAYED_RELEASE_TABLET | Freq: Every day | ORAL | 3 refills | Status: DC

## 2016-05-04 NOTE — Telephone Encounter (Signed)
Sent to pharmacy 

## 2016-05-27 ENCOUNTER — Encounter: Payer: Self-pay | Admitting: Internal Medicine

## 2016-05-29 ENCOUNTER — Other Ambulatory Visit: Payer: Self-pay | Admitting: Internal Medicine

## 2016-05-29 NOTE — Telephone Encounter (Signed)
Faxed to [facility].

## 2016-05-30 ENCOUNTER — Ambulatory Visit: Payer: Self-pay | Admitting: Orthopedic Surgery

## 2016-06-13 ENCOUNTER — Ambulatory Visit: Payer: Self-pay | Admitting: Orthopedic Surgery

## 2016-06-13 NOTE — H&P (Signed)
TOTAL KNEE ADMISSION H&P  Patient is being admitted for left total knee arthroplasty.  Subjective:  Chief Complaint:left knee pain.  HPI: [name], 60 y.o. female, has a history of pain and functional disability in the left knee due to arthritis and has failed non-surgical conservative treatments for greater than 12 weeks to includeNSAID's and/or analgesics, viscosupplementation injections, flexibility and strengthening excercises, use of assistive devices, weight reduction as appropriate and activity modification.  Onset of symptoms was gradual, starting 3 years ago with gradually worsening course since that time. The patient noted no past surgery on the left knee(s).  Patient currently rates pain in the left knee(s) at 10 out of 10 with activity. Patient has night pain, worsening of pain with activity and weight bearing, pain that interferes with activities of daily living, pain with passive range of motion and joint swelling.  Patient has evidence of subchondral cysts, subchondral sclerosis, periarticular osteophytes and joint space narrowing by imaging studies. There is no active infection.  Patient Active Problem List   Diagnosis Date Noted  . Sinusitis 11/25/2015  . Diabetes mellitus type 2, uncomplicated ([place]) [id number]  . GERD (gastroesophageal reflux disease) 04/16/2015  . Constipation 04/16/2015  . Dyspepsia 04/16/2015  . Chronic venous insufficiency 02/10/2015  . Otitis, externa, infective 02/10/2015  . Osteoarthritis 02/10/2015  . Obesity 02/10/2015  . OSA (obstructive sleep apnea) 09/05/2012  . Essential hypertension, benign 12/31/2011   Past Medical History:  Diagnosis Date  . Breast cancer ([place]) 2011  . Diabetes mellitus, type II ([place])   . Hypertension   . Osteoarthritis of both knees     Past Surgical History:  Procedure Laterality Date  . ABDOMINAL HYSTERECTOMY    . BLADDER SUSPENSION    . MASTECTOMY, PARTIAL    . MENISCUS REPAIR    . OOPHORECTOMY        (Not in a hospital admission) No Known Allergies  Social History  Substance Use Topics  . Smoking status: Former Smoker    Packs/day: 0.50    Years: 20.00    Types: Cigarettes    Quit date: 10/14/2013  . Smokeless tobacco: Never Used  . Alcohol use No    Family History  Problem Relation Age of Onset  . Adopted: Yes     Review of Systems  Constitutional: Negative.   HENT: Negative.   Eyes: Positive for blurred vision.  Respiratory: Negative.   Cardiovascular: Negative.   Gastrointestinal: Negative.   Genitourinary: Negative.   Musculoskeletal: Positive for joint pain.  Skin: Negative.   Neurological: Negative.   Endo/Heme/Allergies: Negative.   Psychiatric/Behavioral: Negative.     Objective:  Physical Exam  Vitals reviewed. Constitutional: She is oriented to person, place, and time. She appears well-developed and well-nourished.  HENT:  Head: Normocephalic and atraumatic.  Eyes: Conjunctivae and EOM are normal. Pupils are equal, round, and reactive to light.  Neck: Normal range of motion. Neck supple.  Cardiovascular: Normal rate, regular rhythm and intact distal pulses.   Respiratory: Effort normal. No respiratory distress.  GI: Soft. Bowel sounds are normal. She exhibits no distension.  Genitourinary:  Genitourinary Comments: deferred  Musculoskeletal:       Left knee: She exhibits decreased range of motion, effusion and deformity. Tenderness found. Medial joint line and lateral joint line tenderness noted.  ROM 0-110  Neurological: She is alert and oriented to person, place, and time. She has normal reflexes.  Skin: Skin is warm and dry.  Psychiatric: She has a normal mood and affect. Her  behavior is normal. Judgment and thought content normal.    Vital signs in last 24 hours: @VSRANGES @  Labs:   Estimated body mass index is 34.38 kg/m as calculated from the following:   Height as of 04/28/16: 5\' 6"  (1.676 m).   Weight as of 04/28/16: 96.6 kg (213  lb).   Imaging Review Plain radiographs demonstrate severe degenerative joint disease of the bilaterally knee(s). The overall alignment issignificant varus. The bone quality appears to be adequate for age and reported activity level.  Assessment/Plan:  End stage arthritis, left knee   The patient history, physical examination, clinical judgment of the provider and imaging studies are consistent with end stage degenerative joint disease of the left knee(s) and total knee arthroplasty is deemed medically necessary. The treatment options including medical management, injection therapy arthroscopy and arthroplasty were discussed at length. The risks and benefits of total knee arthroplasty were presented and reviewed. The risks due to aseptic loosening, infection, stiffness, patella tracking problems, thromboembolic complications and other imponderables were discussed. The patient acknowledged the explanation, agreed to proceed with the plan and consent was signed. Patient is being admitted for inpatient treatment for surgery, pain control, PT, OT, prophylactic antibiotics, VTE prophylaxis, progressive ambulation and ADL's and discharge planning. The patient is planning to be discharged home with home health services

## 2016-06-19 NOTE — Patient Instructions (Addendum)
[name]  06/19/2016   Your procedure is scheduled on: Thursday 06/29/2016  Report to [facility]  Entrance take [facility]  elevators to 3rd floor to  [place] at   1045 AM.  Call this number if you have problems the morning of surgery [phone]   Remember: ONLY 1 PERSON MAY GO WITH YOU TO SHORT STAY TO GET  READY MORNING OF [name]   Do not eat food or drink liquids :After Midnight.     Take these medicines the morning of surgery with A SIP OF WATER: Amlodipine, Pantoprazole(Protonix), use eye drops              DO NOT TAKE ANY DIABETIC MEDICATIONS DAY OF YOUR SURGERY!                               You may not have any metal on your body including hair pins and              piercings  Do not wear jewelry, make-up, lotions, powders or perfumes, deodorant             Do not wear nail polish.  Do not shave  48 hours prior to surgery.              Men may shave face and neck.   Do not bring valuables to the hospital. [place].  Contacts, dentures or bridgework may not be worn into surgery.  Leave suitcase in the car. After surgery it may be brought to your room.                  Please read over the following fact sheets you were given: _____________________________________________________________________             [facility] - Preparing for Surgery Before surgery, you can play an important role.  Because skin is not sterile, your skin needs to be as free of germs as possible.  You can reduce the number of germs on your skin by washing with CHG (chlorahexidine gluconate) soap before surgery.  CHG is an antiseptic cleaner which kills germs and bonds with the skin to continue killing germs even after washing. Please DO NOT use if you have an allergy to CHG or antibacterial soaps.  If your skin becomes reddened/irritated stop using the CHG and inform your nurse when you arrive at Short  Stay. Do not shave (including legs and underarms) for at least 48 hours prior to the first CHG shower.  You may shave your face/neck. Please follow these instructions carefully:  1.  Shower with CHG Soap the night before surgery and the  morning of Surgery.  2.  If you choose to wash your hair, wash your hair first as usual with your  normal  shampoo.  3.  After you shampoo, rinse your hair and body thoroughly to remove the  shampoo.                           4.  Use CHG as you would any other liquid soap.  You can apply chg directly  to the skin and wash  Gently with a scrungie or clean washcloth.  5.  Apply the CHG Soap to your body ONLY FROM THE NECK DOWN.   Do not use on face/ open                           Wound or open sores. Avoid contact with eyes, ears mouth and genitals (private parts).                       Wash face,  Genitals (private parts) with your normal soap.             6.  Wash thoroughly, paying special attention to the area where your surgery  will be performed.  7.  Thoroughly rinse your body with warm water from the neck down.  8.  DO NOT shower/wash with your normal soap after using and rinsing off  the CHG Soap.                9.  Pat yourself dry with a clean towel.            10.  Wear clean pajamas.            11.  Place clean sheets on your bed the night of your first shower and do not  sleep with pets. Day of Surgery : Do not apply any lotions/deodorants the morning of surgery.  Please wear clean clothes to the hospital/surgery center.  FAILURE TO FOLLOW THESE INSTRUCTIONS MAY RESULT IN THE CANCELLATION OF YOUR SURGERY PATIENT SIGNATURE_________________________________  NURSE SIGNATURE__________________________________  ________________________________________________________________________   Adam Phenix  An incentive spirometer is a tool that can help keep your lungs clear and active. This tool measures how well you are  filling your lungs with each breath. Taking long deep breaths may help reverse or decrease the chance of developing breathing (pulmonary) problems (especially infection) following:  A long period of time when you are unable to move or be active. BEFORE THE PROCEDURE   If the spirometer includes an indicator to show your best effort, your nurse or respiratory therapist will set it to a desired goal.  If possible, sit up straight or lean slightly forward. Try not to slouch.  Hold the incentive spirometer in an upright position. INSTRUCTIONS FOR USE  1. Sit on the edge of your bed if possible, or sit up as far as you can in bed or on a chair. 2. Hold the incentive spirometer in an upright position. 3. Breathe out normally. 4. Place the mouthpiece in your mouth and seal your lips tightly around it. 5. Breathe in slowly and as deeply as possible, raising the piston or the ball toward the top of the column. 6. Hold your breath for 3-5 seconds or for as long as possible. Allow the piston or ball to fall to the bottom of the column. 7. Remove the mouthpiece from your mouth and breathe out normally. 8. Rest for a few seconds and repeat Steps 1 through 7 at least 10 times every 1-2 hours when you are awake. Take your time and take a few normal breaths between deep breaths. 9. The spirometer may include an indicator to show your best effort. Use the indicator as a goal to work toward during each repetition. 10. After each set of 10 deep breaths, practice coughing to be sure your lungs are clear. If you have an incision (the cut made at the time of surgery),  support your incision when coughing by placing a pillow or rolled up towels firmly against it. Once you are able to get out of bed, walk around indoors and cough well. You may stop using the incentive spirometer when instructed by your caregiver.  RISKS AND COMPLICATIONS  Take your time so you do not get dizzy or light-headed.  If you are in pain,  you may need to take or ask for pain medication before doing incentive spirometry. It is harder to take a deep breath if you are having pain. AFTER USE  Rest and breathe slowly and easily.  It can be helpful to keep track of a log of your progress. Your caregiver can provide you with a simple table to help with this. If you are using the spirometer at home, follow these instructions: [place] IF:   You are having difficultly using the spirometer.  You have trouble using the spirometer as often as instructed.  Your pain medication is not giving enough relief while using the spirometer.  You develop fever of 100.5 F (38.1 C) or higher. SEEK IMMEDIATE MEDICAL CARE IF:   You cough up bloody sputum that had not been present before.  You develop fever of 102 F (38.9 C) or greater.  You develop worsening pain at or near the incision site. MAKE SURE YOU:   Understand these instructions.  Will watch your condition.  Will get help right away if you are not doing well or get worse. Document Released: 01/29/2007 Document Revised: 12/11/2011 Document Reviewed: 04/01/2007 ExitCare Patient Information 2014 ExitCare, [place].   ________________________________________________________________________  WHAT IS A BLOOD TRANSFUSION? Blood Transfusion Information  A transfusion is the replacement of blood or some of its parts. Blood is made up of multiple cells which provide different functions.  Red blood cells carry oxygen and are used for blood loss replacement.  White blood cells fight against infection.  Platelets control bleeding.  Plasma helps clot blood.  Other blood products are available for specialized needs, such as hemophilia or other clotting disorders. BEFORE THE TRANSFUSION  Who gives blood for transfusions?   Healthy volunteers who are fully evaluated to make sure their blood is safe. This is blood bank blood. Transfusion therapy is the safest it has ever  been in the practice of medicine. Before blood is taken from a donor, a complete history is taken to make sure that person has no history of diseases nor engages in risky social behavior (examples are intravenous drug use or sexual activity with multiple partners). The donor's travel history is screened to minimize risk of transmitting infections, such as malaria. The donated blood is tested for signs of infectious diseases, such as HIV and hepatitis. The blood is then tested to be sure it is compatible with you in order to minimize the chance of a transfusion reaction. If you or a relative donates blood, this is often done in anticipation of surgery and is not appropriate for emergency situations. It takes many days to process the donated blood. RISKS AND COMPLICATIONS Although transfusion therapy is very safe and saves many lives, the main dangers of transfusion include:   Getting an infectious disease.  Developing a transfusion reaction. This is an allergic reaction to something in the blood you were given. Every precaution is taken to prevent this. The decision to have a blood transfusion has been considered carefully by your caregiver before blood is given. Blood is not given unless the benefits outweigh the risks. AFTER THE TRANSFUSION  Right after receiving a blood transfusion, you will usually feel much better and more energetic. This is especially true if your red blood cells have gotten low (anemic). The transfusion raises the level of the red blood cells which carry oxygen, and this usually causes an energy increase.  The nurse administering the transfusion will monitor you carefully for complications. HOME CARE INSTRUCTIONS  No special instructions are needed after a transfusion. You may find your energy is better. Speak with your caregiver about any limitations on activity for underlying diseases you may have. SEEK MEDICAL CARE IF:   Your condition is not improving after your  transfusion.  You develop redness or irritation at the intravenous (IV) site. SEEK IMMEDIATE MEDICAL CARE IF:  Any of the following symptoms occur over the next 12 hours:  Shaking chills.  You have a temperature by mouth above 102 F (38.9 C), not controlled by medicine.  Chest, back, or muscle pain.  People around you feel you are not acting correctly or are confused.  Shortness of breath or difficulty breathing.  Dizziness and fainting.  You get a rash or develop hives.  You have a decrease in urine output.  Your urine turns a dark color or changes to pink, red, or brown. Any of the following symptoms occur over the next 10 days:  You have a temperature by mouth above 102 F (38.9 C), not controlled by medicine.  Shortness of breath.  Weakness after normal activity.  The white part of the eye turns yellow (jaundice).  You have a decrease in the amount of urine or are urinating less often.  Your urine turns a dark color or changes to pink, red, or brown. Document Released: 09/15/2000 Document Revised: 12/11/2011 Document Reviewed: 05/04/2008 [facility] Patient Information 2014 [place], [place].  _______________________________________________________________________

## 2016-06-19 NOTE — Progress Notes (Signed)
04/28/2016-pre-operative clearance from Dr. Pricilla Holm on chart.

## 2016-06-20 ENCOUNTER — Encounter (HOSPITAL_COMMUNITY): Payer: Self-pay

## 2016-06-20 ENCOUNTER — Encounter (HOSPITAL_COMMUNITY)
Admission: RE | Admit: 2016-06-20 | Discharge: 2016-06-20 | Disposition: A | Discharge status: Home / Self Care | Payer: Managed Care, Other (non HMO) | Source: Ambulatory Visit | Attending: Orthopedic Surgery | Admitting: Orthopedic Surgery

## 2016-06-20 DIAGNOSIS — I10 Essential (primary) hypertension: Secondary | ICD-10-CM | POA: Diagnosis not present

## 2016-06-20 DIAGNOSIS — Z01818 Encounter for other preprocedural examination: Secondary | ICD-10-CM | POA: Diagnosis not present

## 2016-06-20 DIAGNOSIS — I4510 Unspecified right bundle-branch block: Secondary | ICD-10-CM | POA: Insufficient documentation

## 2016-06-20 DIAGNOSIS — E119 Type 2 diabetes mellitus without complications: Secondary | ICD-10-CM | POA: Diagnosis not present

## 2016-06-20 DIAGNOSIS — R9431 Abnormal electrocardiogram [ECG] [EKG]: Secondary | ICD-10-CM | POA: Diagnosis not present

## 2016-06-20 HISTORY — DX: Gastro-esophageal reflux disease without esophagitis: K21.9

## 2016-06-20 HISTORY — DX: Pneumonia, unspecified organism: J18.9

## 2016-06-20 LAB — SURGICAL PCR SCREEN
MRSA, PCR: NEGATIVE
Staphylococcus aureus: NEGATIVE

## 2016-06-20 LAB — CBC
HCT: 43.1 % (ref 36.0–46.0)
Hemoglobin: 13.8 g/dL (ref 12.0–15.0)
MCH: 27.8 pg (ref 26.0–34.0)
MCHC: 32 g/dL (ref 30.0–36.0)
MCV: 86.7 fL (ref 78.0–100.0)
PLATELETS: 398 10*3/uL (ref 150–400)
RBC: 4.97 MIL/uL (ref 3.87–5.11)
RDW: 13.8 % (ref 11.5–15.5)
WBC: 5.2 10*3/uL (ref 4.0–10.5)

## 2016-06-20 LAB — TYPE AND SCREEN
ABO/RH(D): O POS
Antibody Screen: POSITIVE
DAT, IGG: NEGATIVE
PT AG TYPE: NEGATIVE

## 2016-06-20 LAB — BASIC METABOLIC PANEL
Anion gap: 8 (ref 5–15)
BUN: 12 mg/dL (ref 6–20)
CHLORIDE: 101 mmol/L (ref 101–111)
CO2: 26 mmol/L (ref 22–32)
CREATININE: 1.07 mg/dL — AB (ref 0.44–1.00)
Calcium: 10.4 mg/dL — ABNORMAL HIGH (ref 8.9–10.3)
GFR calc Af Amer: >60 mL/min (ref >60)
GFR calc non Af Amer: 55 mL/min — ABNORMAL LOW (ref >60)
GLUCOSE: 141 mg/dL — AB (ref 65–99)
Potassium: 4.6 mmol/L (ref 3.5–5.1)
SODIUM: 135 mmol/L (ref 135–145)

## 2016-06-20 LAB — ABO/RH: ABO/RH(D): O POS

## 2016-06-21 LAB — HEMOGLOBIN A1C
HEMOGLOBIN A1C: 6.9 % — AB (ref 4.8–5.6)
MEAN PLASMA GLUCOSE: 151 mg/dL

## 2016-06-22 ENCOUNTER — Other Ambulatory Visit: Payer: Self-pay | Admitting: Internal Medicine

## 2016-06-22 DIAGNOSIS — I5022 Chronic systolic (congestive) heart failure: Secondary | ICD-10-CM

## 2016-06-29 ENCOUNTER — Inpatient Hospital Stay (HOSPITAL_COMMUNITY): Payer: Managed Care, Other (non HMO) | Admitting: Certified Registered Nurse Anesthetist

## 2016-06-29 ENCOUNTER — Encounter (HOSPITAL_COMMUNITY)
Admission: RE | Disposition: A | Discharge status: Home Health | Payer: Self-pay | Source: Ambulatory Visit | Attending: Orthopedic Surgery

## 2016-06-29 ENCOUNTER — Inpatient Hospital Stay (HOSPITAL_COMMUNITY): Payer: Managed Care, Other (non HMO)

## 2016-06-29 ENCOUNTER — Encounter (HOSPITAL_COMMUNITY): Payer: Self-pay

## 2016-06-29 ENCOUNTER — Inpatient Hospital Stay (HOSPITAL_COMMUNITY)
Admission: RE | Admit: 2016-06-29 | Discharge: 2016-06-30 | DRG: 470 | Disposition: A | Discharge status: Home Health | Payer: Managed Care, Other (non HMO) | Source: Ambulatory Visit | Attending: Orthopedic Surgery | Admitting: Orthopedic Surgery

## 2016-06-29 DIAGNOSIS — M1712 Unilateral primary osteoarthritis, left knee: Secondary | ICD-10-CM | POA: Diagnosis present

## 2016-06-29 DIAGNOSIS — Z7982 Long term (current) use of aspirin: Secondary | ICD-10-CM

## 2016-06-29 DIAGNOSIS — E119 Type 2 diabetes mellitus without complications: Secondary | ICD-10-CM | POA: Diagnosis present

## 2016-06-29 DIAGNOSIS — Z09 Encounter for follow-up examination after completed treatment for conditions other than malignant neoplasm: Secondary | ICD-10-CM

## 2016-06-29 DIAGNOSIS — Z87891 Personal history of nicotine dependence: Secondary | ICD-10-CM | POA: Diagnosis not present

## 2016-06-29 DIAGNOSIS — G4733 Obstructive sleep apnea (adult) (pediatric): Secondary | ICD-10-CM | POA: Diagnosis present

## 2016-06-29 DIAGNOSIS — K219 Gastro-esophageal reflux disease without esophagitis: Secondary | ICD-10-CM | POA: Diagnosis present

## 2016-06-29 DIAGNOSIS — Z6834 Body mass index (BMI) 34.0-34.9, adult: Secondary | ICD-10-CM | POA: Diagnosis not present

## 2016-06-29 DIAGNOSIS — I739 Peripheral vascular disease, unspecified: Secondary | ICD-10-CM | POA: Diagnosis present

## 2016-06-29 DIAGNOSIS — E669 Obesity, unspecified: Secondary | ICD-10-CM | POA: Diagnosis present

## 2016-06-29 DIAGNOSIS — I10 Essential (primary) hypertension: Secondary | ICD-10-CM | POA: Diagnosis present

## 2016-06-29 DIAGNOSIS — Z79899 Other long term (current) drug therapy: Secondary | ICD-10-CM

## 2016-06-29 DIAGNOSIS — M170 Bilateral primary osteoarthritis of knee: Principal | ICD-10-CM | POA: Diagnosis present

## 2016-06-29 DIAGNOSIS — Z853 Personal history of malignant neoplasm of breast: Secondary | ICD-10-CM

## 2016-06-29 DIAGNOSIS — M25562 Pain in left knee: Secondary | ICD-10-CM | POA: Diagnosis present

## 2016-06-29 HISTORY — PX: KNEE ARTHROPLASTY: SHX992

## 2016-06-29 LAB — GLUCOSE, CAPILLARY
GLUCOSE-CAPILLARY: 167 mg/dL — AB (ref 65–99)
GLUCOSE-CAPILLARY: 221 mg/dL — AB (ref 65–99)
Glucose-Capillary: 130 mg/dL — ABNORMAL HIGH (ref 65–99)

## 2016-06-29 SURGERY — ARTHROPLASTY, KNEE, TOTAL, USING IMAGELESS COMPUTER-ASSISTED NAVIGATION
Anesthesia: Spinal | Site: Knee | Laterality: Left

## 2016-06-29 MED ORDER — PHENYLEPHRINE HCL 10 MG/ML IJ SOLN
INTRAVENOUS | Status: DC | PRN
  Administered 2016-06-29: 50 ug/min via INTRAVENOUS

## 2016-06-29 MED ORDER — SPIRONOLACTONE 25 MG PO TABS
25.0000 mg | ORAL_TABLET | Freq: Every day | ORAL | Status: DC
  Administered 2016-06-30: 25 mg via ORAL
  Filled 2016-06-29 (×2): qty 1

## 2016-06-29 MED ORDER — DEXAMETHASONE SODIUM PHOSPHATE 10 MG/ML IJ SOLN
10.0000 mg | Freq: Once | INTRAMUSCULAR | Status: AC
  Administered 2016-06-30: 10 mg via INTRAVENOUS
  Filled 2016-06-29: qty 1

## 2016-06-29 MED ORDER — DOCUSATE SODIUM 100 MG PO CAPS
100.0000 mg | ORAL_CAPSULE | Freq: Two times a day (BID) | ORAL | Status: DC
  Administered 2016-06-29 – 2016-06-30 (×2): 100 mg via ORAL
  Filled 2016-06-29 (×2): qty 1

## 2016-06-29 MED ORDER — SENNA 8.6 MG PO TABS
2.0000 | ORAL_TABLET | Freq: Every day | ORAL | Status: DC
  Administered 2016-06-29: 17.2 mg via ORAL
  Filled 2016-06-29: qty 2

## 2016-06-29 MED ORDER — SODIUM CHLORIDE 0.9 % IV SOLN
1000.0000 mg | Freq: Once | INTRAVENOUS | Status: AC
  Administered 2016-06-29: 1000 mg via INTRAVENOUS
  Filled 2016-06-29: qty 10

## 2016-06-29 MED ORDER — PROPOFOL 10 MG/ML IV BOLUS
INTRAVENOUS | Status: AC
  Filled 2016-06-29: qty 20

## 2016-06-29 MED ORDER — POLYETHYLENE GLYCOL 3350 17 G PO PACK
17.0000 g | PACK | Freq: Every day | ORAL | Status: DC | PRN
Start: 2016-06-29 — End: 2016-06-30

## 2016-06-29 MED ORDER — METOCLOPRAMIDE HCL 5 MG/ML IJ SOLN
INTRAMUSCULAR | Status: AC
  Filled 2016-06-29: qty 2

## 2016-06-29 MED ORDER — KETOROLAC TROMETHAMINE 15 MG/ML IJ SOLN
15.0000 mg | Freq: Four times a day (QID) | INTRAMUSCULAR | Status: AC
  Administered 2016-06-29 – 2016-06-30 (×4): 15 mg via INTRAVENOUS
  Filled 2016-06-29 (×4): qty 1

## 2016-06-29 MED ORDER — LACTATED RINGERS IV SOLN
INTRAVENOUS | Status: DC | PRN
  Administered 2016-06-29 (×2): via INTRAVENOUS

## 2016-06-29 MED ORDER — ACETAMINOPHEN 650 MG RE SUPP: 650.0000 mg | Freq: Four times a day (QID) | RECTAL | Status: DC | PRN

## 2016-06-29 MED ORDER — PHENOL 1.4 % MT LIQD
1.0000 | OROMUCOSAL | Status: DC | PRN
Start: 2016-06-29 — End: 2016-06-30

## 2016-06-29 MED ORDER — DIPHENHYDRAMINE HCL 12.5 MG/5ML PO ELIX
12.5000 mg | ORAL_SOLUTION | ORAL | Status: DC | PRN
  Administered 2016-06-30: 12.5 mg via ORAL
  Filled 2016-06-29: qty 5

## 2016-06-29 MED ORDER — METFORMIN HCL 500 MG PO TABS
500.0000 mg | ORAL_TABLET | Freq: Two times a day (BID) | ORAL | Status: DC
  Administered 2016-06-30: 500 mg via ORAL
  Filled 2016-06-29: qty 1

## 2016-06-29 MED ORDER — MENTHOL 3 MG MT LOZG: 1.0000 | LOZENGE | OROMUCOSAL | Status: DC | PRN

## 2016-06-29 MED ORDER — BUPIVACAINE-EPINEPHRINE 0.5% -1:200000 IJ SOLN
INTRAMUSCULAR | Status: AC
  Filled 2016-06-29: qty 1

## 2016-06-29 MED ORDER — ACETAMINOPHEN 325 MG PO TABS: 650.0000 mg | ORAL_TABLET | Freq: Four times a day (QID) | ORAL | Status: DC | PRN

## 2016-06-29 MED ORDER — SODIUM CHLORIDE 0.9 % IR SOLN
Status: DC | PRN
  Administered 2016-06-29: 3000 mL

## 2016-06-29 MED ORDER — DEXTROSE 5 % IV SOLN
500.0000 mg | Freq: Four times a day (QID) | INTRAVENOUS | Status: DC | PRN
  Administered 2016-06-29: 500 mg via INTRAVENOUS
  Filled 2016-06-29: qty 5
  Filled 2016-06-29: qty 550

## 2016-06-29 MED ORDER — KETOROLAC TROMETHAMINE 30 MG/ML IJ SOLN
INTRAMUSCULAR | Status: DC | PRN
  Administered 2016-06-29: 30 mg via INTRA_ARTICULAR

## 2016-06-29 MED ORDER — CEFAZOLIN SODIUM-DEXTROSE 2-4 GM/100ML-% IV SOLN
INTRAVENOUS | Status: AC
  Filled 2016-06-29: qty 100

## 2016-06-29 MED ORDER — ONDANSETRON HCL 4 MG PO TABS: 4.0000 mg | ORAL_TABLET | Freq: Four times a day (QID) | ORAL | Status: DC | PRN

## 2016-06-29 MED ORDER — SODIUM CHLORIDE 0.9 % IV SOLN
INTRAVENOUS | Status: DC
  Administered 2016-06-29 – 2016-06-30 (×2): via INTRAVENOUS

## 2016-06-29 MED ORDER — ISOPROPYL ALCOHOL 70 % SOLN
Status: DC | PRN
  Administered 2016-06-29: 1 via TOPICAL

## 2016-06-29 MED ORDER — ALBUTEROL SULFATE HFA 108 (90 BASE) MCG/ACT IN AERS
INHALATION_SPRAY | RESPIRATORY_TRACT | Status: AC
  Filled 2016-06-29: qty 6.7

## 2016-06-29 MED ORDER — PHENYLEPHRINE 40 MCG/ML (10ML) SYRINGE FOR IV PUSH (FOR BLOOD PRESSURE SUPPORT)
PREFILLED_SYRINGE | INTRAVENOUS | Status: AC
  Filled 2016-06-29: qty 10

## 2016-06-29 MED ORDER — METOCLOPRAMIDE HCL 5 MG PO TABS: 5.0000 mg | ORAL_TABLET | Freq: Three times a day (TID) | ORAL | Status: DC | PRN

## 2016-06-29 MED ORDER — BUPIVACAINE HCL (PF) 0.5 % IJ SOLN
INTRAMUSCULAR | Status: DC | PRN
Start: 2016-06-29 — End: 2016-06-29
  Administered 2016-06-29: 3 mL via INTRATHECAL

## 2016-06-29 MED ORDER — LIDOCAINE HCL (CARDIAC) 20 MG/ML IV SOLN
INTRAVENOUS | Status: DC | PRN
  Administered 2016-06-29: 50 mg via INTRAVENOUS

## 2016-06-29 MED ORDER — PROPOFOL 10 MG/ML IV BOLUS
INTRAVENOUS | Status: AC
  Filled 2016-06-29: qty 60

## 2016-06-29 MED ORDER — PHENYLEPHRINE HCL 10 MG/ML IJ SOLN
INTRAMUSCULAR | Status: DC | PRN
  Administered 2016-06-29: 120 ug via INTRAVENOUS
  Administered 2016-06-29 (×2): 80 ug via INTRAVENOUS
  Administered 2016-06-29: 120 ug via INTRAVENOUS
  Administered 2016-06-29 (×3): 80 ug via INTRAVENOUS

## 2016-06-29 MED ORDER — HYDROMORPHONE HCL 1 MG/ML IJ SOLN
0.2500 mg | INTRAMUSCULAR | Status: DC | PRN
  Administered 2016-06-29: 0.5 mg via INTRAVENOUS

## 2016-06-29 MED ORDER — MIDAZOLAM HCL 2 MG/2ML IJ SOLN
INTRAMUSCULAR | Status: AC
  Filled 2016-06-29: qty 2

## 2016-06-29 MED ORDER — ACETAMINOPHEN 10 MG/ML IV SOLN
1000.0000 mg | INTRAVENOUS | Status: AC
  Administered 2016-06-29: 1000 mg via INTRAVENOUS
  Filled 2016-06-29: qty 100

## 2016-06-29 MED ORDER — TRANEXAMIC ACID 1000 MG/10ML IV SOLN
1000.0000 mg | INTRAVENOUS | Status: AC
  Administered 2016-06-29: 1000 mg via INTRAVENOUS
  Filled 2016-06-29: qty 10

## 2016-06-29 MED ORDER — HYDROGEN PEROXIDE 3 % EX SOLN
CUTANEOUS | Status: DC | PRN
  Administered 2016-06-29: 1 via TOPICAL

## 2016-06-29 MED ORDER — SODIUM CHLORIDE 0.9 % IJ SOLN
INTRAMUSCULAR | Status: DC | PRN
  Administered 2016-06-29: 30 mL

## 2016-06-29 MED ORDER — CHLORHEXIDINE GLUCONATE 4 % EX LIQD: 60.0000 mL | Freq: Once | CUTANEOUS | Status: DC

## 2016-06-29 MED ORDER — PROMETHAZINE HCL 25 MG/ML IJ SOLN
6.2500 mg | INTRAMUSCULAR | Status: DC | PRN
Start: 2016-06-29 — End: 2016-06-29

## 2016-06-29 MED ORDER — METOCLOPRAMIDE HCL 5 MG/ML IJ SOLN
INTRAMUSCULAR | Status: DC | PRN
  Administered 2016-06-29: 10 mg via INTRAVENOUS

## 2016-06-29 MED ORDER — SODIUM CHLORIDE 0.9 % IR SOLN
Status: DC | PRN
  Administered 2016-06-29: 1000 mL

## 2016-06-29 MED ORDER — ALUM & MAG HYDROXIDE-SIMETH 200-200-20 MG/5ML PO SUSP: 30.0000 mL | ORAL | Status: DC | PRN

## 2016-06-29 MED ORDER — PROPOFOL 10 MG/ML IV BOLUS
INTRAVENOUS | Status: DC | PRN
  Administered 2016-06-29 (×2): 10 mg via INTRAVENOUS
  Administered 2016-06-29: 20 mg via INTRAVENOUS
  Administered 2016-06-29: 10 mg via INTRAVENOUS

## 2016-06-29 MED ORDER — HYDROMORPHONE HCL 1 MG/ML IJ SOLN
0.5000 mg | INTRAMUSCULAR | Status: DC | PRN
  Administered 2016-06-29 – 2016-06-30 (×4): 1 mg via INTRAVENOUS
  Filled 2016-06-29 (×4): qty 1

## 2016-06-29 MED ORDER — MIDAZOLAM HCL 5 MG/5ML IJ SOLN
INTRAMUSCULAR | Status: DC | PRN
  Administered 2016-06-29: 2 mg via INTRAVENOUS

## 2016-06-29 MED ORDER — PHENYLEPHRINE HCL 10 MG/ML IJ SOLN
INTRAMUSCULAR | Status: AC
  Filled 2016-06-29: qty 1

## 2016-06-29 MED ORDER — LIDOCAINE 2% (20 MG/ML) 5 ML SYRINGE
INTRAMUSCULAR | Status: AC
  Filled 2016-06-29: qty 5

## 2016-06-29 MED ORDER — SODIUM CHLORIDE 0.9 % IV SOLN: INTRAVENOUS | Status: DC

## 2016-06-29 MED ORDER — CEFAZOLIN SODIUM-DEXTROSE 2-4 GM/100ML-% IV SOLN
2.0000 g | INTRAVENOUS | Status: AC
  Administered 2016-06-29 (×2): 2 g via INTRAVENOUS
  Filled 2016-06-29: qty 100

## 2016-06-29 MED ORDER — 0.9 % SODIUM CHLORIDE (POUR BTL) OPTIME
TOPICAL | Status: DC | PRN
  Administered 2016-06-29: 1000 mL

## 2016-06-29 MED ORDER — HYDROMORPHONE HCL 1 MG/ML IJ SOLN
INTRAMUSCULAR | Status: AC
  Administered 2016-06-29: 0.5 mg via INTRAVENOUS
  Filled 2016-06-29: qty 1

## 2016-06-29 MED ORDER — BUPIVACAINE-EPINEPHRINE 0.25% -1:200000 IJ SOLN
INTRAMUSCULAR | Status: DC | PRN
  Administered 2016-06-29: 20 mL

## 2016-06-29 MED ORDER — HYDROCODONE-ACETAMINOPHEN 5-325 MG PO TABS
1.0000 | ORAL_TABLET | ORAL | Status: DC | PRN
  Administered 2016-06-29 – 2016-06-30 (×5): 2 via ORAL
  Filled 2016-06-29 (×5): qty 2

## 2016-06-29 MED ORDER — POVIDONE-IODINE 10 % EX SWAB: 2.0000 "application " | Freq: Once | CUTANEOUS | Status: DC

## 2016-06-29 MED ORDER — CEFAZOLIN SODIUM-DEXTROSE 2-4 GM/100ML-% IV SOLN
2.0000 g | Freq: Four times a day (QID) | INTRAVENOUS | Status: AC
  Administered 2016-06-29 – 2016-06-30 (×2): 2 g via INTRAVENOUS
  Filled 2016-06-29 (×2): qty 100

## 2016-06-29 MED ORDER — MEPERIDINE HCL 50 MG/ML IJ SOLN: 6.2500 mg | INTRAMUSCULAR | Status: DC | PRN

## 2016-06-29 MED ORDER — AMLODIPINE BESYLATE 10 MG PO TABS
10.0000 mg | ORAL_TABLET | Freq: Every day | ORAL | Status: DC
  Filled 2016-06-29: qty 1

## 2016-06-29 MED ORDER — ONDANSETRON HCL 4 MG/2ML IJ SOLN
INTRAMUSCULAR | Status: AC
  Filled 2016-06-29: qty 2

## 2016-06-29 MED ORDER — KETOROLAC TROMETHAMINE 30 MG/ML IJ SOLN
INTRAMUSCULAR | Status: AC
  Filled 2016-06-29: qty 1

## 2016-06-29 MED ORDER — ASPIRIN 81 MG PO CHEW
81.0000 mg | CHEWABLE_TABLET | Freq: Two times a day (BID) | ORAL | Status: DC
  Administered 2016-06-30: 81 mg via ORAL
  Filled 2016-06-29: qty 1

## 2016-06-29 MED ORDER — PANTOPRAZOLE SODIUM 40 MG PO TBEC
40.0000 mg | DELAYED_RELEASE_TABLET | Freq: Every day | ORAL | Status: DC
  Administered 2016-06-30: 40 mg via ORAL
  Filled 2016-06-29: qty 1

## 2016-06-29 MED ORDER — ONDANSETRON HCL 4 MG/2ML IJ SOLN: 4.0000 mg | Freq: Four times a day (QID) | INTRAMUSCULAR | Status: DC | PRN

## 2016-06-29 MED ORDER — METHOCARBAMOL 500 MG PO TABS
500.0000 mg | ORAL_TABLET | Freq: Four times a day (QID) | ORAL | Status: DC | PRN
Start: 2016-06-29 — End: 2016-06-30
  Administered 2016-06-29 – 2016-06-30 (×2): 500 mg via ORAL
  Filled 2016-06-29 (×2): qty 1

## 2016-06-29 MED ORDER — PROPOFOL 500 MG/50ML IV EMUL
INTRAVENOUS | Status: DC | PRN
  Administered 2016-06-29: 100 ug/kg/min via INTRAVENOUS

## 2016-06-29 MED ORDER — HYDROGEN PEROXIDE 3 % EX SOLN
CUTANEOUS | Status: AC
  Filled 2016-06-29: qty 473

## 2016-06-29 MED ORDER — METOCLOPRAMIDE HCL 5 MG/ML IJ SOLN: 5.0000 mg | Freq: Three times a day (TID) | INTRAMUSCULAR | Status: DC | PRN

## 2016-06-29 MED ORDER — DEXAMETHASONE SODIUM PHOSPHATE 10 MG/ML IJ SOLN
INTRAMUSCULAR | Status: DC | PRN
  Administered 2016-06-29: 10 mg via INTRAVENOUS

## 2016-06-29 MED ORDER — SODIUM CHLORIDE 0.9 % IJ SOLN
INTRAMUSCULAR | Status: AC
  Filled 2016-06-29: qty 50

## 2016-06-29 MED ORDER — ONDANSETRON HCL 4 MG/2ML IJ SOLN
INTRAMUSCULAR | Status: DC | PRN
  Administered 2016-06-29: 4 mg via INTRAVENOUS

## 2016-06-29 MED ORDER — ACETAMINOPHEN 10 MG/ML IV SOLN
INTRAVENOUS | Status: AC
  Filled 2016-06-29: qty 100

## 2016-06-29 MED ORDER — NEOMYCIN-POLYMYXIN-HC 3.5-10000-1 OT SOLN
3.0000 [drp] | Freq: Three times a day (TID) | OTIC | Status: DC
Start: 2016-06-29 — End: 2016-06-30
  Administered 2016-06-29 – 2016-06-30 (×2): 3 [drp] via OTIC
  Filled 2016-06-29: qty 10

## 2016-06-29 MED ORDER — ISOPROPYL ALCOHOL 70 % SOLN
Status: AC
  Filled 2016-06-29: qty 480

## 2016-06-29 SURGICAL SUPPLY — 60 items
BAG SPEC THK2 15X12 ZIP CLS (MISCELLANEOUS)
BAG ZIPLOCK 12X15 (MISCELLANEOUS) IMPLANT
BANDAGE ACE 4X5 VEL STRL LF (GAUZE/BANDAGES/DRESSINGS) ×3 IMPLANT
BANDAGE ACE 6X5 VEL STRL LF (GAUZE/BANDAGES/DRESSINGS) ×3 IMPLANT
BANDAGE ESMARK 6X9 LF (GAUZE/BANDAGES/DRESSINGS) ×1 IMPLANT
BLADE SAW RECIPROCATING 77.5 (BLADE) ×3 IMPLANT
BNDG CMPR 9X6 STRL LF SNTH (GAUZE/BANDAGES/DRESSINGS) ×1
BNDG ESMARK 6X9 LF (GAUZE/BANDAGES/DRESSINGS) ×3
CAPT KNEE TRIATH TK-4 ×2 IMPLANT
CHLORAPREP W/TINT 26ML (MISCELLANEOUS) ×6 IMPLANT
CUFF TOURN SGL QUICK 34 (TOURNIQUET CUFF) ×3
CUFF TRNQT CYL 34X4X40X1 (TOURNIQUET CUFF) ×1 IMPLANT
DECANTER SPIKE VIAL GLASS SM (MISCELLANEOUS) ×6 IMPLANT
DRAPE SHEET LG 3/4 BI-LAMINATE (DRAPES) ×6 IMPLANT
DRAPE U-SHAPE 47X51 STRL (DRAPES) ×3 IMPLANT
DRSG AQUACEL AG ADV 3.5X10 (GAUZE/BANDAGES/DRESSINGS) ×3 IMPLANT
DRSG TEGADERM 4X4.75 (GAUZE/BANDAGES/DRESSINGS) IMPLANT
ELECT REM PT RETURN 9FT ADLT (ELECTROSURGICAL) ×3
ELECTRODE REM PT RTRN 9FT ADLT (ELECTROSURGICAL) ×1 IMPLANT
EVACUATOR 1/8 PVC DRAIN (DRAIN) IMPLANT
GAUZE SPONGE 4X4 12PLY STRL (GAUZE/BANDAGES/DRESSINGS) ×3 IMPLANT
GLOVE BIO SURGEON STRL SZ8.5 (GLOVE) ×6 IMPLANT
GLOVE BIOGEL PI IND STRL 8.5 (GLOVE) ×1 IMPLANT
GLOVE BIOGEL PI INDICATOR 8.5 (GLOVE) ×2
GOWN SPEC L3 XXLG W/TWL (GOWN DISPOSABLE) ×3 IMPLANT
HANDPIECE INTERPULSE COAX TIP (DISPOSABLE) ×3
HOOD PEEL AWAY FLYTE STAYCOOL (MISCELLANEOUS) ×6 IMPLANT
LIQUID BAND (GAUZE/BANDAGES/DRESSINGS) ×4 IMPLANT
MARKER SKIN DUAL TIP RULER LAB (MISCELLANEOUS) ×6 IMPLANT
NAVIGATION CASE RENTAL (PORTABLE EQUIPMENT SUPPLIES) ×2 IMPLANT
NDL SPNL 18GX3.5 QUINCKE PK (NEEDLE) ×1 IMPLANT
NEEDLE SPNL 18GX3.5 QUINCKE PK (NEEDLE) ×3 IMPLANT
NS IRRIG 1000ML POUR BTL (IV SOLUTION) ×3 IMPLANT
PACK TOTAL KNEE CUSTOM (KITS) ×3 IMPLANT
PADDING CAST COTTON 6X4 STRL (CAST SUPPLIES) ×5 IMPLANT
POSITIONER SURGICAL ARM (MISCELLANEOUS) ×3 IMPLANT
SAW OSC TIP CART 19.5X105X1.3 (SAW) ×3 IMPLANT
SEALER BIPOLAR AQUA 6.0 (INSTRUMENTS) ×3 IMPLANT
SET HNDPC FAN SPRY TIP SCT (DISPOSABLE) ×1 IMPLANT
SET PAD KNEE POSITIONER (MISCELLANEOUS) ×3 IMPLANT
SOL PREP POV-IOD 4OZ 10% (MISCELLANEOUS) ×3 IMPLANT
SPONGE DRAIN TRACH 4X4 STRL 2S (GAUZE/BANDAGES/DRESSINGS) IMPLANT
SPONGE LAP 18X18 X RAY DECT (DISPOSABLE) IMPLANT
SUCTION FRAZIER HANDLE 12FR (TUBING) ×2
SUCTION TUBE FRAZIER 12FR DISP (TUBING) ×1 IMPLANT
SUT MNCRL AB 3-0 PS2 18 (SUTURE) ×3 IMPLANT
SUT MON AB 2-0 CT1 36 (SUTURE) ×6 IMPLANT
SUT STRATAFIX PDO 1 14 VIOLET (SUTURE) ×3
SUT STRATFX PDO 1 14 VIOLET (SUTURE) ×1
SUT VIC AB 1 CT1 36 (SUTURE) ×11 IMPLANT
SUT VIC AB 2-0 CT1 27 (SUTURE) ×3
SUT VIC AB 2-0 CT1 TAPERPNT 27 (SUTURE) ×1 IMPLANT
SUTURE STRATFX PDO 1 14 VIOLET (SUTURE) ×1 IMPLANT
SYR 50ML LL SCALE MARK (SYRINGE) ×3 IMPLANT
TOWER CARTRIDGE SMART MIX (DISPOSABLE) IMPLANT
TRAY FOLEY CATH 14FRSI W/METER (CATHETERS) ×2 IMPLANT
TRAY FOLEY W/METER SILVER 16FR (SET/KITS/TRAYS/PACK) IMPLANT
WATER STERILE IRR 1500ML POUR (IV SOLUTION) ×3 IMPLANT
WRAP KNEE MAXI GEL POST OP (GAUZE/BANDAGES/DRESSINGS) ×3 IMPLANT
YANKAUER SUCT BULB TIP 10FT TU (MISCELLANEOUS) ×3 IMPLANT

## 2016-06-29 NOTE — Op Note (Signed)
OPERATIVE REPORT  SURGEON: Rod Can, MD   ASSISTANT: Roberto Scales, RNFA.  PREOPERATIVE DIAGNOSIS: Left knee arthritis.   POSTOPERATIVE DIAGNOSIS: Left knee arthritis.   PROCEDURE: Left total knee arthroplasty.   IMPLANTS: Stryker Triathlon CR femur, size 6. Stryker Tritanium tibia, size 5. X3 polyethelyene insert, size 11 mm, CR. 3 button asymmetric patella, size 38 mm.  ANESTHESIA:  Spinal  TOURNIQUET TIME: Not utilized.   ESTIMATED BLOOD LOSS: 250 mL.  ANTIBIOTICS: 2 g Ancef.  DRAINS: None.  COMPLICATIONS: None   CONDITION: PACU - hemodynamically stable.   BRIEF CLINICAL NOTE: [name] is a 60 y.o. female with a long-standing history of Left knee arthritis. After failing conservative management, the patient was indicated for total knee arthroplasty. The risks, benefits, and alternatives to the procedure were explained, and the patient elected to proceed.  PROCEDURE IN DETAIL: Spinal anesthesia was obtained in the pre-op holding area. Once inside the operative room, a foley catheter was inserted. The patient was then positioned, a nonsterile tourniquet was placed, and the lower extremity was prepped and draped in the normal sterile surgical fashion. A time-out was called verifying side and site of surgery. The patient received IV antibiotics within 60 minutes of beginning the procedure. The tourniquet was not utilized.  An anterior approach to the knee was performed utilizing a medial parapatellar arthrotomy. A medial release was performed and the patellar fat pad was excised. Stryker navigation was used to cut the distal femur perpendicular to the mechanical axis. A freehand patellar resection was performed, and the patella was sized an prepared with 3 lug holes.  Nagivation was used to make a neutral proximal tibia resection, taking 8 mm of bone from the less  affected lateral side with 4 degrees of slope. The menisci were excised. A spacer block was placed, and the alignment and balance in extension were confirmed.   The distal femur was sized using the 3-degree external rotation guide referencing the posterior femoral cortex. The appropriate 4-in-1 cutting block was pinned into place. Rotation was checked using Whiteside's line, the epicondylar axis, and then confirmed with a spacer block in flexion. The remaining femoral cuts were performed, taking care to protect the MCL.  The tibia was sized and the trial tray was pinned into place. The remaining trail components were inserted. The knee was stable to varus and valgus stress through a full range of motion. The patella tracked centrally, and the PCL was well balanced. The trial components were removed, and the proximal tibial surface was prepared. Final components were impacted into place. The knee was tested for a final time and found to be well balanced.  The wound was copiously irrigated with a dilute betadine solution followed by normal saline with pulse lavage. Marcaine solution was injected into the periarticular soft tissue. The wound was closed in layers using #1 Vicryl and V-Loc for the fascia, 2-0 Vicryl for the subcutaneous fat, 2-0 Monocryl for the deep dermal layer, 3-0 running Monocryl subcuticular Stitch, and Dermabond for the skin. Once the glue was fully dried, an Aquacell Ag and compressive dressing were applied. Tthe patient was transported to the recovery room in stable condition. Sponge, needle, and instrument counts were correct at the end of the case x2. The patient tolerated the procedure well and there were no known complications.

## 2016-06-29 NOTE — Interval H&P Note (Signed)
History and Physical Interval Note:  06/29/2016 12:42 PM  [name]  has presented today for surgery, with the diagnosis of DEGENERATIVE JOINT LEFT KNEE  The various methods of treatment have been discussed with the patient and family. After consideration of risks, benefits and other options for treatment, the patient has consented to  Procedure(s): LEFT TOTAL KNEE WITH NAVIGATION (Left) as a surgical intervention .  The patient's history has been reviewed, patient examined, no change in status, stable for surgery.  I have reviewed the patient's chart and labs.  Questions were answered to the patient's satisfaction.     Staley Lunz, Horald Pollen

## 2016-06-29 NOTE — Discharge Instructions (Signed)
° °Dr. Jameica Couts °Total Joint Specialist °[facility] °[address] °[place], [place] [address] °[phone] ° °TOTAL KNEE REPLACEMENT POSTOPERATIVE DIRECTIONS ° ° ° °Knee Rehabilitation, Guidelines Following Surgery  °Results after knee surgery are often greatly improved when you follow the exercise, range of motion and muscle strengthening exercises prescribed by your doctor. Safety measures are also important to protect the knee from further injury. Any time any of these exercises cause you to have increased pain or swelling in your knee joint, decrease the amount until you are comfortable again and slowly increase them. If you have problems or questions, call your caregiver or physical therapist for advice.  ° °WEIGHT BEARING °Weight bearing as tolerated with assist device (walker, cane, etc) as directed, use it as long as suggested by your surgeon or therapist, typically at least 4-6 weeks. ° °HOME CARE INSTRUCTIONS  °Remove items at home which could result in a fall. This includes throw rugs or furniture in walking pathways.  °Continue medications as instructed at time of discharge. °You may have some home medications which will be placed on hold until you complete the course of blood thinner medication.  °You may start showering once you are discharged home but do not submerge the incision under water. Just pat the incision dry and apply a dry gauze dressing on daily. °Walk with walker as instructed.  °You may resume a sexual relationship in one month or when given the OK by your doctor.  °· Use walker as long as suggested by your caregivers. °· Avoid periods of inactivity such as sitting longer than an hour when not asleep. This helps prevent blood clots.  °You may put full weight on your legs and walk as much as is comfortable.  °You may return to work once you are cleared by your doctor.  °Do not drive a car for 6 weeks or until released by you surgeon.  °· Do not drive  while taking narcotics.  °Wear the elastic stockings for three weeks following surgery during the day but you may remove then at night. °Make sure you keep all of your appointments after your operation with all of your doctors and caregivers. You should call the office at the above phone number and make an appointment for approximately two weeks after the date of your surgery. °Do not remove your surgical dressing. The dressing is waterproof; you may take showers in 3 days, but do not take tub baths or submerge the dressing. °Please pick up a stool softener and laxative for home use as long as you are requiring pain medications. °· ICE to the affected knee every three hours for 30 minutes at a time and then as needed for pain and swelling.  Continue to use ice on the knee for pain and swelling from surgery. You may notice swelling that will progress down to the foot and ankle.  This is normal after surgery.  Elevate the leg when you are not up walking on it.   °It is important for you to complete the blood thinner medication as prescribed by your doctor. °· Continue to use the breathing machine which will help keep your temperature down.  It is common for your temperature to cycle up and down following surgery, especially at night when you are not up moving around and exerting yourself.  The breathing machine keeps your lungs expanded and your temperature down. ° °RANGE OF MOTION AND STRENGTHENING EXERCISES  °Rehabilitation of the knee is important following   a knee injury or an operation. After just a few days of immobilization, the muscles of the thigh which control the knee become weakened and shrink (atrophy). Knee exercises are designed to build up the tone and strength of the thigh muscles and to improve knee motion. Often times heat used for twenty to thirty minutes before working out will loosen up your tissues and help with improving the range of motion but do not use heat for the first two weeks following  surgery. These exercises can be done on a training (exercise) mat, on the floor, on a table or on a bed. Use what ever works the best and is most comfortable for you Knee exercises include:  °Leg Lifts - While your knee is still immobilized in a splint or cast, you can do straight leg raises. Lift the leg to 60 degrees, hold for 3 sec, and slowly lower the leg. Repeat 10-20 times 2-3 times daily. Perform this exercise against resistance later as your knee gets better.  °Quad and Hamstring Sets - Tighten up the muscle on the front of the thigh (Quad) and hold for 5-10 sec. Repeat this 10-20 times hourly. Hamstring sets are done by pushing the foot backward against an object and holding for 5-10 sec. Repeat as with quad sets.  °A rehabilitation program following serious knee injuries can speed recovery and prevent re-injury in the future due to weakened muscles. Contact your doctor or a physical therapist for more information on knee rehabilitation.  ° °SKILLED REHAB INSTRUCTIONS: °If the patient is transferred to a skilled rehab facility following release from the hospital, a list of the current medications will be sent to the facility for the patient to continue.  When discharged from the skilled rehab facility, please have the facility set up the patient's Home Health Physical Therapy prior to being released. Also, the skilled facility will be responsible for providing the patient with their medications at time of release from the facility to include their pain medication, the muscle relaxants, and their blood thinner medication. If the patient is still at the rehab facility at time of the two week follow up appointment, the skilled rehab facility will also need to assist the patient in arranging follow up appointment in our office and any transportation needs. ° °MAKE SURE YOU:  °Understand these instructions.  °Will watch your condition.  °Will get help right away if you are not doing well or get worse.   ° ° °Pick up stool softner and laxative for home use following surgery while on pain medications. °Do NOT remove your dressing. You may shower.  °Do not take tub baths or submerge incision under water. °May shower starting three days after surgery. °Please use a clean towel to pat the incision dry following showers. °Continue to use ice for pain and swelling after surgery. °Do not use any lotions or creams on the incision until instructed by your surgeon. ° °

## 2016-06-29 NOTE — Transfer of Care (Signed)
Immediate Anesthesia Transfer of Care Note  Patient: [name]  Procedure(s) Performed: Procedure(s): LEFT TOTAL KNEE WITH NAVIGATION (Left)  Patient Location: PACU  Anesthesia Type:MAC and Spinal  Level of Consciousness: Patient easily awoken, sedated, comfortable, cooperative, following commands, responds to stimulation.   Airway & Oxygen Therapy: Patient spontaneously breathing, ventilating well, oxygen via simple oxygen mask.  Post-op Assessment: Report given to PACU RN, vital signs reviewed and stable, moving all extremities.   Post vital signs: Reviewed and stable.  Complications: No apparent anesthesia complications  Last Vitals:  Vitals:   06/29/16 1039  BP: (!) 125/95  Pulse: 73  Resp: 16  Temp: 37.2 C    Last Pain:  Vitals:   06/29/16 1129  TempSrc:   PainSc: 8          Complications: No apparent anesthesia complications

## 2016-06-29 NOTE — Anesthesia Postprocedure Evaluation (Signed)
Anesthesia Post Note  Patient: [name]  Procedure(s) Performed: Procedure(s) (LRB): LEFT TOTAL KNEE WITH NAVIGATION (Left)  Patient location during evaluation: PACU Anesthesia Type: Spinal and MAC Level of consciousness: awake and alert Pain management: pain level controlled Vital Signs Assessment: post-procedure vital signs reviewed and stable Respiratory status: spontaneous breathing and respiratory function stable Cardiovascular status: blood pressure returned to baseline and stable Postop Assessment: spinal receding Anesthetic complications: no    Last Vitals:  Vitals:   06/29/16 1750 06/29/16 1805  BP: 97/66 98/63  Pulse:  66  Resp:  16  Temp:  36.5 C    Last Pain:  Vitals:   06/29/16 1745  TempSrc:   PainSc: 2                  Nolon Nations

## 2016-06-29 NOTE — Anesthesia Preprocedure Evaluation (Addendum)
Anesthesia Evaluation    Airway Mallampati: II  TM Distance: >3 FB Neck ROM: Full    Dental no notable dental hx.    Pulmonary sleep apnea , pneumonia, former smoker,    Pulmonary exam normal breath sounds clear to auscultation       Cardiovascular hypertension, + Peripheral Vascular Disease  Normal cardiovascular exam Rhythm:Regular Rate:Normal     Neuro/Psych    GI/Hepatic GERD  ,  Endo/Other  diabetes  Renal/GU      Musculoskeletal  (+) Arthritis ,   Abdominal   Peds  Hematology   Anesthesia Other Findings   Reproductive/Obstetrics                             Anesthesia Physical Anesthesia Plan  ASA: II  Anesthesia Plan: Spinal   Post-op Pain Management:    Induction:   Airway Management Planned:   Additional Equipment:   Intra-op Plan:   Post-operative Plan:   Informed Consent: I have reviewed the patients History and Physical, chart, labs and discussed the procedure including the risks, benefits and alternatives for the proposed anesthesia with the patient or authorized representative who has indicated his/her understanding and acceptance.   Dental advisory given  Plan Discussed with: CRNA  Anesthesia Plan Comments:        Anesthesia Quick Evaluation

## 2016-06-29 NOTE — Anesthesia Procedure Notes (Signed)
Spinal  Patient location during procedure: OR Staffing Anesthesiologist: Nolon Nations Performed: anesthesiologist  Preanesthetic Checklist Completed: patient identified, site marked, surgical consent, pre-op evaluation, timeout performed, IV checked, risks and benefits discussed and monitors and equipment checked Spinal Block Patient position: sitting Prep: Betadine Patient monitoring: heart rate, continuous pulse ox and blood pressure Approach: right paramedian Location: L3-4 Injection technique: single-shot Needle Needle type: Sprotte  Needle gauge: 24 G Needle length: 9 cm Additional Notes Expiration date of kit checked and confirmed. Patient tolerated procedure well, without complications.

## 2016-06-29 NOTE — H&P (View-Only) (Signed)
TOTAL KNEE ADMISSION H&P  Patient is being admitted for left total knee arthroplasty.  Subjective:  Chief Complaint:left knee pain.  HPI: [name], 60 y.o. female, has a history of pain and functional disability in the left knee due to arthritis and has failed non-surgical conservative treatments for greater than 12 weeks to includeNSAID's and/or analgesics, viscosupplementation injections, flexibility and strengthening excercises, use of assistive devices, weight reduction as appropriate and activity modification.  Onset of symptoms was gradual, starting 3 years ago with gradually worsening course since that time. The patient noted no past surgery on the left knee(s).  Patient currently rates pain in the left knee(s) at 10 out of 10 with activity. Patient has night pain, worsening of pain with activity and weight bearing, pain that interferes with activities of daily living, pain with passive range of motion and joint swelling.  Patient has evidence of subchondral cysts, subchondral sclerosis, periarticular osteophytes and joint space narrowing by imaging studies. There is no active infection.  Patient Active Problem List   Diagnosis Date Noted  . Sinusitis 11/25/2015  . Diabetes mellitus type 2, uncomplicated ([place]) [id number]  . GERD (gastroesophageal reflux disease) 04/16/2015  . Constipation 04/16/2015  . Dyspepsia 04/16/2015  . Chronic venous insufficiency 02/10/2015  . Otitis, externa, infective 02/10/2015  . Osteoarthritis 02/10/2015  . Obesity 02/10/2015  . OSA (obstructive sleep apnea) 09/05/2012  . Essential hypertension, benign 12/31/2011   Past Medical History:  Diagnosis Date  . Breast cancer ([place]) 2011  . Diabetes mellitus, type II ([place])   . Hypertension   . Osteoarthritis of both knees     Past Surgical History:  Procedure Laterality Date  . ABDOMINAL HYSTERECTOMY    . BLADDER SUSPENSION    . MASTECTOMY, PARTIAL    . MENISCUS REPAIR    . OOPHORECTOMY        (Not in a hospital admission) No Known Allergies  Social History  Substance Use Topics  . Smoking status: Former Smoker    Packs/day: 0.50    Years: 20.00    Types: Cigarettes    Quit date: 10/14/2013  . Smokeless tobacco: Never Used  . Alcohol use No    Family History  Problem Relation Age of Onset  . Adopted: Yes     Review of Systems  Constitutional: Negative.   HENT: Negative.   Eyes: Positive for blurred vision.  Respiratory: Negative.   Cardiovascular: Negative.   Gastrointestinal: Negative.   Genitourinary: Negative.   Musculoskeletal: Positive for joint pain.  Skin: Negative.   Neurological: Negative.   Endo/Heme/Allergies: Negative.   Psychiatric/Behavioral: Negative.     Objective:  Physical Exam  Vitals reviewed. Constitutional: She is oriented to person, place, and time. She appears well-developed and well-nourished.  HENT:  Head: Normocephalic and atraumatic.  Eyes: Conjunctivae and EOM are normal. Pupils are equal, round, and reactive to light.  Neck: Normal range of motion. Neck supple.  Cardiovascular: Normal rate, regular rhythm and intact distal pulses.   Respiratory: Effort normal. No respiratory distress.  GI: Soft. Bowel sounds are normal. She exhibits no distension.  Genitourinary:  Genitourinary Comments: deferred  Musculoskeletal:       Left knee: She exhibits decreased range of motion, effusion and deformity. Tenderness found. Medial joint line and lateral joint line tenderness noted.  ROM 0-110  Neurological: She is alert and oriented to person, place, and time. She has normal reflexes.  Skin: Skin is warm and dry.  Psychiatric: She has a normal mood and affect. Her  behavior is normal. Judgment and thought content normal.    Vital signs in last 24 hours: @VSRANGES @  Labs:   Estimated body mass index is 34.38 kg/m as calculated from the following:   Height as of 04/28/16: 5\' 6"  (1.676 m).   Weight as of 04/28/16: 96.6 kg (213  lb).   Imaging Review Plain radiographs demonstrate severe degenerative joint disease of the bilaterally knee(s). The overall alignment issignificant varus. The bone quality appears to be adequate for age and reported activity level.  Assessment/Plan:  End stage arthritis, left knee   The patient history, physical examination, clinical judgment of the provider and imaging studies are consistent with end stage degenerative joint disease of the left knee(s) and total knee arthroplasty is deemed medically necessary. The treatment options including medical management, injection therapy arthroscopy and arthroplasty were discussed at length. The risks and benefits of total knee arthroplasty were presented and reviewed. The risks due to aseptic loosening, infection, stiffness, patella tracking problems, thromboembolic complications and other imponderables were discussed. The patient acknowledged the explanation, agreed to proceed with the plan and consent was signed. Patient is being admitted for inpatient treatment for surgery, pain control, PT, OT, prophylactic antibiotics, VTE prophylaxis, progressive ambulation and ADL's and discharge planning. The patient is planning to be discharged home with home health services

## 2016-06-30 ENCOUNTER — Encounter (HOSPITAL_COMMUNITY): Payer: Self-pay | Admitting: Orthopedic Surgery

## 2016-06-30 LAB — CBC
HCT: 33.1 % — ABNORMAL LOW (ref 36.0–46.0)
Hemoglobin: 10.7 g/dL — ABNORMAL LOW (ref 12.0–15.0)
MCH: 27.7 pg (ref 26.0–34.0)
MCHC: 32.3 g/dL (ref 30.0–36.0)
MCV: 85.8 fL (ref 78.0–100.0)
PLATELETS: 374 10*3/uL (ref 150–400)
RBC: 3.86 MIL/uL — AB (ref 3.87–5.11)
RDW: 13.5 % (ref 11.5–15.5)
WBC: 11.8 10*3/uL — AB (ref 4.0–10.5)

## 2016-06-30 LAB — BASIC METABOLIC PANEL
ANION GAP: 7 (ref 5–15)
BUN: 17 mg/dL (ref 6–20)
CO2: 24 mmol/L (ref 22–32)
Calcium: 9.2 mg/dL (ref 8.9–10.3)
Chloride: 99 mmol/L — ABNORMAL LOW (ref 101–111)
Creatinine, Ser: 0.85 mg/dL (ref 0.44–1.00)
Glucose, Bld: 166 mg/dL — ABNORMAL HIGH (ref 65–99)
POTASSIUM: 4.9 mmol/L (ref 3.5–5.1)
SODIUM: 130 mmol/L — AB (ref 135–145)

## 2016-06-30 LAB — GLUCOSE, CAPILLARY
GLUCOSE-CAPILLARY: 147 mg/dL — AB (ref 65–99)
GLUCOSE-CAPILLARY: 211 mg/dL — AB (ref 65–99)

## 2016-06-30 MED ORDER — ONDANSETRON HCL 4 MG PO TABS: 4.0000 mg | ORAL_TABLET | Freq: Four times a day (QID) | ORAL | 0 refills | Status: DC | PRN

## 2016-06-30 MED ORDER — HYDROCODONE-ACETAMINOPHEN 5-325 MG PO TABS: 1.0000 | ORAL_TABLET | ORAL | 0 refills | Status: DC | PRN

## 2016-06-30 MED ORDER — DOCUSATE SODIUM 100 MG PO CAPS: 100.0000 mg | ORAL_CAPSULE | Freq: Two times a day (BID) | ORAL | 3 refills | Status: DC

## 2016-06-30 MED ORDER — METHOCARBAMOL 500 MG PO TABS: 500.0000 mg | ORAL_TABLET | Freq: Four times a day (QID) | ORAL | 0 refills | Status: DC | PRN

## 2016-06-30 MED ORDER — SENNA 8.6 MG PO TABS: 2.0000 | ORAL_TABLET | Freq: Every day | ORAL | 3 refills | Status: DC

## 2016-06-30 MED ORDER — ASPIRIN 81 MG PO CHEW: 81.0000 mg | CHEWABLE_TABLET | Freq: Two times a day (BID) | ORAL | 1 refills | Status: DC

## 2016-06-30 NOTE — Care Management Note (Signed)
Case Management Note  Patient Details  Name: Rylynn Minniear MRN: [id number] Date of Birth: April 07, 1956  Subjective/Objective:  [facility] rep [place] tel#[phone] able to accept-faxed H&P,op note, HHPT order, & d/c w/confirmation to fax#[phone]. Has all dme fro [facility] in rm. No further CM needs.                  Action/Plan:d/c home w/HHC   Expected Discharge Date:                 Expected Discharge Plan:  [place]  In-House Referral:     Discharge planning Services  CM Consult  Post Acute Care Choice:    Choice offered to:  Patient  DME Arranged:  3-N-1, Walker rolling DME Agency:     HH Arranged:  PT HH Agency:  [facility]  Status of Service:  Completed, signed off  If discussed at [place] of Stay Meetings, dates discussed:    Additional Comments:  Dessa Phi, RN 06/30/2016, 2:49 PM

## 2016-06-30 NOTE — Discharge Summary (Signed)
Physician Discharge Summary  Patient ID: [name] MRN: [id number] DOB/AGE: 01/08/1956 60 y.o.  Admit date: 06/29/2016 Discharge date: 06/30/2016  Admission Diagnoses:  Primary osteoarthritis of left knee  Discharge Diagnoses:  Principal Problem:   Primary osteoarthritis of left knee   Past Medical History:  Diagnosis Date  . Breast cancer [facility]) 2011   right breast with axillary lymph node removal  . Diabetes mellitus, type II ([place])   . GERD (gastroesophageal reflux disease)   . Hypertension   . Osteoarthritis of both knees   . Pneumonia    in early 1980's    Surgeries: Procedure(s): LEFT TOTAL KNEE WITH NAVIGATION on 06/29/2016   Consultants (if any):   Discharged Condition: Improved  Hospital Course: [name] is an 60 y.o. female who was admitted 06/29/2016 with a diagnosis of Primary osteoarthritis of left knee and went to the operating room on 06/29/2016 and underwent the above named procedures.    She was given perioperative antibiotics:  Anti-infectives    Start     Dose/Rate Route Frequency Ordered Stop   06/29/16 2000  ceFAZolin (ANCEF) IVPB 2g/100 mL premix     2 g 200 mL/hr over 30 Minutes Intravenous Every 6 hours 06/29/16 1815 06/30/16 0237   06/29/16 1013  ceFAZolin (ANCEF) IVPB 2g/100 mL premix     2 g 200 mL/hr over 30 Minutes Intravenous On call to O.R. 06/29/16 1013 06/29/16 1320    .  She was given sequential compression devices, early ambulation, and ASA for DVT prophylaxis.  She benefited maximally from the hospital stay and there were no complications.    Recent vital signs:  Vitals:   06/30/16 1050 06/30/16 1423  BP:  (!) 111/57  Pulse:  84  Resp:  16  Temp: 97.4 F (36.3 C) 98.4 F (36.9 C)    Recent laboratory studies:  Lab Results  Component Value Date   HGB 10.7 (L) 06/30/2016   HGB 13.8 06/20/2016   HGB 12.8 04/28/2016   Lab Results  Component Value Date   WBC 11.8 (H) 06/30/2016   PLT 374 06/30/2016   No  results found for: INR Lab Results  Component Value Date   NA 130 (L) 06/30/2016   K 4.9 06/30/2016   CL 99 (L) 06/30/2016   CO2 24 06/30/2016   BUN 17 06/30/2016   CREATININE 0.85 06/30/2016   GLUCOSE 166 (H) 06/30/2016    Discharge Medications:     Medication List    STOP taking these medications   acetaminophen 650 MG CR tablet Commonly known as:  TYLENOL   traMADol 50 MG tablet Commonly known as:  ULTRAM     TAKE these medications   amLODipine 10 MG tablet Commonly known as:  NORVASC TAKE 1 TABLET BY MOUTH EVERY DAY   aspirin 81 MG chewable tablet Chew 1 tablet (81 mg total) by mouth 2 (two) times daily.   diclofenac 75 MG EC tablet Commonly known as:  VOLTAREN Take 1 tablet (75 mg total) by mouth daily.   docusate sodium 100 MG capsule Commonly known as:  COLACE Take 1 capsule (100 mg total) by mouth 2 (two) times daily.   furosemide 20 MG tablet Commonly known as:  LASIX TAKE 1 TABLET BY MOUTH AS NEEDED. TAKE WITH 20 MEQ POTASSIUM   HYDROcodone-acetaminophen 5-325 MG tablet Commonly known as:  NORCO/VICODIN Take 1-2 tablets by mouth every 4 (four) hours as needed (breakthrough pain).   MAGNESIA PO Take 1 tablet by mouth daily.  metFORMIN 500 MG tablet Commonly known as:  GLUCOPHAGE TAKE 1 TABLET BY MOUTH TWICE DAILY WITH FOOD   methocarbamol 500 MG tablet Commonly known as:  ROBAXIN Take 1 tablet (500 mg total) by mouth every 6 (six) hours as needed for muscle spasms.   neomycin-polymyxin-hydrocortisone otic solution Commonly known as:  CORTISPORIN Place 3 drops into both ears 3 (three) times daily.   ondansetron 4 MG tablet Commonly known as:  ZOFRAN Take 1 tablet (4 mg total) by mouth every 6 (six) hours as needed for nausea.   pantoprazole 40 MG tablet Commonly known as:  PROTONIX Take 1 tablet (40 mg total) by mouth daily.   potassium chloride SA 20 MEQ tablet Commonly known as:  K-DUR,KLOR-CON TAKE 1 TABLET BY MOUTH EVERY DAY AS  NEEDED   senna 8.6 MG Tabs tablet Commonly known as:  SENOKOT Take 2 tablets (17.2 mg total) by mouth at bedtime.   spironolactone 25 MG tablet Commonly known as:  ALDACTONE TAKE 1 TABLET BY MOUTH EVERY DAY       Diagnostic Studies: Dg Knee Left Port  Result Date: 06/29/2016 CLINICAL DATA:  Status post left total knee arthroplasty. EXAM: PORTABLE LEFT KNEE - 1-2 VIEW COMPARISON:  None. FINDINGS: The femoral, tibial and patellar components are well seated. No complicating features are demonstrated. IMPRESSION: Well seated components of a total left knee arthroplasty. Electronically Signed   By: Marijo Sanes M.D.   On: 06/29/2016 17:24    Disposition: 01-Home or Self Care  Discharge Instructions    Call MD / Call 911    Complete by:  As directed    If you experience chest pain or shortness of breath, CALL 911 and be transported to the hospital emergency room.  If you develope a fever above 101 F, pus (white drainage) or increased drainage or redness at the wound, or calf pain, call your surgeon's office.   Constipation Prevention    Complete by:  As directed    Drink plenty of fluids.  Prune juice may be helpful.  You may use a stool softener, such as Colace (over the counter) 100 mg twice a day.  Use MiraLax (over the counter) for constipation as needed.   Diet - low sodium heart healthy    Complete by:  As directed    Driving restrictions    Complete by:  As directed    No driving for 6 weeks   Increase activity slowly as tolerated    Complete by:  As directed    Lifting restrictions    Complete by:  As directed    No lifting for 6 weeks   TED hose    Complete by:  As directed    Use stockings (TED hose) for 2 weeks on both leg(s).  You may remove them at night for sleeping.      Follow-up Information    Abiha Lukehart, Horald Pollen, MD. Schedule an appointment as soon as possible for a visit in 2 week(s).   Specialty:  Orthopedic Surgery Why:  For wound re-check Contact  information: [place]. [address] [address] [phone]        [place] .   Specialty:  [place] Why:  [place] [facility] Contact information: [address] [place] [phone]        Inc[facility] - [facility] .   Why:  rw,3n1 Contact information: [address] [place] [address] [phone]  Signed: Elie Goody 07/01/2016, 7:52 AM

## 2016-06-30 NOTE — Progress Notes (Signed)
   Subjective:  Patient reports pain as mild to moderate.  No c/o.   Objective:   VITALS:   Vitals:   06/29/16 2039 06/29/16 2250 06/30/16 0150 06/30/16 0516  BP: 115/70 108/71 113/67 113/85  Pulse: 81 75 67 82  Resp: 18 20 17 16   Temp: 97.8 F (36.6 C) 98 F (36.7 C) 97.7 F (36.5 C) 97.5 F (36.4 C)  TempSrc: Oral Axillary Oral Oral  SpO2: 97% 100% 99% 99%  Weight:      Height:        ABD soft Sensation intact distally Intact pulses distally Dorsiflexion/Plantar flexion intact Incision: dressing C/D/I Compartment soft   Lab Results  Component Value Date   WBC 11.8 (H) 06/30/2016   HGB 10.7 (L) 06/30/2016   HCT 33.1 (L) 06/30/2016   MCV 85.8 06/30/2016   PLT 374 06/30/2016   BMET    Component Value Date/Time   NA 130 (L) 06/30/2016 0450   K 4.9 06/30/2016 0450   CL 99 (L) 06/30/2016 0450   CO2 24 06/30/2016 0450   GLUCOSE 166 (H) 06/30/2016 0450   BUN 17 06/30/2016 0450   CREATININE 0.85 06/30/2016 0450   CALCIUM 9.2 06/30/2016 0450   GFRNONAA >60 06/30/2016 0450   GFRAA >60 06/30/2016 0450     Assessment/Plan: 1 Day Post-Op   Principal Problem:   Primary osteoarthritis of left knee  WBAT with walker ASA, SCDs, TEDs PT/OT PO pain control  D/ C home with HHPT   Charleton Deyoung, Horald Pollen 06/30/2016, 7:06 AM   Rod Can, MD Cell [phone]

## 2016-06-30 NOTE — Progress Notes (Signed)
Physical Therapy Treatment Patient Details Name: Nakota Abadie MRN: [id number] DOB: 11/21/1955 Today's Date: 06/30/2016    History of Present Illness s/p L TKA    PT Comments    Pt progressing well with mobility and eager for dc home.  Reviewed therex, car transfers and stairs with written stairs instructions provided.  Follow Up Recommendations  Home health PT     Equipment Recommendations  Rolling walker with 5" wheels    Recommendations for Other Services OT consult     Precautions / Restrictions Precautions Precautions: Knee Restrictions Weight Bearing Restrictions: No Other Position/Activity Restrictions: WBAT    Mobility  Bed Mobility               General bed mobility comments: NT - Pt OOB and declines to return to bed  Transfers Overall transfer level: Needs assistance Equipment used: Rolling walker (2 wheeled) Transfers: Sit to/from Stand Sit to Stand: Supervision         General transfer comment: for safety; cues for UE/LE placement  Ambulation/Gait Ambulation/Gait assistance: Min guard;Supervision Ambulation Distance (Feet): 200 Feet Assistive device: Rolling walker (2 wheeled) Gait Pattern/deviations: Step-to pattern;Step-through pattern;Decreased step length - right;Decreased step length - left;Shuffle;Trunk flexed Gait velocity: decr Gait velocity interpretation: Below normal speed for age/gender General Gait Details: cues for posture, position from RW and initial sequence   Stairs Stairs: Yes Stairs assistance: Min assist Stair Management: One rail Left;Step to pattern;Forwards;With cane Number of Stairs: 8 General stair comments: cues for sequence and foot/cane placement  Wheelchair Mobility    Modified Rankin (Stroke Patients Only)       Balance                                    Cognition Arousal/Alertness: Awake/alert Behavior During Therapy: WFL for tasks assessed/performed Overall Cognitive Status:  Within Functional Limits for tasks assessed                      Exercises Total Joint Exercises Ankle Circles/Pumps: AROM;Both;15 reps;Supine Quad Sets: AROM;Both;10 reps;Supine    General Comments        Pertinent Vitals/Pain Pain Assessment: 0-10 Pain Score: 4  Pain Location: L knee Pain Descriptors / Indicators: Aching;Sore Pain Intervention(s): Limited activity within patient's tolerance;Monitored during session;Premedicated before session;Ice applied    Home Living                      Prior Function            PT Goals (current goals can now be found in the care plan section) Acute Rehab PT Goals Patient Stated Goal: return to independence; pt states R knee also needs sx PT Goal Formulation: With patient Time For Goal Achievement: 06/02/16 Potential to Achieve Goals: Good Progress towards PT goals: Progressing toward goals    Frequency    7X/week      PT Plan Current plan remains appropriate    Co-evaluation             End of Session Equipment Utilized During Treatment: Gait belt Activity Tolerance: Patient tolerated treatment well Patient left: in chair;with call bell/phone within reach     Time: 1315-1350 PT Time Calculation (min) (ACUTE ONLY): 35 min  Charges:  $Gait Training: 8-22 mins $Therapeutic Activity: 8-22 mins  G Codes:      Carmon Sahli 07-22-16, 3:31 PM

## 2016-06-30 NOTE — Evaluation (Signed)
Physical Therapy Evaluation Patient Details Name: [name] MRN: [id number] DOB: 09/08/1956 Today's Date: 06/30/2016   History of Present Illness  s/p L TKA  Clinical Impression  Pt s/p L TKR presents with decreased L LE strength/ROM and post op pain limiting functional mobility.  Pt should progress to dc home with family assist and HHPT follow up.    Follow Up Recommendations Home health PT    Equipment Recommendations  Rolling walker with 5" wheels    Recommendations for Other Services OT consult     Precautions / Restrictions Precautions Precautions: Knee Restrictions Weight Bearing Restrictions: No Other Position/Activity Restrictions: WBAT      Mobility  Bed Mobility Overal bed mobility: Needs Assistance Bed Mobility: Supine to Sit     Supine to sit: Supervision     General bed mobility comments: min cues for sequence and use o fR LE to self assist  Transfers Overall transfer level: Needs assistance Equipment used: Rolling walker (2 wheeled) Transfers: Sit to/from Stand Sit to Stand: Min guard         General transfer comment: for safety; cues for UE/LE placement  Ambulation/Gait Ambulation/Gait assistance: Min assist;Min guard Ambulation Distance (Feet): 100 Feet Assistive device: Rolling walker (2 wheeled) Gait Pattern/deviations: Step-to pattern;Step-through pattern;Decreased step length - right;Decreased step length - left;Shuffle;Trunk flexed Gait velocity: decr Gait velocity interpretation: Below normal speed for age/gender General Gait Details: cues for posture, position from RW and initial sequence  Stairs            Wheelchair Mobility    Modified Rankin (Stroke Patients Only)       Balance                                             Pertinent Vitals/Pain Pain Assessment: 0-10 Pain Score: 4  Pain Location: L knee Pain Descriptors / Indicators: Aching;Sore Pain Intervention(s): Limited activity  within patient's tolerance;Premedicated before session;Monitored during session;Ice applied    Home Living Family/patient expects to be discharged to:: Private residence Living Arrangements: Spouse/significant other Available Help at Discharge: Family Type of Home: House Home Access: Stairs to enter Entrance Stairs-Rails: Right Entrance Stairs-Number of Steps: 7 Home Layout: One level Home Equipment: Shower seat - built in      Prior Function Level of Independence: Independent         Comments: pt works in [place] at [employer]        Extremity/Trunk Assessment   Upper Extremity Assessment: Overall WFL for tasks assessed           Lower Extremity Assessment: LLE deficits/detail   LLE Deficits / Details: 3/5 quads with IND SLR; AAROM at knee -5 - 60  Cervical / Trunk Assessment: Normal  Communication   Communication: No difficulties  Cognition Arousal/Alertness: Awake/alert Behavior During Therapy: WFL for tasks assessed/performed Overall Cognitive Status: Within Functional Limits for tasks assessed                      General Comments      Exercises Total Joint Exercises Ankle Circles/Pumps: AROM;Both;15 reps;Supine Quad Sets: AROM;Both;10 reps;Supine Heel Slides: AAROM;Left;15 reps;Supine Straight Leg Raises: AROM;Left;10 reps;Supine   Assessment/Plan    PT Assessment Patient needs continued PT services  PT Problem List Decreased strength;Decreased range of motion;Decreased activity tolerance;Decreased mobility;Pain;Decreased knowledge of use of DME  PT Treatment Interventions DME instruction;Gait training;Stair training;Functional mobility training;Therapeutic activities;Therapeutic exercise;Patient/family education    PT Goals (Current goals can be found in the Care Plan section)  Acute Rehab PT Goals Patient Stated Goal: return to independence; pt states R knee also needs sx PT Goal Formulation: With  patient Time For Goal Achievement: 06/02/16 Potential to Achieve Goals: Good    Frequency 7X/week   Barriers to discharge        Co-evaluation               End of Session Equipment Utilized During Treatment: Gait belt Activity Tolerance: Patient tolerated treatment well Patient left: in chair;with call bell/phone within reach;with family/visitor present Nurse Communication: Mobility status         Time: [id number] PT Time Calculation (min) (ACUTE ONLY): 44 min   Charges:   PT Evaluation $PT Eval Low Complexity: 1 Procedure PT Treatments $Gait Training: 8-22 mins $Therapeutic Exercise: 8-22 mins   PT G Codes:        Kamie Korber 2016-07-05, 12:03 PM

## 2016-06-30 NOTE — Progress Notes (Signed)
Physical Therapy Treatment Patient Details Name: [name] MRN: [id number] DOB: Jul 31, 1956 Today's Date: 06/30/2016    History of Present Illness s/p L TKA    PT Comments    Pt performed therex with home therex program reviewed and provided in written form.  Follow Up Recommendations  Home health PT     Equipment Recommendations  Rolling walker with 5" wheels    Recommendations for Other Services OT consult     Precautions / Restrictions Precautions Precautions: Knee Restrictions Weight Bearing Restrictions: No Other Position/Activity Restrictions: WBAT    Mobility  Bed Mobility               General bed mobility comments: NT - Pt OOB and declines to return to bed  Transfers Overall transfer level: Needs assistance Equipment used: Rolling walker (2 wheeled) Transfers: Sit to/from Stand Sit to Stand: Supervision         General transfer comment: for safety; cues for UE/LE placement  Ambulation/Gait Ambulation/Gait assistance: Min guard;Supervision Ambulation Distance (Feet): 200 Feet Assistive device: Rolling walker (2 wheeled) Gait Pattern/deviations: Step-to pattern;Step-through pattern;Decreased step length - right;Decreased step length - left;Shuffle;Trunk flexed Gait velocity: decr Gait velocity interpretation: Below normal speed for age/gender General Gait Details: cues for posture, position from RW and initial sequence   Stairs Stairs: Yes Stairs assistance: Min assist Stair Management: One rail Left;Step to pattern;Forwards;With cane Number of Stairs: 8 General stair comments: cues for sequence and foot/cane placement  Wheelchair Mobility    Modified Rankin (Stroke Patients Only)       Balance                                    Cognition Arousal/Alertness: Awake/alert Behavior During Therapy: WFL for tasks assessed/performed Overall Cognitive Status: Within Functional Limits for tasks assessed                      Exercises Total Joint Exercises Ankle Circles/Pumps: AROM;Both;15 reps;Supine Quad Sets: AROM;Both;10 reps;Supine Heel Slides: AAROM;Left;15 reps;Supine Straight Leg Raises: AROM;Left;10 reps;Supine Long Arc Quad: AROM;Left;10 reps;Seated    General Comments        Pertinent Vitals/Pain Pain Assessment: 0-10 Pain Score: 4  Pain Location: L knee Pain Descriptors / Indicators: Aching;Sore Pain Intervention(s): Limited activity within patient's tolerance;Monitored during session;Premedicated before session;Ice applied    Home Living                      Prior Function            PT Goals (current goals can now be found in the care plan section) Acute Rehab PT Goals Patient Stated Goal: return to independence; pt states R knee also needs sx PT Goal Formulation: With patient Time For Goal Achievement: 06/02/16 Potential to Achieve Goals: Good Progress towards PT goals: Progressing toward goals    Frequency    7X/week      PT Plan Current plan remains appropriate    Co-evaluation             End of Session Equipment Utilized During Treatment: Gait belt Activity Tolerance: Patient tolerated treatment well Patient left: in chair;with call bell/phone within reach     Time: [id number] PT Time Calculation (min) (ACUTE ONLY): 18 min  Charges:  $Gait Training: 8-22 mins $Therapeutic Exercise: 8-22 mins $Therapeutic Activity: 8-22 mins  G Codes:      [name] 07-Jul-2016, 4:52 PM

## 2016-06-30 NOTE — Evaluation (Signed)
Occupational Therapy Evaluation Patient Details Name: [name] MRN: [id number] DOB: 01/05/1956 Today's Date: 06/30/2016    History of Present Illness s/p L TKA   Clinical Impression   This 60 year old female was admitted for the above sx. All education was completed. No further OT is needed at this time   Follow Up Recommendations  No OT follow up;Supervision/Assistance - 24 hour    Equipment Recommendations  3 in 1 bedside comode    Recommendations for Other Services       Precautions / Restrictions Precautions Precautions: Knee Restrictions Weight Bearing Restrictions: No      Mobility Bed Mobility               General bed mobility comments: oob  Transfers Overall transfer level: Needs assistance Equipment used: Rolling walker (2 wheeled) Transfers: Sit to/from Stand Sit to Stand: Min guard         General transfer comment: for safety; cues for UE/LE placement    Balance                                            ADL Overall ADL's : Needs assistance/impaired     Grooming: Oral care;Supervision/safety;Standing   Upper Body Bathing: Set up;Sitting   Lower Body Bathing: Minimal assistance;Sit to/from stand   Upper Body Dressing : Set up;Sitting       Toilet Transfer: Min guard;BSC;RW;Ambulation   Toileting- Clothing Manipulation and Hygiene: Min guard;Sit to/from stand   Tub/ Shower Transfer: Walk-in shower;Min guard;Ambulation     General ADL Comments: ambulated to bathroom and completed ADL, toilet transfer and shower transfer.  Educated on knee precautions and sidestepping through tight spaces     Vision     Perception     Praxis      Pertinent Vitals/Pain Pain Assessment: 0-10 Pain Score: 6  Pain Location: L knee Pain Descriptors / Indicators: Aching Pain Intervention(s): Limited activity within patient's tolerance;Monitored during session;Premedicated before session;Repositioned;Ice applied      Hand Dominance     Extremity/Trunk Assessment Upper Extremity Assessment Upper Extremity Assessment: Overall WFL for tasks assessed           Communication Communication Communication: No difficulties   Cognition Arousal/Alertness: Awake/alert Behavior During Therapy: WFL for tasks assessed/performed Overall Cognitive Status: Within Functional Limits for tasks assessed                     General Comments       Exercises       Shoulder Instructions      Home Living Family/patient expects to be discharged to:: Private residence Living Arrangements: Spouse/significant other Available Help at Discharge: Family               Bathroom Shower/Tub: Occupational psychologist: Standard     Home Equipment: Civil engineer, contracting - built in          Prior Functioning/Environment Level of Independence: Independent        Comments: pt works in Government social research officer records at [employer]        OT Problem List:     OT Treatment/Interventions:      OT Goals(Current goals can be found in the care plan section) Acute Rehab OT Goals Patient Stated Goal: return to independence; pt states R knee also needs sx OT Goal Formulation: All assessment and  education complete, DC therapy  OT Frequency:     Barriers to D/C:            Co-evaluation              End of Session Nurse Communication:  (meds for itching)  Activity Tolerance: Patient tolerated treatment well Patient left: in chair;with call bell/phone within reach;with family/visitor present   Time: [id number] OT Time Calculation (min): 27 min Charges:  OT General Charges $OT Visit: 1 Procedure OT Evaluation $OT Eval Low Complexity: 1 Procedure OT Treatments $Self Care/Home Management : 8-22 mins G-Codes:    Evelin Cake 2016/07/02, 10:28 AM Lesle Chris, OTR/L [phone] 07/02/2016

## 2016-06-30 NOTE — Care Management Note (Signed)
Case Management Note  Patient Details  Name: [name] MRN: [id number] Date of Birth: Aug 04, 1956  Subjective/Objective:    PT/OT-no f/u, needs rw,3n1.[facility] dme rep Jermaine aware of dme needed to deliver to rm.home rw,3n1-will deliver to rm prior d/c. Nsg aware.No further CM needs.                Action/Plan:d/c home w/DME   Expected Discharge Date:                 Expected Discharge Plan:  Home/Self Care  In-House Referral:     Discharge planning Services     Post Acute Care Choice:    Choice offered to:  Patient  DME Arranged:  3-N-1, Walker rolling DME Agency:     HH Arranged:    HH Agency:     Status of Service:  Completed, signed off  If discussed at [employer], dates discussed:    Additional Comments:  Dessa Phi, RN 06/30/2016, 11:33 AM

## 2016-06-30 NOTE — Care Management Note (Signed)
Case Management Note  Patient Details  Name: [name] MRN: [id number] Date of Birth: 1956/07/13  Subjective/Objective:  PT-recc HHPT. TC [facility], Difficulty trying to find a HHC agency-will continue to try to find a [place] agency.                  Action/Plan:d/c home w/HHC   Expected Discharge Date:                Expected Discharge Plan:  [place]  In-House Referral:     Discharge planning Services  CM Consult  Post Acute Care Choice:    Choice offered to:  Patient  DME Arranged:  3-N-1, Walker rolling DME Agency:     HH Arranged:    HH Agency:     Status of Service:  In process, will continue to follow  If discussed at Long Length of Stay Meetings, dates discussed:    Additional Comments:  Dessa Phi, RN 06/30/2016, 2:15 PM

## 2016-07-03 LAB — TYPE AND SCREEN
ABO/RH(D): O POS
Antibody Screen: POSITIVE
DAT, IgG: NEGATIVE
DONOR AG TYPE: NEGATIVE
Donor AG Type: NEGATIVE
UNIT DIVISION: 0
UNIT DIVISION: 0

## 2016-07-19 ENCOUNTER — Encounter (HOSPITAL_COMMUNITY): Payer: Managed Care, Other (non HMO) | Admitting: Physical Therapy

## 2016-07-21 ENCOUNTER — Encounter (HOSPITAL_COMMUNITY): Payer: Managed Care, Other (non HMO) | Admitting: Physical Therapy

## 2016-07-24 ENCOUNTER — Ambulatory Visit (HOSPITAL_COMMUNITY): Payer: Managed Care, Other (non HMO) | Admitting: Physical Therapy

## 2016-07-24 ENCOUNTER — Ambulatory Visit (HOSPITAL_COMMUNITY): Payer: Managed Care, Other (non HMO) | Attending: Orthopedic Surgery | Admitting: Physical Therapy

## 2016-07-24 DIAGNOSIS — M25562 Pain in left knee: Secondary | ICD-10-CM | POA: Diagnosis not present

## 2016-07-24 DIAGNOSIS — M25662 Stiffness of left knee, not elsewhere classified: Secondary | ICD-10-CM | POA: Diagnosis present

## 2016-07-24 DIAGNOSIS — R601 Generalized edema: Secondary | ICD-10-CM

## 2016-07-24 DIAGNOSIS — R262 Difficulty in walking, not elsewhere classified: Secondary | ICD-10-CM | POA: Insufficient documentation

## 2016-07-24 NOTE — Therapy (Signed)
[name], [place], [address] Phone: [phone]   Fax:  [phone]  Physical Therapy Evaluation  Patient Details  Name: [name] MRN: [id number] Date of Birth: 12-14-1958 Referring Provider: Rod Can   Encounter Date: 07/24/2016      PT End of Session - 07/24/16 1352    Visit Number 1   Number of Visits 18   Date for PT Re-Evaluation 08/23/16   Authorization Type [employer]   Authorization - Visit Number 1   Authorization - Number of Visits 10   PT Start Time [id number]   PT Stop Time 1118   PT Time Calculation (min) 43 min   Activity Tolerance Patient tolerated treatment well   Behavior During Therapy [name] for tasks assessed/performed      Past Medical History:  Diagnosis Date  . Breast cancer [facility]) 2011   right breast with axillary lymph node removal  . Diabetes mellitus, type II ([place])   . GERD (gastroesophageal reflux disease)   . Hypertension   . Osteoarthritis of both knees   . Pneumonia    in early 1960's    Past Surgical History:  Procedure Laterality Date  . ABDOMINAL HYSTERECTOMY    . APPENDECTOMY  1978  . BLADDER SUSPENSION    . KNEE ARTHROPLASTY Left 06/29/2016   Procedure: LEFT TOTAL KNEE WITH NAVIGATION;  Surgeon: Rod Can, MD;  Location: [facility];  Service: Orthopedics;  Laterality: Left;  Marland Kitchen MASTECTOMY, PARTIAL     right breast  . MENISCUS REPAIR     right knee  . OOPHORECTOMY      There were no vitals filed for this visit.       Subjective Assessment - 07/24/16 1040    Subjective Ms. [name] states that she had a Lt TKR on 06/29/2016 and was discharged on 06/30/2016.  She recieved [place] until 07/20/2016.  She is having the most difficulty with sleeping   She is still waking up every hour and is not able to sleep in her bed    How long can you sit comfortably? Pt is able to sit for 30 minutes   How long can you stand comfortably? Able to stand for 45 minutes    How long  can you walk comfortably? Pt is currently walking with a quad cane for up to ten to 15 minutes    Patient Stated Goals Sleep better, get back into her bed, walk without quad cane, complete steps in a reciprocal manner and decrease pain    Currently in Pain? Yes   Pain Score 6    Pain Location Leg   Pain Orientation Left   Pain Descriptors / Indicators Aching   Pain Type Surgical pain   Pain Onset 1 to 4 weeks ago   Pain Frequency Constant   Aggravating Factors  bending    Pain Relieving Factors icing, medication             [facility] PT Assessment - 07/24/16 0001      Assessment   Medical Diagnosis Lt TKR   Referring Provider Aaron Edelman [facility]    Onset Date/Surgical Date 06/29/16   Next MD Visit 08/16/2016   Prior Therapy acute/ HH     Precautions   Precautions None     Restrictions   Weight Bearing Restrictions No     Balance Screen   Has the patient fallen in the past 6 months No   Has the patient had a decrease in  activity level because of a fear of falling?  Yes   Is the patient reluctant to leave their home because of a fear of falling?  No     Home Environment   Living Environment Private residence   Type of [place] to enter   Entrance Stairs-Number of Steps Short One level     Prior Function   Level of Independence Independent   Vocation Full time employment   Vocation Requirements Manage release of information    Leisure garden, get down on the floor and clean baseboard and cupboards      Cognition   Overall Cognitive Status Within Functional Limits for tasks assessed     Observation/Other Assessments   Focus on Therapeutic Outcomes (FOTO)  39     Observation/Other Assessments-Edema    Edema Circumferential  LT : knee jt 48.8 RT:43.3     Functional Tests   Functional tests Single leg stance;Sit to Stand     Single Leg Stance   Comments RT 38; LT 19     Sit to Stand   Comments 5  times 15 seconds.     ROM /  Strength   AROM / PROM / Strength AROM;Strength     AROM   AROM Assessment Site Hip;Knee;Ankle   Right/Left Knee Left   Left Knee Extension 7   Left Knee Flexion 110     Strength   Strength Assessment Site Hip;Knee;Ankle   Right/Left Hip Right;Left   Right Hip Flexion 4+/5   Right Hip Extension 5/5   Left Hip Flexion 4-/5   Left Hip Extension 4/5   Left Hip ABduction 4/5   Right/Left Knee Right;Left   Right Knee Flexion 5/5   Right Knee Extension 5/5   Left Knee Flexion 4+/5   Left Knee Extension 5/5   Right/Left Ankle Right;Left   Right Ankle Dorsiflexion 5/5   Left Ankle Dorsiflexion 5/5                           PT Education - 07/24/16 1352    Education provided Yes   Education Details HEP   Person(s) Educated Patient   Methods Explanation;Demonstration;Handout   Comprehension Returned demonstration;Verbalized understanding          PT Short Term Goals - 07/24/16 1357      PT SHORT TERM GOAL #1   Title Pt pain in her left knee to be no greater than a 6/10 to allow pt to sleep for three hours at at time.    Time 3   Period Weeks   Status New     PT SHORT TERM GOAL #2   Title Pt to be ambulating without an assistive device   Time 3   Period Weeks   Status New     PT SHORT TERM GOAL #3   Title Pt Lt knee  extension ROM to be less than four degrees in her left knee to allow normalized gait    Time 3   Period Weeks   Status New     PT SHORT TERM GOAL #4   Title Pt Lt knee flexion ROM to be to 115 to allow pt to sit for an hour without discomfort to eat a meal at a restaurant.    Time 3   Period Weeks     PT SHORT TERM GOAL #5   Title Pt to be able  to walk for 20 mintues without increased pain in her left knee    Time 3   Period Weeks           PT Long Term Goals - 07/24/16 1400      PT LONG TERM GOAL #1   Title Pt pain to be on greater than a 2/10 to allow pt to sleep for up to 6 hours    Time 6   Period Weeks   Status  New     PT LONG TERM GOAL #2   Title Pt to be sleeping in her bed again.    Time 6   Period Weeks   Status New     PT LONG TERM GOAL #3   Title Pt strength in her Lt LE to be at least a 4+/5 in all musculature to allow pt to go up and down 10 steps in a reciprocal manner without difficulty.    Time 6   Period Weeks     PT LONG TERM GOAL #4   Title Pt to be able to ambulate for an hour without increased pain in her Lt knee to allow pt to shop in comfort    Time 6   Period Weeks     PT LONG TERM GOAL #5   Title Pt Lt knee ROM  edema to be decreased by 3 cm to allow improved flextion to  120 degrees to allow pt to sit for 2 hours in comfort for traveling.    Time 6   Period Weeks   Status New               Plan - 07/24/16 1353    Clinical Impression Statement Ms. Noreen is a 60 yo female who had a LT TKR completed on 06/29/2016.  She recieved HH and now is being referred to skilled out-patient therapy to maximize her functional ability.  She is anticipating getting her Rt knee replaced in January.  Examination demonstrates increased edema, increased pain, difficulty walking, decreased ROM and decreased strength.  Ms. Borstad will benefit from skilled physical therapy to address these issues and maximize her functioning ability.    Rehab Potential Good   PT Frequency 3x / week   PT Duration 6 weeks   PT Treatment/Interventions ADLs/Self Care Home Management;Electrical Stimulation;Cryotherapy;Balance training;Therapeutic exercise;Therapeutic activities;Stair training;Gait training;Patient/family education;Manual techniques;Manual lymph drainage;Passive range of motion   PT Next Visit Plan Continue with manual to decrease edema; focus on ROM then functional strengthening and heel toe gait.     Consulted and Agree with Plan of Care Patient      Patient will benefit from skilled therapeutic intervention in order to improve the following deficits and impairments:  Abnormal gait,  Decreased activity tolerance, Decreased balance, Decreased range of motion, Difficulty walking, Increased edema, Decreased strength, Pain  Visit Diagnosis: Acute pain of left knee - Plan: PT plan of care cert/re-cert  Generalized edema - Plan: PT plan of care cert/re-cert  Stiffness of left knee, not elsewhere classified - Plan: PT plan of care cert/re-cert  Difficulty in walking, not elsewhere classified - Plan: PT plan of care cert/re-cert     Problem List Patient Active Problem List   Diagnosis Date Noted  . Primary osteoarthritis of left knee 06/29/2016  . Sinusitis 11/25/2015  . Diabetes mellitus type 2, uncomplicated ([place]) [id number]  . GERD (gastroesophageal reflux disease) 04/16/2015  . Constipation 04/16/2015  . Dyspepsia 04/16/2015  . Chronic venous insufficiency 02/10/2015  . Otitis, externa,  infective 02/10/2015  . Osteoarthritis 02/10/2015  . Obesity 02/10/2015  . OSA (obstructive sleep apnea) 09/05/2012  . Essential hypertension, benign 12/31/2011   Rayetta Humphrey, PT CLT [phone] 07/24/2016, 2:08 PM  [place], [place], [address] Phone: [phone]   Fax:  [phone]  Name: Demariah Padro MRN: [id number] Date of Birth: 08/05/1956

## 2016-07-24 NOTE — Patient Instructions (Addendum)
Strengthening: Quadriceps Set    Tighten muscles on top of thighs by pushing knees down into surface. Hold 3-5____ seconds.1 Repeat ___10_ times per set. Do __1__ sets per session. Do _2___ sessions per day.  http://orth.exer.us/602   Copyright  VHI. All rights reserved.  Self-Mobilization: Heel Slide (Supine)    Slide left heel toward buttocks until a gentle stretch is felt. Hold __3-5__ seconds. Relax. Repeat ___10_ times per set. Do __1__ sets per session. Do __2__ sessions per day.  http://orth.exer.us/710   Copyright  VHI. All rights reserved.  Strengthening: Hip Abduction (Side-Lying)    Tighten muscles on front of left thigh, then lift leg __15__ inches from surface, keeping knee locked.  Repeat __10__ times per set. Do __1__ sets per session. Do ___2_ sessions per day.  http://orth.exer.us/622   Copyright  VHI. All rights reserved.  Self-Mobilization: Knee Flexion (Prone)    Bring left heel toward buttocks as close as possible. Hold ___2_ seconds. Relax. Repeat 10____ times per set. Do __1__ sets per session. Do _2___ sessions per day.  http://orth.exer.us/596   Copyright  VHI. All rights reserved.  Strengthening: Hip Extension (Prone)    Tighten muscles on front of left thigh, then lift leg __3__ inches from surface, keeping knee locked. Repeat _10___ times per set. Do __1__ sets per session. Do 2____ sessions per day.  http://orth.exer.us/620   Copyright  VHI. All rights reserved.

## 2016-07-26 ENCOUNTER — Ambulatory Visit (HOSPITAL_COMMUNITY): Payer: Managed Care, Other (non HMO) | Admitting: Physical Therapy

## 2016-07-26 DIAGNOSIS — M25562 Pain in left knee: Secondary | ICD-10-CM

## 2016-07-26 DIAGNOSIS — R262 Difficulty in walking, not elsewhere classified: Secondary | ICD-10-CM

## 2016-07-26 DIAGNOSIS — M25662 Stiffness of left knee, not elsewhere classified: Secondary | ICD-10-CM

## 2016-07-26 DIAGNOSIS — R601 Generalized edema: Secondary | ICD-10-CM

## 2016-07-26 NOTE — Therapy (Signed)
[place], [place], [address] Phone: [phone]   Fax:  [phone]  Physical Therapy Treatment  Patient Details  Name: [name] MRN: [id number] Date of Birth: 1955/12/24 Referring Provider: Rod Can   Encounter Date: 07/26/2016      PT End of Session - 07/26/16 1200    Visit Number 2   Number of Visits 18   Date for PT Re-Evaluation 08/23/16   Authorization Type [employer]   Authorization - Visit Number 2   Authorization - Number of Visits 10   PT Start Time [id number]   PT Stop Time 1202   PT Time Calculation (min) 39 min   Activity Tolerance Patient tolerated treatment well   Behavior During Therapy [facility] for tasks assessed/performed      Past Medical History:  Diagnosis Date  . Breast cancer [facility]) 2011   right breast with axillary lymph node removal  . Diabetes mellitus, type II ([place])   . GERD (gastroesophageal reflux disease)   . Hypertension   . Osteoarthritis of both knees   . Pneumonia    in early 1980's    Past Surgical History:  Procedure Laterality Date  . ABDOMINAL HYSTERECTOMY    . APPENDECTOMY  1978  . BLADDER SUSPENSION    . KNEE ARTHROPLASTY Left 06/29/2016   Procedure: LEFT TOTAL KNEE WITH NAVIGATION;  Surgeon: Rod Can, MD;  Location: [facility];  Service: Orthopedics;  Laterality: Left;  Marland Kitchen MASTECTOMY, PARTIAL     right breast  . MENISCUS REPAIR     right knee  . OOPHORECTOMY      There were no vitals filed for this visit.      Subjective Assessment - 07/26/16 1130    Subjective Pt states that she has rode the bike this morning and has done her exercises.  Pt comes to the department walking without an assistive device    Currently in Pain? Yes   Pain Score 3    Pain Location Knee   Pain Orientation Left   Pain Descriptors / Indicators Aching;Throbbing   Pain Type Surgical pain   Pain Onset 1 to 4 weeks ago                         [facility] Adult PT  Treatment/Exercise - 07/26/16 0001      Ambulation/Gait   Ambulation Distance (Feet) 100 Feet   Gait Comments worked on heel toe gait pattern      Exercises   Exercises Knee/Hip     Knee/Hip Exercises: Stretches   Active Hamstring Stretch Left;3 reps;30 seconds   Active Hamstring Stretch Limitations supine     Knee/Hip Exercises: Standing   Heel Raises Both;10 reps   Functional Squat 10 reps   SLS Lt : 2x max 50"      Knee/Hip Exercises: Supine   Quad Sets Both;10 reps   Heel Slides Left;10 reps   Terminal Knee Extension Left;Both;10 reps   Other Supine Knee/Hip Exercises AROM 6-112   Other Supine Knee/Hip Exercises dorsiflexion/plantar flexion x 10      Manual Therapy   Manual Therapy Joint mobilization;Manual Lymphatic Drainage (MLD)   Manual therapy comments done seperate from all other aspects of treatment    Joint Mobilization patellar mobs    Manual Lymphatic Drainage (MLD) to decrease surgical edema in Lt LE                 PT Education -  07/26/16 1159    Education provided Yes   Education Details heel toe gt   Person(s) Educated Patient   Methods Explanation   Comprehension Verbalized understanding;Returned demonstration          PT Short Term Goals - 07/26/16 1212      PT SHORT TERM GOAL #1   Title Pt pain in her left knee to be no greater than a 6/10 to allow pt to sleep for three hours at at time.    Time 3   Period Weeks   Status On-going     PT SHORT TERM GOAL #2   Title Pt to be ambulating without an assistive device   Time 3   Period Weeks   Status Achieved     PT SHORT TERM GOAL #3   Title Pt Lt knee  extension ROM to be less than four degrees in her left knee to allow normalized gait    Time 3   Period Weeks   Status On-going     PT SHORT TERM GOAL #4   Title Pt Lt knee flexion ROM to be to 115 to allow pt to sit for an hour without discomfort to eat a meal at a restaurant.    Time 3   Period Weeks   Status On-going     PT  SHORT TERM GOAL #5   Title Pt to be able to walk for 20 mintues without increased pain in her left knee    Time 3   Period Weeks   Status On-going           PT Long Term Goals - 07/26/16 1213      PT LONG TERM GOAL #1   Title Pt pain to be on greater than a 2/10 to allow pt to sleep for up to 6 hours    Time 6   Period Weeks   Status On-going     PT LONG TERM GOAL #2   Title Pt to be sleeping in her bed again.    Time 6   Period Weeks   Status On-going     PT LONG TERM GOAL #3   Title Pt strength in her Lt LE to be at least a 4+/5 in all musculature to allow pt to go up and down 10 steps in a reciprocal manner without difficulty.    Time 6   Period Weeks   Status On-going     PT LONG TERM GOAL #4   Title Pt to be able to ambulate for an hour without increased pain in her Lt knee to allow pt to shop in comfort    Time 6   Period Weeks   Status On-going     PT LONG TERM GOAL #5   Title Pt Lt knee ROM  edema to be decreased by 3 cm to allow improved flextion to  120 degrees to allow pt to sit for 2 hours in comfort for traveling.    Time 6   Period Weeks   Status On-going               Plan - 07/26/16 1201    Clinical Impression Statement Pt to department ambulating without an assistive device. addressed heel toe gait.   Pt main limitation remains edema.  Decongestive techniques congestive techniqes used to address edema issues.  Pt started on functional strengthening exercises with minimal cuing needed for proper technique.     Rehab Potential Good   PT Frequency  3x / week   PT Duration 6 weeks   PT Treatment/Interventions ADLs/Self Care Home Management;Electrical Stimulation;Cryotherapy;Balance training;Therapeutic exercise;Therapeutic activities;Stair training;Gait training;Patient/family education;Manual techniques;Manual lymph drainage;Passive range of motion   PT Next Visit Plan Continue to address heel toe gait  with manual to decrease edema; focus on ROM  then functional strengthening; measure edema at knee joint to see if it is decreasing at all.     Consulted and Agree with Plan of Care Patient      Patient will benefit from skilled therapeutic intervention in order to improve the following deficits and impairments:  Abnormal gait, Decreased activity tolerance, Decreased balance, Decreased range of motion, Difficulty walking, Increased edema, Decreased strength, Pain  Visit Diagnosis: Acute pain of left knee  Generalized edema  Stiffness of left knee, not elsewhere classified  Difficulty in walking, not elsewhere classified     Problem List Patient Active Problem List   Diagnosis Date Noted  . Primary osteoarthritis of left knee 06/29/2016  . Sinusitis 11/25/2015  . Diabetes mellitus type 2, uncomplicated ([place]) [id number]  . GERD (gastroesophageal reflux disease) 04/16/2015  . Constipation 04/16/2015  . Dyspepsia 04/16/2015  . Chronic venous insufficiency 02/10/2015  . Otitis, externa, infective 02/10/2015  . Osteoarthritis 02/10/2015  . Obesity 02/10/2015  . OSA (obstructive sleep apnea) 09/05/2012  . Essential hypertension, benign 12/31/2011  Rayetta Humphrey, PT CLT [phone]  07/26/2016, 12:14 PM  [place] [address] [place], [place], [address] Phone: [phone]   Fax:  [phone]  Name: [name] MRN: [id number] Date of Birth: October 01, 1956

## 2016-07-28 ENCOUNTER — Ambulatory Visit (HOSPITAL_COMMUNITY): Payer: Managed Care, Other (non HMO)

## 2016-07-28 DIAGNOSIS — M25562 Pain in left knee: Secondary | ICD-10-CM | POA: Diagnosis not present

## 2016-07-28 DIAGNOSIS — R601 Generalized edema: Secondary | ICD-10-CM

## 2016-07-28 DIAGNOSIS — M25662 Stiffness of left knee, not elsewhere classified: Secondary | ICD-10-CM

## 2016-07-28 DIAGNOSIS — R262 Difficulty in walking, not elsewhere classified: Secondary | ICD-10-CM

## 2016-07-28 NOTE — Therapy (Signed)
[place], [place], [address] Phone: [phone]   Fax:  [phone]  Physical Therapy Treatment  Patient Details  Name: [name] MRN: [id number] Date of Birth: Jun 17, 1956 Referring Provider: Rod Can  Encounter Date: 07/28/2016      PT End of Session - 07/28/16 1100    Visit Number 3   Number of Visits 18   Date for PT Re-Evaluation 08/23/16   Authorization Type [employer]ign[employer]   Authorization - Visit Number 3   Authorization - Number of Visits 10   PT Start Time [id number]   PT Stop Time 1117   PT Time Calculation (min) 41 min   Activity Tolerance Patient tolerated treatment well   Behavior During Therapy [facility] for tasks assessed/performed      Past Medical History:  Diagnosis Date  . Breast cancer [facility]) 2011   right breast with axillary lymph node removal  . Diabetes mellitus, type II ([place])   . GERD (gastroesophageal reflux disease)   . Hypertension   . Osteoarthritis of both knees   . Pneumonia    in early 1980's    Past Surgical History:  Procedure Laterality Date  . ABDOMINAL HYSTERECTOMY    . APPENDECTOMY  1978  . BLADDER SUSPENSION    . KNEE ARTHROPLASTY Left 06/29/2016   Procedure: LEFT TOTAL KNEE WITH NAVIGATION;  Surgeon: Rod Can, MD;  Location: [facility];  Service: Orthopedics;  Laterality: Left;  Marland Kitchen MASTECTOMY, PARTIAL     right breast  . MENISCUS REPAIR     right knee  . OOPHORECTOMY      There were no vitals filed for this visit.      Subjective Assessment - 07/28/16 1036    Subjective Pt stated pain scale 2/10, has taken pain meds prior session today.  Pt entered dept walking with no AD.  Reports inability to sleep in her bed, has been sleeping on recliner due to discomfort from the flat mattress.   Patient Stated Goals Sleep better, get back into her bed, walk without quad cane, complete steps in a reciprocal manner and decrease pain    Currently in Pain? Yes   Pain Score 2     Pain Location Knee   Pain Orientation Left   Pain Descriptors / Indicators Aching   Pain Onset 1 to 4 weeks ago   Pain Frequency Constant   Aggravating Factors  bending   Pain Relieving Factors icing and medication   Effect of Pain on Daily Activities limited            [facility] PT Assessment - 07/28/16 0001      Assessment   Medical Diagnosis Lt TKR   Referring Provider Aaron Edelman Swinteck   Onset Date/Surgical Date 06/29/16   Next MD Visit 08/16/2016   Prior Therapy acute/ HH     Observation/Other Assessments-Edema    Edema Circumferential  Lt knee joint 46cm     AROM   Left Knee Extension 4   Left Knee Flexion 120            [facility] Adult PT Treatment/Exercise - 07/28/16 0001      Ambulation/Gait   Ambulation Distance (Feet) 552 Feet   Gait Comments worked on heel toe gait pattern      Knee/Hip Exercises: Stretches   Active Hamstring Stretch Left;3 reps;30 seconds   Active Hamstring Stretch Limitations supine   Gastroc Stretch 3 reps;30 seconds   Gastroc Stretch Limitations slant board  Knee/Hip Exercises: Standing   Heel Raises Both;10 reps   Heel Raises Limitations toe raises   Functional Squat 10 reps   SLS Lt 35", Rt 26' max of 2     Knee/Hip Exercises: Seated   Sit to Sand 10 reps;without UE support     Knee/Hip Exercises: Supine   Quad Sets 10 reps   Quad Sets Limitations with heel slides   Heel Slides Left;10 reps   Terminal Knee Extension Left;Both;10 reps   Other Supine Knee/Hip Exercises AROM 4-120     Manual Therapy   Manual Therapy Edema management;Joint mobilization   Manual therapy comments done seperate from all other aspects of treatment    Joint Mobilization patellar mobs    Manual Lymphatic Drainage (MLD) to decrease surgical edema in Lt LE                 PT Education - 07/28/16 1119    Education provided Yes   Education Details given [employer] for [employer]) Educated Patient   Methods  Explanation;Handout   Comprehension Verbalized understanding          PT Short Term Goals - 07/26/16 1212      PT SHORT TERM GOAL #1   Title Pt pain in her left knee to be no greater than a 6/10 to allow pt to sleep for three hours at at time.    Time 3   Period Weeks   Status On-going     PT SHORT TERM GOAL #2   Title Pt to be ambulating without an assistive device   Time 3   Period Weeks   Status Achieved     PT SHORT TERM GOAL #3   Title Pt Lt knee  extension ROM to be less than four degrees in her left knee to allow normalized gait    Time 3   Period Weeks   Status On-going     PT SHORT TERM GOAL #4   Title Pt Lt knee flexion ROM to be to 115 to allow pt to sit for an hour without discomfort to eat a meal at a restaurant.    Time 3   Period Weeks   Status On-going     PT SHORT TERM GOAL #5   Title Pt to be able to walk for 20 mintues without increased pain in her left knee    Time 3   Period Weeks   Status On-going           PT Long Term Goals - 07/26/16 1213      PT LONG TERM GOAL #1   Title Pt pain to be on greater than a 2/10 to allow pt to sleep for up to 6 hours    Time 6   Period Weeks   Status On-going     PT LONG TERM GOAL #2   Title Pt to be sleeping in her bed again.    Time 6   Period Weeks   Status On-going     PT LONG TERM GOAL #3   Title Pt strength in her Lt LE to be at least a 4+/5 in all musculature to allow pt to go up and down 10 steps in a reciprocal manner without difficulty.    Time 6   Period Weeks   Status On-going     PT LONG TERM GOAL #4   Title Pt to be able to ambulate for an hour without increased pain in her  Lt knee to allow pt to shop in comfort    Time 6   Period Weeks   Status On-going     PT LONG TERM GOAL #5   Title Pt Lt knee ROM  edema to be decreased by 3 cm to allow improved flextion to  120 degrees to allow pt to sit for 2 hours in comfort for traveling.    Time 6   Period Weeks   Status On-going                Plan - 07/28/16 1108    Clinical Impression Statement Session focus on improving ROM and gait mechanics.  Utilized therex and manual techniques to assist with edema control to assist with ROM.  Noted decreased edema with circumduction measurement around knee joint to 46 cm.  Improved ROM following manual to 4-120 degrees.  Improved gait mechanics with good heel to toe pattern with minimal cueing.     Rehab Potential Good   PT Frequency 3x / week   PT Duration 6 weeks   PT Treatment/Interventions ADLs/Self Care Home Management;Electrical Stimulation;Cryotherapy;Balance training;Therapeutic exercise;Therapeutic activities;Stair training;Gait training;Patient/family education;Manual techniques;Manual lymph drainage;Passive range of motion   PT Next Visit Plan Continue to address heel toe gait  with manual to decrease edema; focus on ROM then functional strengthening; measure edema at knee joint to see if it is decreasing at all.        Patient will benefit from skilled therapeutic intervention in order to improve the following deficits and impairments:  Abnormal gait, Decreased activity tolerance, Decreased balance, Decreased range of motion, Difficulty walking, Increased edema, Decreased strength, Pain  Visit Diagnosis: Acute pain of left knee  Generalized edema  Stiffness of left knee, not elsewhere classified  Difficulty in walking, not elsewhere classified     Problem List Patient Active Problem List   Diagnosis Date Noted  . Primary osteoarthritis of left knee 06/29/2016  . Sinusitis 11/25/2015  . Diabetes mellitus type 2, uncomplicated ([place]) [id number]  . GERD (gastroesophageal reflux disease) 04/16/2015  . Constipation 04/16/2015  . Dyspepsia 04/16/2015  . Chronic venous insufficiency 02/10/2015  . Otitis, externa, infective 02/10/2015  . Osteoarthritis 02/10/2015  . Obesity 02/10/2015  . OSA (obstructive sleep apnea) 09/05/2012  . Essential  hypertension, benign 12/31/2011   Ihor Austin, [place]; [place]  Aldona Lento 07/28/2016, 11:20 AM  [place], [place], [address] Phone: [phone]   Fax:  [phone]  Name: [name] MRN: [id number] Date of Birth: 15-Mar-1956

## 2016-07-31 ENCOUNTER — Telehealth (HOSPITAL_COMMUNITY): Payer: Self-pay | Admitting: Internal Medicine

## 2016-07-31 ENCOUNTER — Ambulatory Visit (HOSPITAL_COMMUNITY): Payer: Managed Care, Other (non HMO) | Admitting: Physical Therapy

## 2016-07-31 NOTE — Telephone Encounter (Signed)
07/31/16 she called to say she would be late but I explained that if she was more than 15 minutes we would need to reschedule her.... She never came in.

## 2016-08-02 ENCOUNTER — Ambulatory Visit (HOSPITAL_COMMUNITY): Payer: Managed Care, Other (non HMO) | Attending: Orthopedic Surgery

## 2016-08-02 DIAGNOSIS — M25562 Pain in left knee: Secondary | ICD-10-CM | POA: Diagnosis present

## 2016-08-02 DIAGNOSIS — M25662 Stiffness of left knee, not elsewhere classified: Secondary | ICD-10-CM | POA: Diagnosis present

## 2016-08-02 DIAGNOSIS — R601 Generalized edema: Secondary | ICD-10-CM

## 2016-08-02 DIAGNOSIS — R262 Difficulty in walking, not elsewhere classified: Secondary | ICD-10-CM | POA: Diagnosis present

## 2016-08-02 NOTE — Therapy (Signed)
[place], [place], [address] Phone: [phone]   Fax:  [phone]  Physical Therapy Treatment  Patient Details  Name: [name] MRN: [id number] Date of Birth: 1956/02/08 Referring Provider: Rod Can  Encounter Date: 08/02/2016      PT End of Session - 08/02/16 1149    Visit Number 4   Number of Visits 18   Date for PT Re-Evaluation 08/23/16   Authorization Type [employer]   Authorization - Visit Number 4   Authorization - Number of Visits 10   PT Start Time (P)  1138  pt late for apt   PT Stop Time (P)  1208   PT Time Calculation (min) (P)  30 min   Activity Tolerance Patient tolerated treatment well   Behavior During Therapy [facility] for tasks assessed/performed      Past Medical History:  Diagnosis Date  . Breast cancer [facility]) 2011   right breast with axillary lymph node removal  . Diabetes mellitus, type II ([place])   . GERD (gastroesophageal reflux disease)   . Hypertension   . Osteoarthritis of both knees   . Pneumonia    in early 1980's    Past Surgical History:  Procedure Laterality Date  . ABDOMINAL HYSTERECTOMY    . APPENDECTOMY  1978  . BLADDER SUSPENSION    . KNEE ARTHROPLASTY Left 06/29/2016   Procedure: LEFT TOTAL KNEE WITH NAVIGATION;  Surgeon: Rod Can, MD;  Location: [facility];  Service: Orthopedics;  Laterality: Left;  Marland Kitchen MASTECTOMY, PARTIAL     right breast  . MENISCUS REPAIR     right knee  . OOPHORECTOMY      There were no vitals filed for this visit.      Subjective Assessment - 08/02/16 1138    Subjective Pt stated she is having difficulty sleeping at night, taking pain meds but knee continues to feel really tight and painful.  Reports she slept last night in her bed for 2 hours, first time since surgery.  Reports riding bike for 15 minutes this morning and continues HEP   Patient Stated Goals Sleep better, get back into her bed, walk without quad cane, complete  steps in a reciprocal manner and decrease pain    Currently in Pain? Yes   Pain Score 5    Pain Location Knee   Pain Orientation Left   Pain Descriptors / Indicators Tightness  tight and stiff   Pain Type Surgical pain   Pain Onset 1 to 4 weeks ago   Pain Frequency Constant   Aggravating Factors  bending   Pain Relieving Factors icing and medication   Effect of Pain on Daily Activities limited              [facility] Adult PT Treatment/Exercise - 08/02/16 0001      Ambulation/Gait   Ambulation Distance (Feet) 552 Feet   Assistive device None   Gait Comments worked on heel toe gait pattern      Knee/Hip Exercises: Standing   Heel Raises Both;10 reps   Heel Raises Limitations toe raises   Functional Squat 10 reps     Knee/Hip Exercises: Seated   Sit to Sand 10 reps;without UE support     Knee/Hip Exercises: Supine   Quad Sets 10 reps   Quad Sets Limitations with heel slides   Heel Slides Left;10 reps   Terminal Knee Extension Left;Both;10 reps   Straight Leg Raises Left;10 reps   Straight Leg  Raises Limitations with quad set prior to reduce extension laggin     Manual Therapy   Manual Therapy Edema management;Joint mobilization   Manual therapy comments done seperate from all other aspects of treatment    Edema Management Retro massage for edema control   Joint Mobilization patellar mobs                   PT Short Term Goals - 07/26/16 1212      PT SHORT TERM GOAL #1   Title Pt pain in her left knee to be no greater than a 6/10 to allow pt to sleep for three hours at at time.    Time 3   Period Weeks   Status On-going     PT SHORT TERM GOAL #2   Title Pt to be ambulating without an assistive device   Time 3   Period Weeks   Status Achieved     PT SHORT TERM GOAL #3   Title Pt Lt knee  extension ROM to be less than four degrees in her left knee to allow normalized gait    Time 3   Period Weeks   Status On-going     PT SHORT TERM GOAL #4    Title Pt Lt knee flexion ROM to be to 115 to allow pt to sit for an hour without discomfort to eat a meal at a restaurant.    Time 3   Period Weeks   Status On-going     PT SHORT TERM GOAL #5   Title Pt to be able to walk for 20 mintues without increased pain in her left knee    Time 3   Period Weeks   Status On-going           PT Long Term Goals - 07/26/16 1213      PT LONG TERM GOAL #1   Title Pt pain to be on greater than a 2/10 to allow pt to sleep for up to 6 hours    Time 6   Period Weeks   Status On-going     PT LONG TERM GOAL #2   Title Pt to be sleeping in her bed again.    Time 6   Period Weeks   Status On-going     PT LONG TERM GOAL #3   Title Pt strength in her Lt LE to be at least a 4+/5 in all musculature to allow pt to go up and down 10 steps in a reciprocal manner without difficulty.    Time 6   Period Weeks   Status On-going     PT LONG TERM GOAL #4   Title Pt to be able to ambulate for an hour without increased pain in her Lt knee to allow pt to shop in comfort    Time 6   Period Weeks   Status On-going     PT LONG TERM GOAL #5   Title Pt Lt knee ROM  edema to be decreased by 3 cm to allow improved flextion to  120 degrees to allow pt to sit for 2 hours in comfort for traveling.    Time 6   Period Weeks   Status On-going               Plan - 08/02/16 1155    Clinical Impression Statement Pt making significant gains with improved gait mechanics, decreased edema and improved ROM.  Continued session focus on ROM and functional strengthening.  Pt making significant gain with AROM to 2-120 following manual retro massage for edema control.  Noted improvements as well with decrease to 45cm with circumduction measurement around knee joint.  Pt improving heel to toe pattern gait mechanics with no cueing required.  EOS reports improved flexibilty and pain reduced.     Rehab Potential Good   PT Frequency 3x / week   PT Duration 6 weeks   PT  Treatment/Interventions ADLs/Self Care Home Management;Electrical Stimulation;Cryotherapy;Balance training;Therapeutic exercise;Therapeutic activities;Stair training;Gait training;Patient/family education;Manual techniques;Manual lymph drainage;Passive range of motion   PT Next Visit Plan Next session begin rocker board for equal weight distribution and heel to toe; knee drives for knee mobilty, functional strengthening including lunges and step ups; continue with manual and measure edema at knee joint to see if edema is decreasing.        Patient will benefit from skilled therapeutic intervention in order to improve the following deficits and impairments:  Abnormal gait, Decreased activity tolerance, Decreased balance, Decreased range of motion, Difficulty walking, Increased edema, Decreased strength, Pain  Visit Diagnosis: Acute pain of left knee  Generalized edema  Stiffness of left knee, not elsewhere classified  Difficulty in walking, not elsewhere classified     Problem List Patient Active Problem List   Diagnosis Date Noted  . Primary osteoarthritis of left knee 06/29/2016  . Sinusitis 11/25/2015  . Diabetes mellitus type 2, uncomplicated ([place]) [id number]  . GERD (gastroesophageal reflux disease) 04/16/2015  . Constipation 04/16/2015  . Dyspepsia 04/16/2015  . Chronic venous insufficiency 02/10/2015  . Otitis, externa, infective 02/10/2015  . Osteoarthritis 02/10/2015  . Obesity 02/10/2015  . OSA (obstructive sleep apnea) 09/05/2012  . Essential hypertension, benign 12/31/2011   Ihor Austin, [place]; [place]  Aldona Lento 08/02/2016, 12:58 PM  [place], [place], [address] Phone: [phone]   Fax:  [phone]  Name: Inayat Neave MRN: [id number] Date of Birth: 1956-02-14

## 2016-08-04 ENCOUNTER — Ambulatory Visit (HOSPITAL_COMMUNITY): Payer: Managed Care, Other (non HMO)

## 2016-08-04 DIAGNOSIS — R262 Difficulty in walking, not elsewhere classified: Secondary | ICD-10-CM

## 2016-08-04 DIAGNOSIS — R601 Generalized edema: Secondary | ICD-10-CM

## 2016-08-04 DIAGNOSIS — M25562 Pain in left knee: Secondary | ICD-10-CM | POA: Diagnosis not present

## 2016-08-04 DIAGNOSIS — M25662 Stiffness of left knee, not elsewhere classified: Secondary | ICD-10-CM

## 2016-08-04 NOTE — Therapy (Signed)
[place], [place], [address] Phone: [phone]   Fax:  [phone]  Physical Therapy Treatment  Patient Details  Name: [name] MRN: [id number] Date of Birth: Dec 06, 1955 Referring Provider: Rod Can  Encounter Date: 08/04/2016      PT End of Session - 08/04/16 1131    Visit Number 5   Number of Visits 18   Date for PT Re-Evaluation 08/23/16   Authorization - Visit Number 5   Authorization - Number of Visits 10   PT Start Time 1118   PT Stop Time 1200   PT Time Calculation (min) 42 min   Activity Tolerance Patient tolerated treatment well   Behavior During Therapy [facility] for tasks assessed/performed      Past Medical History:  Diagnosis Date  . Breast cancer [facility]) 2011   right breast with axillary lymph node removal  . Diabetes mellitus, type II ([place])   . GERD (gastroesophageal reflux disease)   . Hypertension   . Osteoarthritis of both knees   . Pneumonia    in early 1980's    Past Surgical History:  Procedure Laterality Date  . ABDOMINAL HYSTERECTOMY    . APPENDECTOMY  1978  . BLADDER SUSPENSION    . KNEE ARTHROPLASTY Left 06/29/2016   Procedure: LEFT TOTAL KNEE WITH NAVIGATION;  Surgeon: Rod Can, MD;  Location: [facility];  Service: Orthopedics;  Laterality: Left;  Marland Kitchen MASTECTOMY, PARTIAL     right breast  . MENISCUS REPAIR     right knee  . OOPHORECTOMY      There were no vitals filed for this visit.      Subjective Assessment - 08/04/16 1122    Subjective Pt stated she had improved sleep last night and knee has minimal pain today, mainly aching pain scale 2/10.   Knee swollen and reports of warm to touch.   Patient Stated Goals Sleep better, get back into her bed, walk without quad cane, complete steps in a reciprocal manner and decrease pain    Currently in Pain? Yes   Pain Score 2    Pain Location Knee   Pain Orientation Left   Pain Descriptors / Indicators Aching   Pain Type Surgical pain   Pain Onset 1 to 4 weeks ago   Pain Frequency Constant   Aggravating Factors  bending   Pain Relieving Factors icing and medication   Effect of Pain on Daily Activities limited                         [facility] Adult PT Treatment/Exercise - 08/04/16 0001      Knee/Hip Exercises: Stretches   Active Hamstring Stretch Left;3 reps;30 seconds   Active Hamstring Stretch Limitations supine     Knee/Hip Exercises: Standing   Heel Raises 15 reps   Heel Raises Limitations toe raises   Lateral Step Up Left;10 reps;Hand Hold: 2;Step Height: 4"   Forward Step Up Left;10 reps;Hand Hold: 1;Step Height: 6"   Forward Step Up Limitations initial 4in then 6in   Functional Squat 10 reps   Rocker Board 2 minutes   Rocker Board Limitations R/L and A/P     Knee/Hip Exercises: Seated   Sit to Sand 10 reps;without UE support     Knee/Hip Exercises: Supine   Terminal Knee Extension Left;15 reps   Bridges Limitations 10   Straight Leg Raises Left;10 reps   Straight Leg Raises Limitations with quad set  prior to reduce extension laggin   Other Supine Knee/Hip Exercises AROM 1-120     Manual Therapy   Manual Therapy Edema management;Joint mobilization   Manual therapy comments done seperate from all other aspects of treatment    Edema Management Retro massage for edema control   Joint Mobilization patellar mobs                   PT Short Term Goals - 07/26/16 1212      PT SHORT TERM GOAL #1   Title Pt pain in her left knee to be no greater than a 6/10 to allow pt to sleep for three hours at at time.    Time 3   Period Weeks   Status On-going     PT SHORT TERM GOAL #2   Title Pt to be ambulating without an assistive device   Time 3   Period Weeks   Status Achieved     PT SHORT TERM GOAL #3   Title Pt Lt knee  extension ROM to be less than four degrees in her left knee to allow normalized gait    Time 3   Period Weeks   Status On-going      PT SHORT TERM GOAL #4   Title Pt Lt knee flexion ROM to be to 115 to allow pt to sit for an hour without discomfort to eat a meal at a restaurant.    Time 3   Period Weeks   Status On-going     PT SHORT TERM GOAL #5   Title Pt to be able to walk for 20 mintues without increased pain in her left knee    Time 3   Period Weeks   Status On-going           PT Long Term Goals - 07/26/16 1213      PT LONG TERM GOAL #1   Title Pt pain to be on greater than a 2/10 to allow pt to sleep for up to 6 hours    Time 6   Period Weeks   Status On-going     PT LONG TERM GOAL #2   Title Pt to be sleeping in her bed again.    Time 6   Period Weeks   Status On-going     PT LONG TERM GOAL #3   Title Pt strength in her Lt LE to be at least a 4+/5 in all musculature to allow pt to go up and down 10 steps in a reciprocal manner without difficulty.    Time 6   Period Weeks   Status On-going     PT LONG TERM GOAL #4   Title Pt to be able to ambulate for an hour without increased pain in her Lt knee to allow pt to shop in comfort    Time 6   Period Weeks   Status On-going     PT LONG TERM GOAL #5   Title Pt Lt knee ROM  edema to be decreased by 3 cm to allow improved flextion to  120 degrees to allow pt to sit for 2 hours in comfort for traveling.    Time 6   Period Weeks   Status On-going               Plan - 08/04/16 1206    Clinical Impression Statement Pt progressing well towards goals with reports of improved sleep last night due to pain reduced, improved gait mechanics and  AROM at 1-120 degrees today.  Progressed to closed chain kinetic exercises for functional strengthening with good form and technqiue following initial demonstration.  EOS pt reports pain resolved.   Rehab Potential Good   PT Frequency 3x / week   PT Duration 6 weeks   PT Treatment/Interventions ADLs/Self Care Home Management;Electrical Stimulation;Cryotherapy;Balance training;Therapeutic  exercise;Therapeutic activities;Stair training;Gait training;Patient/family education;Manual techniques;Manual lymph drainage;Passive range of motion   PT Next Visit Plan Continue with current PT POC with increased focus on functional strengthening as knee ROM at 1-120.  Add standing TKE next session and progress to lunges and step ups as able.  Continue wiht manual and circumduction measurements at knee joint to assess edema reducing.        Patient will benefit from skilled therapeutic intervention in order to improve the following deficits and impairments:  Abnormal gait, Decreased activity tolerance, Decreased balance, Decreased range of motion, Difficulty walking, Increased edema, Decreased strength, Pain  Visit Diagnosis: Acute pain of left knee  Generalized edema  Stiffness of left knee, not elsewhere classified  Difficulty in walking, not elsewhere classified     Problem List Patient Active Problem List   Diagnosis Date Noted  . Primary osteoarthritis of left knee 06/29/2016  . Sinusitis 11/25/2015  . Diabetes mellitus type 2, uncomplicated ([place]) [id number]  . GERD (gastroesophageal reflux disease) 04/16/2015  . Constipation 04/16/2015  . Dyspepsia 04/16/2015  . Chronic venous insufficiency 02/10/2015  . Otitis, externa, infective 02/10/2015  . Osteoarthritis 02/10/2015  . Obesity 02/10/2015  . OSA (obstructive sleep apnea) 09/05/2012  . Essential hypertension, benign 12/31/2011   Ihor Austin, [place]; [place]  Aldona Lento 08/04/2016, 12:15 PM  [place], [place], [address] Phone: [phone]   Fax:  [phone]  Name: [name] MRN: [id number] Date of Birth: Jan 28, 1956

## 2016-08-07 ENCOUNTER — Ambulatory Visit (HOSPITAL_COMMUNITY): Payer: Managed Care, Other (non HMO) | Admitting: Physical Therapy

## 2016-08-07 DIAGNOSIS — M25562 Pain in left knee: Secondary | ICD-10-CM

## 2016-08-07 DIAGNOSIS — M25662 Stiffness of left knee, not elsewhere classified: Secondary | ICD-10-CM

## 2016-08-07 DIAGNOSIS — R601 Generalized edema: Secondary | ICD-10-CM

## 2016-08-07 DIAGNOSIS — R262 Difficulty in walking, not elsewhere classified: Secondary | ICD-10-CM

## 2016-08-07 NOTE — Therapy (Signed)
[place], [place], [address] Phone: [phone]   Fax:  [phone]  Physical Therapy Treatment  Patient Details  Name: [name] MRN: [id number] Date of Birth: 1956-06-23 Referring Provider: Rod Can  Encounter Date: 08/07/2016      PT End of Session - 08/07/16 1133    Visit Number 6   Number of Visits 18   Date for PT Re-Evaluation 08/23/16   Authorization - Visit Number 6   Authorization - Number of Visits 10   PT Start Time 1120   PT Stop Time 1200   PT Time Calculation (min) 40 min   Activity Tolerance Patient tolerated treatment well   Behavior During Therapy [facility] for tasks assessed/performed      Past Medical History:  Diagnosis Date  . Breast cancer [facility]) 2011   right breast with axillary lymph node removal  . Diabetes mellitus, type II ([place])   . GERD (gastroesophageal reflux disease)   . Hypertension   . Osteoarthritis of both knees   . Pneumonia    in early 1980's    Past Surgical History:  Procedure Laterality Date  . ABDOMINAL HYSTERECTOMY    . APPENDECTOMY  1978  . BLADDER SUSPENSION    . KNEE ARTHROPLASTY Left 06/29/2016   Procedure: LEFT TOTAL KNEE WITH NAVIGATION;  Surgeon: Rod Can, MD;  Location: [facility];  Service: Orthopedics;  Laterality: Left;  Marland Kitchen MASTECTOMY, PARTIAL     right breast  . MENISCUS REPAIR     right knee  . OOPHORECTOMY      There were no vitals filed for this visit.      Subjective Assessment - 08/07/16 1125    Subjective Pt states that she went to church for the first time since the operation and it went well    How long can you sit comfortably? hours    How long can you stand comfortably? 45 minutes was 30 minutes    How long can you walk comfortably? 45 minutes was 10-15 minutes    Patient Stated Goals Sleep better, get back into her bed, walk without quad cane, complete steps in a reciprocal manner and decrease pain    Pain Score 2    Pain Location Leg   Pain Orientation Left   Pain Descriptors / Indicators Aching   Pain Type Surgical pain   Pain Onset 1 to 4 weeks ago   Pain Frequency Occasional   Aggravating Factors  weight bearing    Pain Relieving Factors ice   Effect of Pain on Daily Activities increases after a while            [facility] PT Assessment - 08/07/16 0001      Assessment   Medical Diagnosis Lt TKR   Onset Date/Surgical Date 06/29/16   Next MD Visit 08/16/2016   Prior Therapy acute/ HH     Observation/Other Assessments-Edema    Edema Circumferential  Lt knee joint 47cm     AROM   Left Knee Extension 2   Left Knee Flexion 120     Ambulation/Gait   Ambulation Distance (Feet) 552 Feet   Gait Comments worked on heel toe gait pattern                      [facility] Adult PT Treatment/Exercise - 08/07/16 0001      Knee/Hip Exercises: Standing   Heel Raises Both;15 reps   Terminal Knee Extension Limitations x10  Functional Squat 10 reps  limited by right knee    SLS Rt 50; Lt 45      Knee/Hip Exercises: Seated   Sit to Sand 10 reps;without UE support     Knee/Hip Exercises: Supine   Quad Sets 10 reps     Manual Therapy   Manual Therapy Edema management;Joint mobilization   Manual therapy comments done seperate from all other aspects of treatment    Manual Lymphatic Drainage (MLD) to decrease surgical edema in Lt LE                   PT Short Term Goals - 08/07/16 1133      PT SHORT TERM GOAL #1   Title Pt pain in her left knee to be no greater than a 6/10 to allow pt to sleep for three hours at at time.    Time 3   Period Weeks   Status Achieved     PT SHORT TERM GOAL #2   Title Pt to be ambulating without an assistive device   Time 3   Period Weeks   Status Achieved     PT SHORT TERM GOAL #3   Title Pt Lt knee  extension ROM to be less than four degrees in her left knee to allow normalized gait    Time 3   Period Weeks   Status On-going     PT  SHORT TERM GOAL #4   Title Pt Lt knee flexion ROM to be to 115 to allow pt to sit for an hour without discomfort to eat a meal at a restaurant.    Time 3   Period Weeks   Status Achieved     PT SHORT TERM GOAL #5   Title Pt to be able to walk for 20 mintues without increased pain in her left knee    Time 3   Period Weeks   Status Achieved           PT Long Term Goals - 08/07/16 1133      PT LONG TERM GOAL #1   Title Pt pain to be on greater than a 2/10 to allow pt to sleep for up to 6 hours    Time 6   Period Weeks   Status On-going     PT LONG TERM GOAL #2   Title Pt to be sleeping in her bed again.    Time 6   Period Weeks   Status Partially Met     PT LONG TERM GOAL #3   Title Pt strength in her Lt LE to be at least a 4+/5 in all musculature to allow pt to go up and down 10 steps in a reciprocal manner without difficulty.    Time 6   Period Weeks   Status Partially Met     PT LONG TERM GOAL #4   Title Pt to be able to ambulate for an hour without increased pain in her Lt knee to allow pt to shop in comfort    Time 6   Period Weeks   Status On-going     PT LONG TERM GOAL #5   Title Pt Lt knee ROM  edema to be decreased by 3 cm to allow improved flextion to  120 degrees to allow pt to sit for 2 hours in comfort for traveling.    Baseline swelling is not down but ROM is at 120 and pt is able to sit for 2 hours  Time 6   Period Weeks   Status Partially Met               Plan - 08/07/16 1135    Clinical Impression Statement Pt continues to improve functionally as well as objectively. Main concern at this time is her sleeping.  She is only able to sleep for four hours straight.  We will continue to work on edema control to try and improve this.     Rehab Potential Good   PT Frequency 3x / week   PT Duration 6 weeks   PT Treatment/Interventions ADLs/Self Care Home Management;Electrical Stimulation;Cryotherapy;Balance training;Therapeutic  exercise;Therapeutic activities;Stair training;Gait training;Patient/family education;Manual techniques;Manual lymph drainage;Passive range of motion   PT Next Visit Plan Add e-stim to decrease swelling       Patient will benefit from skilled therapeutic intervention in order to improve the following deficits and impairments:  Abnormal gait, Decreased activity tolerance, Decreased balance, Decreased range of motion, Difficulty walking, Increased edema, Decreased strength, Pain  Visit Diagnosis: Acute pain of left knee  Generalized edema  Stiffness of left knee, not elsewhere classified  Difficulty in walking, not elsewhere classified     Problem List Patient Active Problem List   Diagnosis Date Noted  . Primary osteoarthritis of left knee 06/29/2016  . Sinusitis 11/25/2015  . Diabetes mellitus type 2, uncomplicated ([place]) 35/57/3220  . GERD (gastroesophageal reflux disease) 04/16/2015  . Constipation 04/16/2015  . Dyspepsia 04/16/2015  . Chronic venous insufficiency 02/10/2015  . Otitis, externa, infective 02/10/2015  . Osteoarthritis 02/10/2015  . Obesity 02/10/2015  . OSA (obstructive sleep apnea) 09/05/2012  . Essential hypertension, benign 12/31/2011    Rayetta Humphrey, PT CLT [phone] 08/07/2016, 11:58 AM  [place] [address] [place], [place], [address] Phone: [phone]   Fax:  [phone]  Name: [name] MRN: [id number] Date of Birth: May 04, 1956

## 2016-08-09 ENCOUNTER — Ambulatory Visit (HOSPITAL_COMMUNITY): Payer: Managed Care, Other (non HMO)

## 2016-08-09 DIAGNOSIS — M25562 Pain in left knee: Secondary | ICD-10-CM

## 2016-08-09 DIAGNOSIS — R601 Generalized edema: Secondary | ICD-10-CM

## 2016-08-09 DIAGNOSIS — R262 Difficulty in walking, not elsewhere classified: Secondary | ICD-10-CM

## 2016-08-09 DIAGNOSIS — M25662 Stiffness of left knee, not elsewhere classified: Secondary | ICD-10-CM

## 2016-08-09 NOTE — Therapy (Signed)
[place], [place], [address] Phone: [phone]   Fax:  [phone]  Physical Therapy Treatment  Patient Details  Name: [name] MRN: [id number] Date of Birth: [birth year] Referring Provider: Rod Can  Encounter Date: 08/09/2016      PT End of Session - 08/09/16 1137    Visit Number 7   Number of Visits 18   Date for PT Re-Evaluation 08/23/16   Authorization Type [employer]   Authorization - Visit Number 7   Authorization - Number of Visits 10   PT Start Time 1120   PT Stop Time 1206   PT Time Calculation (min) 46 min   Activity Tolerance Patient tolerated treatment well   Behavior During Therapy [facility] for tasks assessed/performed      Past Medical History:  Diagnosis Date  . Breast cancer [facility]) 2011   right breast with axillary lymph node removal  . Diabetes mellitus, type II ([place])   . GERD (gastroesophageal reflux disease)   . Hypertension   . Osteoarthritis of both knees   . Pneumonia    in early 1980's    Past Surgical History:  Procedure Laterality Date  . ABDOMINAL HYSTERECTOMY    . APPENDECTOMY  1978  . BLADDER SUSPENSION    . KNEE ARTHROPLASTY Left 06/29/2016   Procedure: LEFT TOTAL KNEE WITH NAVIGATION;  Surgeon: Rod Can, MD;  Location: [facility];  Service: Orthopedics;  Laterality: Left;  Marland Kitchen MASTECTOMY, PARTIAL     right breast  . MENISCUS REPAIR     right knee  . OOPHORECTOMY      There were no vitals filed for this visit.      Subjective Assessment - 08/09/16 1125    Subjective Pt stated she has run out of pain medication, current pain scale 2/10 achey pain Lt knee   How long can you sit comfortably? hours    How long can you stand comfortably? Able to stand for 45 minutes was 30 minutes    How long can you walk comfortably? 2 hours with increased swelling folloiwng (was 45 minutes was 10-15 minutes)   Patient Stated Goals Sleep better, get back into her bed, walk  without quad cane, complete steps in a reciprocal manner and decrease pain    Currently in Pain? Yes   Pain Score 2    Pain Location Knee   Pain Orientation Left;Medial   Pain Descriptors / Indicators Aching   Pain Type Surgical pain   Pain Onset 1 to 4 weeks ago   Pain Frequency Occasional   Aggravating Factors  weight bearing   Pain Relieving Factors ice   Effect of Pain on Daily Activities increases after a while            [facility] PT Assessment - 08/09/16 0001      Assessment   Medical Diagnosis Lt TKR   Referring Provider Aaron Edelman Swinteck   Onset Date/Surgical Date 06/29/16   Next MD Visit 08/16/2016   Prior Therapy acute/ HH     Observation/Other Assessments-Edema    Edema Circumferential  Lt knee jt 46cm was 48.8cm RT:43.3                     [facility] Adult PT Treatment/Exercise - 08/09/16 0001      Knee/Hip Exercises: Standing   Heel Raises Both;15 reps   Heel Raises Limitations toe raises   Terminal Knee Extension Limitations x10   Functional Squat 10 reps  limited by Rt knee   Stairs 2RT 7in reciprocal pattern 1 HR     Knee/Hip Exercises: Seated   Sit to Sand 10 reps;without UE support     Knee/Hip Exercises: Supine   Heel Slides Left;10 reps   Heel Slides Limitations 1-120   Other Supine Knee/Hip Exercises AROM 1-120   Other Supine Knee/Hip Exercises ankle pumps complete during estim with LE elevated x 10 min     Modalities   Modalities Electrical Stimulation     Electrical Stimulation   Electrical Stimulation Location Lt knee   Electrical Stimulation Action edema control   Electrical Stimulation Parameters hi volt negative polarity 250 V x 10 min   Electrical Stimulation Goals Edema                  PT Short Term Goals - 08/09/16 1150      PT SHORT TERM GOAL #1   Title Pt pain in her left knee to be no greater than a 6/10 to allow pt to sleep for three hours at at time.    Baseline 08/09/2016:  Pain scale range 1-4/10 with  abilty to sleep for 4-5 hours with sleeping medication   Status Achieved     PT SHORT TERM GOAL #2   Title Pt to be ambulating without an assistive device   Status Achieved     PT SHORT TERM GOAL #3   Title Pt Lt knee  extension ROM to be less than four degrees in her left knee to allow normalized gait    Baseline 08/09/2016 AROM 1-120   Status Achieved     PT SHORT TERM GOAL #4   Title Pt Lt knee flexion ROM to be to 115 to allow pt to sit for an hour without discomfort to eat a meal at a restaurant.    Baseline 08/09/2016 AROM 1-120   Status Achieved     PT SHORT TERM GOAL #5   Title Pt to be able to walk for 20 mintues without increased pain in her left knee    Status Achieved           PT Long Term Goals - 08/09/16 1153      PT LONG TERM GOAL #1   Title Pt pain to be on greater than a 2/10 to allow pt to sleep for up to 6 hours    Baseline 08/09/2016:  Pain scale range 1-4/10 with abilty to sleep for 4-5 hours with sleeping medication   Status On-going     PT LONG TERM GOAL #2   Title Pt to be sleeping in her bed again.    Baseline 08/09/2016:  Pt has returned to sleeping in bed this week   Status Achieved     PT LONG TERM GOAL #3   Title Pt strength in her Lt LE to be at least a 4+/5 in all musculature to allow pt to go up and down 10 steps in a reciprocal manner without difficulty.    Status Partially Met     PT LONG TERM GOAL #4   Title Pt to be able to ambulate for an hour without increased pain in her Lt knee to allow pt to shop in comfort    Baseline 08/09/2016: Able to ambulate an hour with increased pain and edema following   Status On-going     PT LONG TERM GOAL #5   Title Pt Lt knee ROM  edema to be decreased by 3 cm to allow  improved flextion to  120 degrees to allow pt to sit for 2 hours in comfort for traveling.    Baseline 08/09/2016 Lt knee jt 46cm was 48.8 cm; RT:43.3; AROM 1-120; able to sit comfortably for 2 hours with reports of increased  stiffness following 2 hours   Status Partially Met               Plan - 08/09/16 1138    Clinical Impression Statement Pt continued to improve functionally as well as objectively with reports of improved activity tolerance without rest breaks and improved sleeping last night with abilty to sleep for 5 hours straight (with medication assistance).  Pt does continue to present with increased edema lateral aspect of knee.  Reviewed contraindications and got the OK from eval PT to begin hi volt e-stim to assist with edema control.  Reviewed goals prior MD apt next week with 5/5 STGs met, 1/5 LTG and progressing well towards all other goals.     Rehab Potential Good   PT Frequency 3x / week   PT Treatment/Interventions ADLs/Self Care Home Management;Electrical Stimulation;Cryotherapy;Balance training;Therapeutic exercise;Therapeutic activities;Stair training;Gait training;Patient/family education;Manual techniques;Manual lymph drainage;Passive range of motion   PT Next Visit Plan F/u on estim results with decreased swelling.      Patient will benefit from skilled therapeutic intervention in order to improve the following deficits and impairments:  Abnormal gait, Decreased activity tolerance, Decreased balance, Decreased range of motion, Difficulty walking, Increased edema, Decreased strength, Pain  Visit Diagnosis: Acute pain of left knee  Generalized edema  Stiffness of left knee, not elsewhere classified  Difficulty in walking, not elsewhere classified     Problem List Patient Active Problem List   Diagnosis Date Noted  . Primary osteoarthritis of left knee 06/29/2016  . Sinusitis 11/25/2015  . Diabetes mellitus type 2, uncomplicated ([place]) 50/53/9767  . GERD (gastroesophageal reflux disease) 04/16/2015  . Constipation 04/16/2015  . Dyspepsia 04/16/2015  . Chronic venous insufficiency 02/10/2015  . Otitis, externa, infective 02/10/2015  . Osteoarthritis 02/10/2015  . Obesity  02/10/2015  . OSA (obstructive sleep apnea) 09/05/2012  . Essential hypertension, benign 12/31/2011   Ihor Austin, [place]; [place]  Aldona Lento 08/09/2016, 12:23 PM  [place], [place], [address] Phone: [phone]   Fax:  [phone]  Name: Morissa Obeirne MRN: [id number] Date of Birth: 11-19-1955

## 2016-08-11 ENCOUNTER — Ambulatory Visit (HOSPITAL_COMMUNITY): Payer: Managed Care, Other (non HMO)

## 2016-08-11 ENCOUNTER — Other Ambulatory Visit: Payer: Self-pay | Admitting: Internal Medicine

## 2016-08-11 DIAGNOSIS — R262 Difficulty in walking, not elsewhere classified: Secondary | ICD-10-CM

## 2016-08-11 DIAGNOSIS — R601 Generalized edema: Secondary | ICD-10-CM

## 2016-08-11 DIAGNOSIS — M25562 Pain in left knee: Secondary | ICD-10-CM

## 2016-08-11 DIAGNOSIS — M25662 Stiffness of left knee, not elsewhere classified: Secondary | ICD-10-CM

## 2016-08-11 DIAGNOSIS — I5022 Chronic systolic (congestive) heart failure: Secondary | ICD-10-CM

## 2016-08-11 NOTE — Therapy (Signed)
[place], [place], [address] Phone: [phone]   Fax:  [phone]  Physical Therapy Treatment  Patient Details  Name: [name] MRN: [id number] Date of Birth: Jul 12, 1956 Referring Provider: Rod Can  Encounter Date: 08/11/2016      PT End of Session - 08/11/16 1132    Visit Number 8   Number of Visits 18   Date for PT Re-Evaluation 08/23/16   Authorization Type [employer] allows 20 total (9 HHPT sessions)   Authorization - Visit Number 8   Authorization - Number of Visits 10   PT Start Time 1120   PT Stop Time 1212   PT Time Calculation (min) 52 min   Activity Tolerance Patient tolerated treatment well   Behavior During Therapy [facility] for tasks assessed/performed      Past Medical History:  Diagnosis Date  . Breast cancer [facility]) 2011   right breast with axillary lymph node removal  . Diabetes mellitus, type II ([place])   . GERD (gastroesophageal reflux disease)   . Hypertension   . Osteoarthritis of both knees   . Pneumonia    in early 1980's    Past Surgical History:  Procedure Laterality Date  . ABDOMINAL HYSTERECTOMY    . APPENDECTOMY  1978  . BLADDER SUSPENSION    . KNEE ARTHROPLASTY Left 06/29/2016   Procedure: LEFT TOTAL KNEE WITH NAVIGATION;  Surgeon: Rod Can, MD;  Location: [facility];  Service: Orthopedics;  Laterality: Left;  Marland Kitchen MASTECTOMY, PARTIAL     right breast  . MENISCUS REPAIR     right knee  . OOPHORECTOMY      There were no vitals filed for this visit.      Subjective Assessment - 08/11/16 1124    Subjective Pt stated current pain scale 2-3/10 with weight bearing Lt knee.  Pain medication taken prior session   Patient Stated Goals Sleep better, get back into her bed, walk without quad cane, complete steps in a reciprocal manner and decrease pain    Currently in Pain? Yes   Pain Score 2    Pain Location Knee   Pain Orientation Left;Anterior   Pain Descriptors /  Indicators Aching;Tightness  Stiffness with sharp pain end range flexion   Pain Type Surgical pain   Pain Onset 1 to 4 weeks ago   Pain Frequency Occasional   Aggravating Factors  weight bearing   Pain Relieving Factors ice    Effect of Pain on Daily Activities increases after a while            [facility] Adult PT Treatment/Exercise - 08/11/16 0001      Knee/Hip Exercises: Stretches   Active Hamstring Stretch Left;3 reps;30 seconds   Knee: Self-Stretch Limitations knee drives for mobility 38B 10" holds on 12in step   Gastroc Stretch 3 reps;30 seconds   Gastroc Stretch Limitations slant board     Knee/Hip Exercises: Standing   Terminal Knee Extension Limitations 10x   Functional Squat 15 reps   Stairs 4RT 7in 1 HR   Rocker Board 2 minutes   Rocker Board Limitations R/L and A/P   SLS Lt 42", Rt 38"    SLS with Vectors 3x 5" with intermittent HHA   Gait Training gait training with cueing to equalize weight bearing 230f   Other Standing Knee Exercises weight shifting     Knee/Hip Exercises: Supine   Short Arc Quad Sets 15 reps   Other Supine Knee/Hip Exercises ankle pumps  complete during estim with LE elevated x 10 min     Manual Therapy   Manual Therapy Myofascial release   Manual therapy comments done seperate from all other aspects of treatment    Myofascial Release Instructed scar tissue massage on incision                PT Education - 08/11/16 1222    Education provided Yes   Education Details Instructed scar tissue massage; given paperwork for compression hose for edema control   Person(s) Educated Patient   Methods Explanation;Demonstration;Handout   Comprehension Verbalized understanding;Returned demonstration          PT Short Term Goals - 08/09/16 1150      PT SHORT TERM GOAL #1   Title Pt pain in her left knee to be no greater than a 6/10 to allow pt to sleep for three hours at at time.    Baseline 08/09/2016:  Pain scale range 1-4/10 with  abilty to sleep for 4-5 hours with sleeping medication   Status Achieved     PT SHORT TERM GOAL #2   Title Pt to be ambulating without an assistive device   Status Achieved     PT SHORT TERM GOAL #3   Title Pt Lt knee  extension ROM to be less than four degrees in her left knee to allow normalized gait    Baseline 08/09/2016 AROM 1-120   Status Achieved     PT SHORT TERM GOAL #4   Title Pt Lt knee flexion ROM to be to 115 to allow pt to sit for an hour without discomfort to eat a meal at a restaurant.    Baseline 08/09/2016 AROM 1-120   Status Achieved     PT SHORT TERM GOAL #5   Title Pt to be able to walk for 20 mintues without increased pain in her left knee    Status Achieved           PT Long Term Goals - 08/09/16 1153      PT LONG TERM GOAL #1   Title Pt pain to be on greater than a 2/10 to allow pt to sleep for up to 6 hours    Baseline 08/09/2016:  Pain scale range 1-4/10 with abilty to sleep for 4-5 hours with sleeping medication   Status On-going     PT LONG TERM GOAL #2   Title Pt to be sleeping in her bed again.    Baseline 08/09/2016:  Pt has returned to sleeping in bed this week   Status Achieved     PT LONG TERM GOAL #3   Title Pt strength in her Lt LE to be at least a 4+/5 in all musculature to allow pt to go up and down 10 steps in a reciprocal manner without difficulty.    Status Partially Met     PT LONG TERM GOAL #4   Title Pt to be able to ambulate for an hour without increased pain in her Lt knee to allow pt to shop in comfort    Baseline 08/09/2016: Able to ambulate an hour with increased pain and edema following   Status On-going     PT LONG TERM GOAL #5   Title Pt Lt knee ROM  edema to be decreased by 3 cm to allow improved flextion to  120 degrees to allow pt to sit for 2 hours in comfort for traveling.    Baseline 08/09/2016 Lt knee jt 46cm was 48.8  cm; RT:43.3; AROM 1-120; able to sit comfortably for 2 hours with reports of increased  stiffness following 2 hours   Status Partially Met               Plan - 08/11/16 1203    Clinical Impression Statement Session focus on knee mobility, improving weight distribution with gait mechanics and functional strengthening.  Pt continues to be limited by edema.  Reports of decreased swelling especially medial aspect of knee.  Noted keloid scar tissue on incision, myofascial release techniques completed as well as instructed to pt. to begin at home.  Discussion held with benefits of compression hose, pt stated she has been prescribed compression hose from cardiologist but has not purchased yet.  Pt given information on compression hose purchase.   F/U next session if referral is needed.     Rehab Potential Good   PT Frequency 3x / week   PT Duration 6 weeks   PT Treatment/Interventions ADLs/Self Care Home Management;Electrical Stimulation;Cryotherapy;Balance training;Therapeutic exercise;Therapeutic activities;Stair training;Gait training;Patient/family education;Manual techniques;Manual lymph drainage;Passive range of motion   PT Next Visit Plan continue wiht high volt estim for swelling.  Progress functional strengthening as ROM at 1-120 degrees following mobility exercises.        Patient will benefit from skilled therapeutic intervention in order to improve the following deficits and impairments:  Abnormal gait, Decreased activity tolerance, Decreased balance, Decreased range of motion, Difficulty walking, Increased edema, Decreased strength, Pain  Visit Diagnosis: Acute pain of left knee  Generalized edema  Stiffness of left knee, not elsewhere classified  Difficulty in walking, not elsewhere classified     Problem List Patient Active Problem List   Diagnosis Date Noted  . Primary osteoarthritis of left knee 06/29/2016  . Sinusitis 11/25/2015  . Diabetes mellitus type 2, uncomplicated ([place]) 97/33/1250  . GERD (gastroesophageal reflux disease) 04/16/2015  .  Constipation 04/16/2015  . Dyspepsia 04/16/2015  . Chronic venous insufficiency 02/10/2015  . Otitis, externa, infective 02/10/2015  . Osteoarthritis 02/10/2015  . Obesity 02/10/2015  . OSA (obstructive sleep apnea) 09/05/2012  . Essential hypertension, benign 12/31/2011   Ihor Austin, [place]; [place]  Aldona Lento 08/11/2016, 12:24 PM  [place] [address] [place], [place], [address] Phone: [phone]   Fax:  [phone]  Name: [name] MRN: [id number] Date of Birth: July 04, 1956

## 2016-08-15 ENCOUNTER — Telehealth: Payer: Self-pay | Admitting: Internal Medicine

## 2016-08-15 NOTE — Telephone Encounter (Signed)
Patient walked in today to advise that the sig for the diclofenac (VOLTAREN) 75 MG EC tablet [id number]   prescribed is incorrect. It is currently written at once daily. Her ortho doc, Dr Rex Kras had been prescribing for BID- once in morning then at night. She is asking that we correct the sig to advise BID  Please notify patient

## 2016-08-17 ENCOUNTER — Ambulatory Visit (HOSPITAL_COMMUNITY): Payer: Managed Care, Other (non HMO) | Admitting: Physical Therapy

## 2016-08-17 DIAGNOSIS — M25562 Pain in left knee: Secondary | ICD-10-CM

## 2016-08-17 DIAGNOSIS — R601 Generalized edema: Secondary | ICD-10-CM

## 2016-08-17 DIAGNOSIS — R262 Difficulty in walking, not elsewhere classified: Secondary | ICD-10-CM

## 2016-08-17 DIAGNOSIS — M25662 Stiffness of left knee, not elsewhere classified: Secondary | ICD-10-CM

## 2016-08-17 NOTE — Therapy (Signed)
[place], [place], [address] Phone: [phone]   Fax:  [phone]  Physical Therapy Treatment  Patient Details  Name: [name] MRN: [id number] Date of Birth: [birth year] Referring Provider: Rod Can  Encounter Date: 08/17/2016      PT End of Session - 08/17/16 1803    Visit Number 9   Number of Visits 18   Date for PT Re-Evaluation 08/23/16   Authorization Type [employer] allows 20 total (9 HHPT sessions)   Authorization - Visit Number 9   Authorization - Number of Visits 10   PT Start Time 9937   PT Stop Time 1809   PT Time Calculation (min) 39 min   Activity Tolerance Patient tolerated treatment well   Behavior During Therapy [facility] for tasks assessed/performed      Past Medical History:  Diagnosis Date  . Breast cancer [facility]) 2011   right breast with axillary lymph node removal  . Diabetes mellitus, type II ([place])   . GERD (gastroesophageal reflux disease)   . Hypertension   . Osteoarthritis of both knees   . Pneumonia    in early 1980's    Past Surgical History:  Procedure Laterality Date  . ABDOMINAL HYSTERECTOMY    . APPENDECTOMY  1978  . BLADDER SUSPENSION    . KNEE ARTHROPLASTY Left 06/29/2016   Procedure: LEFT TOTAL KNEE WITH NAVIGATION;  Surgeon: Rod Can, MD;  Location: [facility];  Service: Orthopedics;  Laterality: Left;  Marland Kitchen MASTECTOMY, PARTIAL     right breast  . MENISCUS REPAIR     right knee  . OOPHORECTOMY      There were no vitals filed for this visit.      Subjective Assessment - 08/17/16 1732    Subjective Pt reports that things are going well. She doesn't have any pain currently, just when she twists or bends it too much.    Patient Stated Goals Sleep better, get back into her bed, walk without quad cane, complete steps in a reciprocal manner and decrease pain    Currently in Pain? No/denies   Pain Onset 1 to 4 weeks ago                         [facility] Adult PT Treatment/Exercise - 08/17/16 0001      Knee/Hip Exercises: Stretches   Active Hamstring Stretch 2 reps;30 seconds   Gastroc Stretch Both;2 reps;30 seconds   Gastroc Stretch Limitations slantboard    Other Knee/Hip Stretches hip adductor stretch standing 2x30 sec      Knee/Hip Exercises: Standing   Heel Raises 2 sets;10 reps   Heel Raises Limitations single leg    Forward Step Up 2 sets;10 reps;Left;Hand Hold: 0;Step Height: 6"   Forward Step Up Limitations x1 set of 10 on Rt    Step Down 2 sets;15 reps;Left;Hand Hold: 2;Step Height: 4"   Step Down Limitations lateral step down, x1 set on Rt     Functional Squat 2 sets;10 reps   Functional Squat Limitations BUE support   Rocker Board 2 minutes   Rocker Board Limitations Rt/Lt, A/P     Knee/Hip Exercises: Supine   Bridges with [employer] 15 reps;Both   Straight Leg Raises Left;2 sets;15 reps                  PT Short Term Goals - 08/09/16 1150      PT SHORT TERM GOAL #1  Title Pt pain in her left knee to be no greater than a 6/10 to allow pt to sleep for three hours at at time.    Baseline 08/09/2016:  Pain scale range 1-4/10 with abilty to sleep for 4-5 hours with sleeping medication   Status Achieved     PT SHORT TERM GOAL #2   Title Pt to be ambulating without an assistive device   Status Achieved     PT SHORT TERM GOAL #3   Title Pt Lt knee  extension ROM to be less than four degrees in her left knee to allow normalized gait    Baseline 08/09/2016 AROM 1-120   Status Achieved     PT SHORT TERM GOAL #4   Title Pt Lt knee flexion ROM to be to 115 to allow pt to sit for an hour without discomfort to eat a meal at a restaurant.    Baseline 08/09/2016 AROM 1-120   Status Achieved     PT SHORT TERM GOAL #5   Title Pt to be able to walk for 20 mintues without increased pain in her left knee    Status Achieved           PT Long Term Goals - 08/09/16 1153      PT LONG TERM GOAL #1    Title Pt pain to be on greater than a 2/10 to allow pt to sleep for up to 6 hours    Baseline 08/09/2016:  Pain scale range 1-4/10 with abilty to sleep for 4-5 hours with sleeping medication   Status On-going     PT LONG TERM GOAL #2   Title Pt to be sleeping in her bed again.    Baseline 08/09/2016:  Pt has returned to sleeping in bed this week   Status Achieved     PT LONG TERM GOAL #3   Title Pt strength in her Lt LE to be at least a 4+/5 in all musculature to allow pt to go up and down 10 steps in a reciprocal manner without difficulty.    Status Partially Met     PT LONG TERM GOAL #4   Title Pt to be able to ambulate for an hour without increased pain in her Lt knee to allow pt to shop in comfort    Baseline 08/09/2016: Able to ambulate an hour with increased pain and edema following   Status On-going     PT LONG TERM GOAL #5   Title Pt Lt knee ROM  edema to be decreased by 3 cm to allow improved flextion to  120 degrees to allow pt to sit for 2 hours in comfort for traveling.    Baseline 08/09/2016 Lt knee jt 46cm was 48.8 cm; RT:43.3; AROM 1-120; able to sit comfortably for 2 hours with reports of increased stiffness following 2 hours   Status Partially Met               Plan - 08/17/16 1803    Clinical Impression Statement Today's session continued with progressions of functional strengthening exercise. Pt able to perform all exercises without increase in knee pain. Unable to perform scar massage this session due to pt clothing, but encouraged her to continue with scar massage techniques she was instructed with at her last session. No updates made to [facility] at this time as they appear to continue to be addressing noted limitations. Will continue with current POC.   Rehab Potential Good   PT Frequency 3x /  week   PT Duration 6 weeks   PT Treatment/Interventions ADLs/Self Care Home Management;Electrical Stimulation;Cryotherapy;Balance training;Therapeutic exercise;Therapeutic  activities;Stair training;Gait training;Patient/family education;Manual techniques;Manual lymph drainage;Passive range of motion   PT Next Visit Plan continue wiht high volt estim for swelling.  Progress functional strengthening as ROM at 1-120 degrees following mobility exercises.     Consulted and Agree with Plan of Care Patient      Patient will benefit from skilled therapeutic intervention in order to improve the following deficits and impairments:  Abnormal gait, Decreased activity tolerance, Decreased balance, Decreased range of motion, Difficulty walking, Increased edema, Decreased strength, Pain  Visit Diagnosis: Acute pain of left knee  Generalized edema  Stiffness of left knee, not elsewhere classified  Difficulty in walking, not elsewhere classified     Problem List Patient Active Problem List   Diagnosis Date Noted  . Primary osteoarthritis of left knee 06/29/2016  . Sinusitis 11/25/2015  . Diabetes mellitus type 2, uncomplicated ([place]) 65/68/1275  . GERD (gastroesophageal reflux disease) 04/16/2015  . Constipation 04/16/2015  . Dyspepsia 04/16/2015  . Chronic venous insufficiency 02/10/2015  . Otitis, externa, infective 02/10/2015  . Osteoarthritis 02/10/2015  . Obesity 02/10/2015  . OSA (obstructive sleep apnea) 09/05/2012  . Essential hypertension, benign 12/31/2011    6:10 PM,08/17/16 Elly Modena PT, DPT [facility] [place] [address] [place], [place], [address] Phone: [phone]   Fax:  [phone]  Name: Shunda Rabadi MRN: [id number] Date of Birth: 09-23-1956

## 2016-08-18 NOTE — Telephone Encounter (Signed)
Will talk to patient about it during next office visit and update accordingly.

## 2016-08-20 ENCOUNTER — Other Ambulatory Visit: Payer: Self-pay | Admitting: Internal Medicine

## 2016-08-22 ENCOUNTER — Ambulatory Visit (HOSPITAL_COMMUNITY): Payer: Managed Care, Other (non HMO)

## 2016-08-23 ENCOUNTER — Telehealth (HOSPITAL_COMMUNITY): Payer: Self-pay | Admitting: Physical Therapy

## 2016-08-23 ENCOUNTER — Ambulatory Visit (HOSPITAL_COMMUNITY): Payer: Managed Care, Other (non HMO) | Admitting: Physical Therapy

## 2016-08-23 NOTE — Telephone Encounter (Signed)
Pt did not show for appointment.  Spoke to patient who reports she forgot her appointment.  Teena Irani, PTA/CLT [phone]

## 2016-08-29 ENCOUNTER — Ambulatory Visit (HOSPITAL_COMMUNITY): Payer: Managed Care, Other (non HMO) | Admitting: Physical Therapy

## 2016-08-29 DIAGNOSIS — M25562 Pain in left knee: Secondary | ICD-10-CM | POA: Diagnosis not present

## 2016-08-29 DIAGNOSIS — R262 Difficulty in walking, not elsewhere classified: Secondary | ICD-10-CM

## 2016-08-29 DIAGNOSIS — M25662 Stiffness of left knee, not elsewhere classified: Secondary | ICD-10-CM

## 2016-08-29 DIAGNOSIS — R601 Generalized edema: Secondary | ICD-10-CM

## 2016-08-29 NOTE — Therapy (Addendum)
[place], [place], [address] Phone: [phone]   Fax:  [phone]  Physical Therapy Treatment  Patient Details  Name: [name] MRN: [id number] Date of Birth: Dec 02, 1955 Referring Provider: Rod Can   Encounter Date: 08/29/2016      PT End of Session - 08/29/16 1100    Visit Number 10   Number of Visits 10   Date for PT Re-Evaluation 08/23/16   Authorization Type [employer] allows 20 total (9 HHPT sessions)   Authorization - Visit Number 10   Authorization - Number of Visits 10   PT Start Time 6415   PT Stop Time 1100   PT Time Calculation (min) 25 min   Activity Tolerance Patient tolerated treatment well   Behavior During Therapy [facility] for tasks assessed/performed      Past Medical History:  Diagnosis Date  . Breast cancer [facility]) 2011   right breast with axillary lymph node removal  . Diabetes mellitus, type II ([place])   . GERD (gastroesophageal reflux disease)   . Hypertension   . Osteoarthritis of both knees   . Pneumonia    in early 1980's    Past Surgical History:  Procedure Laterality Date  . ABDOMINAL HYSTERECTOMY    . APPENDECTOMY  1978  . BLADDER SUSPENSION    . KNEE ARTHROPLASTY Left 06/29/2016   Procedure: LEFT TOTAL KNEE WITH NAVIGATION;  Surgeon: Rod Can, MD;  Location: [facility];  Service: Orthopedics;  Laterality: Left;  Marland Kitchen MASTECTOMY, PARTIAL     right breast  . MENISCUS REPAIR     right knee  . OOPHORECTOMY      There were no vitals filed for this visit.      Subjective Assessment - 08/29/16 1035    Subjective Pt is still having trouble sleeping.  She is cooking, going up and down steps.     How long can you sit comfortably? hours    How long can you stand comfortably? Able to stand for 45 minutes was 30 minutes    How long can you walk comfortably? 2 hours with increased swelling folloiwng (was 45 minutes was 10-15 minutes)   Patient Stated Goals Sleep better,  get back into her bed, walk without quad cane, complete steps in a reciprocal manner and decrease pain    Currently in Pain? No/denies   Pain Onset 1 to 4 weeks ago            [facility] PT Assessment - 08/29/16 0001      Assessment   Medical Diagnosis Lt TKR   Referring Provider Rod Can    Onset Date/Surgical Date 06/29/16   Next MD Visit not scheduled    Prior Therapy acute/ HH     Observation/Other Assessments-Edema    Edema Circumferential  Lt knee joint 45.7 was  47cm;  Rt 42.3      AROM   Left Knee Extension 0  was 7   Left Knee Flexion 125  was 110     Strength   Right Hip Flexion 4+/5  was 4+/5    Right Hip Extension 5/5   Left Hip Flexion 4+/5  was 4-/5   Left Hip Extension 5/5  was 4/5    Left Hip ABduction 4/5  was 4/5    Right Knee Flexion 5/5   Right Knee Extension 5/5   Left Knee Flexion 5/5  was 4+/5    Left Knee Extension 5/5   Right Ankle Dorsiflexion  5/5   Left Ankle Dorsiflexion 5/5                     [facility] Adult PT Treatment/Exercise - 08/29/16 0001      Ambulation/Gait   Ambulation Distance (Feet) --   Gait Comments --     Knee/Hip Exercises: Supine   Quad Sets 10 reps     Knee/Hip Exercises: Sidelying   Hip ABduction Strengthening;Left;10 reps   Hip ABduction Limitations 3                PT Education - 08/29/16 1059    Education provided Yes   Education Details increase icing to help decrease swelling.  Complete ankle pumps and quad sets to decrease swelling    Person(s) Educated Patient   Methods Explanation   Comprehension Verbalized understanding          PT Short Term Goals - 08/29/16 1050      PT SHORT TERM GOAL #1   Title Pt pain in her left knee to be no greater than a 6/10 to allow pt to sleep for three hours at at time.    Baseline 08/09/2016:  Pain scale range 1-4/10 with abilty to sleep for 4-5 hours with sleeping medication   Status Achieved     PT SHORT TERM GOAL #2   Title Pt  to be ambulating without an assistive device   Status Achieved     PT SHORT TERM GOAL #3   Title Pt Lt knee  extension ROM to be less than four degrees in her left knee to allow normalized gait    Baseline 08/09/2016 AROM 1-120   Status Achieved     PT SHORT TERM GOAL #4   Title Pt Lt knee flexion ROM to be to 115 to allow pt to sit for an hour without discomfort to eat a meal at a restaurant.    Baseline 08/09/2016 AROM 1-120   Status Achieved     PT SHORT TERM GOAL #5   Title Pt to be able to walk for 20 mintues without increased pain in her left knee    Status Achieved           PT Long Term Goals - 08/29/16 1050      PT LONG TERM GOAL #1   Title Pt pain to be on greater than a 2/10 to allow pt to sleep for up to 6 hours    Baseline 08/09/2016:  Pain scale range 1-4/10 with abilty to sleep for 4-5 hours with sleeping medication   Status On-going     PT LONG TERM GOAL #2   Title Pt to be sleeping in her bed again.    Baseline 08/09/2016:  Pt has returned to sleeping in bed this week   Status Achieved     PT LONG TERM GOAL #3   Title Pt strength in her Lt LE to be at least a 4+/5 in all musculature to allow pt to go up and down 10 steps in a reciprocal manner without difficulty.    Status Achieved     PT LONG TERM GOAL #4   Title Pt to be able to ambulate for an hour without increased pain in her Lt knee to allow pt to shop in comfort    Baseline 08/09/2016: Able to ambulate an hour with increased pain and edema following   Status Achieved     PT LONG TERM GOAL #5   Title Pt Lt  knee ROM  edema to be decreased by 3 cm to allow improved flextion to  120 degrees to allow pt to sit for 2 hours in comfort for traveling.    Baseline continues to be swollen but is decreasing slowly.    Status Partially Met               Plan - 08/29/16 1101    Clinical Impression Statement Pt reassessed.  Pt ROM and strength are functional with minimal pain during the day.  Pt has  stopped icing as she was and therapist encouraged pt to continue icing as long as she has edema.    Rehab Potential Good   PT Frequency 3x / week   PT Duration 6 weeks   PT Treatment/Interventions ADLs/Self Care Home Management;Electrical Stimulation;Cryotherapy;Balance training;Therapeutic exercise;Therapeutic activities;Stair training;Gait training;Patient/family education;Manual techniques;Manual lymph drainage;Passive range of motion   PT Next Visit Plan Pt ready for discharge will continue with icing, and manual edema management at home    Consulted and Agree with Plan of Care Patient      Patient will benefit from skilled therapeutic intervention in order to improve the following deficits and impairments:  Abnormal gait, Decreased activity tolerance, Decreased balance, Decreased range of motion, Difficulty walking, Increased edema, Decreased strength, Pain  Visit Diagnosis: Acute pain of left knee - Plan: PT plan of care cert/re-cert  Generalized edema - Plan: PT plan of care cert/re-cert  Stiffness of left knee, not elsewhere classified  Difficulty in walking, not elsewhere classified     Problem List Patient Active Problem List   Diagnosis Date Noted  . Primary osteoarthritis of left knee 06/29/2016  . Sinusitis 11/25/2015  . Diabetes mellitus type 2, uncomplicated ([place]) 77/41/2878  . GERD (gastroesophageal reflux disease) 04/16/2015  . Constipation 04/16/2015  . Dyspepsia 04/16/2015  . Chronic venous insufficiency 02/10/2015  . Otitis, externa, infective 02/10/2015  . Osteoarthritis 02/10/2015  . Obesity 02/10/2015  . OSA (obstructive sleep apnea) 09/05/2012  . Essential hypertension, benign 12/31/2011    Rayetta Humphrey, PT CLT [phone] 08/29/2016, 11:03 AM  [place], [place], [address] Phone: [phone]   Fax:  [phone]  Name: [name] MRN: [id number] Date of Birth:  30-Aug-1956  PHYSICAL THERAPY DISCHARGE SUMMARY  Visits from Start of Care: 10  Current functional level related to goals / functional outcomes: See above   Remaining deficits: Sleeping still difficult   Education / Equipment: HEP Plan: Patient agrees to discharge.  Patient goals were partially met. Patient is being discharged due to being pleased with the current functional level.  ?????       Rayetta Humphrey, [place] CLT [phone]

## 2016-08-31 ENCOUNTER — Other Ambulatory Visit: Payer: Self-pay | Admitting: Internal Medicine

## 2016-08-31 DIAGNOSIS — Z853 Personal history of malignant neoplasm of breast: Secondary | ICD-10-CM

## 2016-09-14 ENCOUNTER — Other Ambulatory Visit: Payer: Self-pay | Admitting: Internal Medicine

## 2016-09-14 NOTE — Telephone Encounter (Signed)
Patients last ov was may/2016---are you ok with refilling--please advise, thanks

## 2016-09-19 ENCOUNTER — Other Ambulatory Visit: Payer: Self-pay | Admitting: Internal Medicine

## 2016-10-03 ENCOUNTER — Ambulatory Visit
Admission: RE | Admit: 2016-10-03 | Discharge: 2016-10-03 | Disposition: A | Discharge status: Home / Self Care | Payer: Managed Care, Other (non HMO) | Source: Ambulatory Visit | Attending: Internal Medicine | Admitting: Internal Medicine

## 2016-10-03 ENCOUNTER — Ambulatory Visit: Payer: Managed Care, Other (non HMO)

## 2016-10-03 ENCOUNTER — Other Ambulatory Visit: Payer: Self-pay | Admitting: Internal Medicine

## 2016-10-03 DIAGNOSIS — Z853 Personal history of malignant neoplasm of breast: Secondary | ICD-10-CM

## 2016-10-03 DIAGNOSIS — R921 Mammographic calcification found on diagnostic imaging of breast: Secondary | ICD-10-CM

## 2016-10-04 ENCOUNTER — Ambulatory Visit
Admission: RE | Admit: 2016-10-04 | Discharge: 2016-10-04 | Disposition: A | Discharge status: Home / Self Care | Payer: Managed Care, Other (non HMO) | Source: Ambulatory Visit | Attending: Internal Medicine | Admitting: Internal Medicine

## 2016-10-04 ENCOUNTER — Other Ambulatory Visit: Payer: Self-pay | Admitting: Internal Medicine

## 2016-10-04 DIAGNOSIS — R921 Mammographic calcification found on diagnostic imaging of breast: Secondary | ICD-10-CM

## 2016-10-17 ENCOUNTER — Ambulatory Visit (INDEPENDENT_AMBULATORY_CARE_PROVIDER_SITE_OTHER): Payer: Managed Care, Other (non HMO) | Admitting: Internal Medicine

## 2016-10-17 ENCOUNTER — Other Ambulatory Visit (INDEPENDENT_AMBULATORY_CARE_PROVIDER_SITE_OTHER): Payer: Managed Care, Other (non HMO)

## 2016-10-17 ENCOUNTER — Encounter: Payer: Self-pay | Admitting: Internal Medicine

## 2016-10-17 ENCOUNTER — Other Ambulatory Visit (HOSPITAL_COMMUNITY): Payer: Self-pay | Admitting: Internal Medicine

## 2016-10-17 VITALS — BP 140/90 | HR 92 | Temp 98.2°F | Resp 16 | Ht 65.0 in | Wt 199.0 lb

## 2016-10-17 DIAGNOSIS — E119 Type 2 diabetes mellitus without complications: Secondary | ICD-10-CM

## 2016-10-17 DIAGNOSIS — I10 Essential (primary) hypertension: Secondary | ICD-10-CM | POA: Diagnosis not present

## 2016-10-17 LAB — HEMOGLOBIN A1C: HEMOGLOBIN A1C: 7.1 % — AB (ref 4.6–6.5)

## 2016-10-17 MED ORDER — DICLOFENAC SODIUM 75 MG PO TBEC: 75.0000 mg | DELAYED_RELEASE_TABLET | Freq: Two times a day (BID) | ORAL | 6 refills | Status: DC | PRN

## 2016-10-17 NOTE — Patient Instructions (Signed)
Good luck with the second knee surgery.   We have refilled the diclofenac and will check the sugar numbers today.

## 2016-10-17 NOTE — Progress Notes (Signed)
Pre visit review using our clinic review tool, if applicable. No additional management support is needed unless otherwise documented below in the visit note. 

## 2016-10-17 NOTE — Progress Notes (Signed)
   Subjective:    Patient ID: [name], female    DOB: April 11, 1956, 61 y.o.   MRN: [id number]  HPI The patient is a 61 YO female coming in for follow up of her medical conditions including her diabetes (still taking her medicines, more active now that she has had her knee replaced, taking her metformin, no new complications) and her blood pressure (taking amlodipine, lasix, spironolactone, not complicated). She is still having pain in her knees and one knee replacement done and getting the next one in about 6 weeks from now.   Review of Systems  Constitutional: Positive for activity change. Negative for appetite change, chills, fatigue, fever and unexpected weight change.  Respiratory: Negative.   Cardiovascular: Negative.   Gastrointestinal: Negative.   Musculoskeletal: Positive for arthralgias. Negative for back pain, gait problem, joint swelling, myalgias and neck pain.  Skin: Negative.   Neurological: Negative.       Objective:   Physical Exam  Constitutional: She is oriented to person, place, and time. She appears well-developed and well-nourished.  HENT:  Head: Normocephalic and atraumatic.  Eyes: EOM are normal.  Cardiovascular: Normal rate and regular rhythm.   Pulmonary/Chest: Effort normal and breath sounds normal.  Abdominal: Soft. She exhibits no distension. There is no tenderness. There is no rebound.  Neurological: She is alert and oriented to person, place, and time.  Skin: Skin is warm and dry.   Vitals:   10/17/16 0817  BP: 140/90  Pulse: 92  Resp: 16  Temp: 98.2 F (36.8 C)  TempSrc: Oral  SpO2: 100%  Weight: 199 lb (90.3 kg)  Height: 5\' 5"  (1.651 m)      Assessment & Plan:

## 2016-10-17 NOTE — Assessment & Plan Note (Signed)
BP at goal on her lasix, amlodipine, spironolactone. Recent CMP okay and will have rechecked with surgery so will not get today.

## 2016-10-17 NOTE — Assessment & Plan Note (Signed)
Checking HgA1c, taking metformin alone with good control. Not complicated. Getting eye exam this week. Not exercising now and we talked about the need to stay active to help her new knee heal and stay with good ROM.

## 2016-10-31 LAB — HM DIABETES EYE EXAM

## 2016-11-01 ENCOUNTER — Ambulatory Visit: Payer: Self-pay | Admitting: Orthopedic Surgery

## 2016-11-01 ENCOUNTER — Other Ambulatory Visit: Payer: Self-pay | Admitting: Internal Medicine

## 2016-11-01 ENCOUNTER — Other Ambulatory Visit (HOSPITAL_COMMUNITY): Payer: Self-pay | Admitting: Internal Medicine

## 2016-11-09 ENCOUNTER — Encounter: Payer: Self-pay | Admitting: Internal Medicine

## 2016-11-09 NOTE — Progress Notes (Unsigned)
Results entered and sent to scan  

## 2016-11-28 ENCOUNTER — Ambulatory Visit: Payer: Self-pay | Admitting: Orthopedic Surgery

## 2016-11-28 NOTE — H&P (Signed)
TOTAL KNEE ADMISSION H&P  Patient is being admitted for right total knee arthroplasty.  Subjective:  Chief Complaint:right knee pain.  HPI: [name], 61 y.o. female, has a history of pain and functional disability in the right knee due to arthritis and has failed non-surgical conservative treatments for greater than 12 weeks to includeNSAID's and/or analgesics, corticosteriod injections, flexibility and strengthening excercises, supervised PT with diminished ADL's post treatment, use of assistive devices, weight reduction as appropriate and activity modification.  Onset of symptoms was gradual, starting 3 years ago with gradually worsening course since that time. The patient noted no past surgery on the right knee(s).  Patient currently rates pain in the left knee(s) at 10 out of 10 with activity. Patient has night pain, worsening of pain with activity and weight bearing, pain that interferes with activities of daily living, pain with passive range of motion and joint swelling.  Patient has evidence of subchondral cysts, subchondral sclerosis, periarticular osteophytes and joint space narrowing by imaging studies. There is no active infection.  Patient Active Problem List   Diagnosis Date Noted  . Primary osteoarthritis of left knee 06/29/2016  . Diabetes mellitus type 2, uncomplicated ([facility]) 06/14/2015  . GERD (gastroesophageal reflux disease) 04/16/2015  . Constipation 04/16/2015  . Chronic venous insufficiency 02/10/2015  . Osteoarthritis 02/10/2015  . Obesity 02/10/2015  . OSA (obstructive sleep apnea) 09/05/2012  . Essential hypertension, benign 12/31/2011   Past Medical History:  Diagnosis Date  . Breast cancer ([facility]) 2011   right breast with axillary lymph node removal  . Diabetes mellitus, type II ([facility])   . GERD (gastroesophageal reflux disease)   . Hypertension   . Osteoarthritis of both knees   . Pneumonia    in early 1980's    Past Surgical History:  Procedure  Laterality Date  . ABDOMINAL HYSTERECTOMY    . APPENDECTOMY  1978  . BLADDER SUSPENSION    . KNEE ARTHROPLASTY Left 06/29/2016   Procedure: LEFT TOTAL KNEE WITH NAVIGATION;  Surgeon: Tera Pellicane, MD;  Location: [facility];  Service: Orthopedics;  Laterality: Left;  . MASTECTOMY, PARTIAL     right breast  . MENISCUS REPAIR     right knee  . OOPHORECTOMY       (Not in a hospital admission) No Known Allergies  Social History  Substance Use Topics  . Smoking status: Former Smoker    Packs/day: 0.50    Years: 20.00    Types: Cigarettes    Quit date: 10/14/2013  . Smokeless tobacco: Never Used  . Alcohol use Yes     Comment: rarely    Family History  Problem Relation Age of Onset  . Adopted: Yes     Review of Systems  Constitutional: Positive for chills.  HENT: Negative.   Eyes: Negative.   Respiratory: Negative.   Cardiovascular: Negative.   Gastrointestinal: Negative.   Genitourinary: Negative.   Musculoskeletal: Positive for joint pain.  Skin: Negative.   Neurological: Negative.   Endo/Heme/Allergies: Negative.   Psychiatric/Behavioral: Negative.     Objective:  Physical Exam  Vitals reviewed. Constitutional: She is oriented to person, place, and time. She appears well-developed and well-nourished.  HENT:  Head: Normocephalic and atraumatic.  Eyes: Conjunctivae and EOM are normal. Pupils are equal, round, and reactive to light.  Neck: Normal range of motion. Neck supple.  Cardiovascular: Normal rate, regular rhythm and intact distal pulses.   Respiratory: Effort normal. No respiratory distress.  GI: Soft. She exhibits no distension.  Genitourinary:    Genitourinary Comments: deferred  Musculoskeletal:       Right knee: She exhibits decreased range of motion, swelling, effusion and abnormal alignment. Tenderness found. Medial joint line and lateral joint line tenderness noted.       Legs: Neurological: She is alert and oriented to person, place, and time. She has  normal reflexes.  Skin: Skin is warm and dry.  Psychiatric: She has a normal mood and affect. Her behavior is normal. Judgment and thought content normal.    Vital signs in last 24 hours: @VSRANGES@  Labs:   Estimated body mass index is 33.12 kg/m as calculated from the following:   Height as of 10/17/16: 5' 5" (1.651 m).   Weight as of 10/17/16: 90.3 kg (199 lb).   Imaging Review Plain radiographs demonstrate severe degenerative joint disease of the right knee(s). The overall alignment issignificant varus. The bone quality appears to be adequate for age and reported activity level.  Assessment/Plan:  End stage arthritis, right knee   The patient history, physical examination, clinical judgment of the provider and imaging studies are consistent with end stage degenerative joint disease of the right knee(s) and total knee arthroplasty is deemed medically necessary. The treatment options including medical management, injection therapy arthroscopy and arthroplasty were discussed at length. The risks and benefits of total knee arthroplasty were presented and reviewed. The risks due to aseptic loosening, infection, stiffness, patella tracking problems, thromboembolic complications and other imponderables were discussed. The patient acknowledged the explanation, agreed to proceed with the plan and consent was signed. Patient is being admitted for inpatient treatment for surgery, pain control, PT, OT, prophylactic antibiotics, VTE prophylaxis, progressive ambulation and ADL's and discharge planning. The patient is planning to be discharged home with home health services  

## 2016-12-02 ENCOUNTER — Other Ambulatory Visit (HOSPITAL_COMMUNITY): Payer: Self-pay | Admitting: Internal Medicine

## 2016-12-05 ENCOUNTER — Other Ambulatory Visit (HOSPITAL_COMMUNITY): Payer: Self-pay | Admitting: Emergency Medicine

## 2016-12-05 NOTE — Patient Instructions (Signed)
[name]  12/05/2016   Your procedure is scheduled on: 12-14-16  Report to [facility] Main  Entrance take [facility]  elevators to 3rd floor to  [place] at 1:45PM.  Call this number if you have problems the morning of surgery [phone]   Remember: ONLY 1 PERSON MAY GO WITH YOU TO SHORT STAY TO GET  READY MORNING OF [place].  Do not eat food After Midnight Wednesday night. You may have clear liquids from midnight until 945am day of surgery. Nothing by mouth after 945am!!     Take these medicines the morning of surgery with A SIP OF WATER: amlodipine(norvasc), tylenol as needed, ear drops  DO NOT TAKE ANY DIABETIC MEDICATIONS DAY OF YOUR SURGERY                               You may not have any metal on your body including hair pins and              piercings  Do not wear jewelry, make-up, lotions, powders or perfumes, deodorant             Do not wear nail polish.  Do not shave  48 hours prior to surgery.              Men may shave face and neck.   Do not bring valuables to the hospital. [place].  Contacts, dentures or bridgework may not be worn into surgery.  Leave suitcase in the car. After surgery it may be brought to your room.               Please read over the following fact sheets you were given: _____________________________________________________________________                CLEAR LIQUID DIET   Foods Allowed                                                                     Foods Excluded  Coffee and tea, regular and decaf                             liquids that you cannot  Plain Jell-O in any flavor                                             see through such as: Fruit ices (not with fruit pulp)                                     milk, soups, orange juice  Iced Popsicles  All solid food Carbonated beverages, regular and diet                                     Cranberry, grape and apple juices Sports drinks like Gatorade Lightly seasoned clear broth or consume(fat free) Sugar, honey syrup  Sample Menu Breakfast                                Lunch                                     Supper Cranberry juice                    Beef broth                            Chicken broth Jell-O                                     Grape juice                           Apple juice Coffee or tea                        Jell-O                                      Popsicle                                                Coffee or tea                        Coffee or tea  _____________________________________________________________________  How to Manage Your Diabetes Before and After Surgery  Why is it important to control my blood sugar before and after surgery? . Improving blood sugar levels before and after surgery helps healing and can limit problems. . A way of improving blood sugar control is eating a healthy diet by: o  Eating less sugar and carbohydrates o  Increasing activity/exercise o  Talking with your doctor about reaching your blood sugar goals . High blood sugars (greater than 180 mg/dL) can raise your risk of infections and slow your recovery, so you will need to focus on controlling your diabetes during the weeks before surgery. . Make sure that the doctor who takes care of your diabetes knows about your planned surgery including the date and location.  How do I manage my blood sugar before surgery? . Check your blood sugar at least 4 times a day, starting 2 days before surgery, to make sure that the level is not too high or low. o Check your blood sugar the morning of your surgery when you wake up and every 2 hours until you get to the Short Stay unit. . If your blood sugar is less  than 70 mg/dL, you will need to treat for low blood sugar: o Do not take insulin. o Treat a low blood sugar (less than 70 mg/dL) with   cup of clear juice (cranberry or apple), 4 glucose tablets, OR glucose gel. o Recheck blood sugar in 15 minutes after treatment (to make sure it is greater than 70 mg/dL). If your blood sugar is not greater than 70 mg/dL on recheck, call [phone] for further instructions. . Report your blood sugar to the short stay nurse when you get to Short Stay.  . If you are admitted to the hospital after surgery: o Your blood sugar will be checked by the staff and you will probably be given insulin after surgery (instead of oral diabetes medicines) to make sure you have good blood sugar levels. o The goal for blood sugar control after surgery is 80-180 mg/dL.   WHAT DO I DO ABOUT MY DIABETES MEDICATION?  Marland Kitchen Do not take oral diabetes medicines (pills) the morning of surgery.  . THE DAY BEFORE SURGERY 12-13-16 , take your Metformin as normal      THE MORNING OF SURGERY 12-14-16 , DO NOT TAKE METFORMIN !  Reviewed and Endorsed by [facility] Patient Education Committee, August 2015  [facility] - Preparing for Surgery Before surgery, you can play an important role.  Because skin is not sterile, your skin needs to be as free of germs as possible.  You can reduce the number of germs on your skin by washing with CHG (chlorahexidine gluconate) soap before surgery.  CHG is an antiseptic cleaner which kills germs and bonds with the skin to continue killing germs even after washing. Please DO NOT use if you have an allergy to CHG or antibacterial soaps.  If your skin becomes reddened/irritated stop using the CHG and inform your nurse when you arrive at Short Stay. Do not shave (including legs and underarms) for at least 48 hours prior to the first CHG shower.  You may shave your face/neck. Please follow these instructions carefully:  1.  Shower with CHG Soap the night before surgery and the  morning of Surgery.  2.  If you choose to wash your hair, wash your hair first as usual with your  normal  shampoo.  3.   After you shampoo, rinse your hair and body thoroughly to remove the  shampoo.                           4.  Use CHG as you would any other liquid soap.  You can apply chg directly  to the skin and wash                       Gently with a scrungie or clean washcloth.  5.  Apply the CHG Soap to your body ONLY FROM THE NECK DOWN.   Do not use on face/ open                           Wound or open sores. Avoid contact with eyes, ears mouth and genitals (private parts).                       Wash face,  Genitals (private parts) with your normal soap.             6.  Wash thoroughly, paying special attention to  the area where your surgery  will be performed.  7.  Thoroughly rinse your body with warm water from the neck down.  8.  DO NOT shower/wash with your normal soap after using and rinsing off  the CHG Soap.                9.  Pat yourself dry with a clean towel.            10.  Wear clean pajamas.            11.  Place clean sheets on your bed the night of your first shower and do not  sleep with pets. Day of Surgery : Do not apply any lotions/deodorants the morning of surgery.  Please wear clean clothes to the hospital/surgery center.  FAILURE TO FOLLOW THESE INSTRUCTIONS MAY RESULT IN THE CANCELLATION OF YOUR SURGERY PATIENT SIGNATURE_________________________________  NURSE SIGNATURE__________________________________  ________________________________________________________________________  Adam Phenix  An incentive spirometer is a tool that can help keep your lungs clear and active. This tool measures how well you are filling your lungs with each breath. Taking long deep breaths may help reverse or decrease the chance of developing breathing (pulmonary) problems (especially infection) following:  A long period of time when you are unable to move or be active. BEFORE THE PROCEDURE   If the spirometer includes an indicator to show your best effort, your nurse or respiratory  therapist will set it to a desired goal.  If possible, sit up straight or lean slightly forward. Try not to slouch.  Hold the incentive spirometer in an upright position. INSTRUCTIONS FOR USE  1. Sit on the edge of your bed if possible, or sit up as far as you can in bed or on a chair. 2. Hold the incentive spirometer in an upright position. 3. Breathe out normally. 4. Place the mouthpiece in your mouth and seal your lips tightly around it. 5. Breathe in slowly and as deeply as possible, raising the piston or the ball toward the top of the column. 6. Hold your breath for 3-5 seconds or for as long as possible. Allow the piston or ball to fall to the bottom of the column. 7. Remove the mouthpiece from your mouth and breathe out normally. 8. Rest for a few seconds and repeat Steps 1 through 7 at least 10 times every 1-2 hours when you are awake. Take your time and take a few normal breaths between deep breaths. 9. The spirometer may include an indicator to show your best effort. Use the indicator as a goal to work toward during each repetition. 10. After each set of 10 deep breaths, practice coughing to be sure your lungs are clear. If you have an incision (the cut made at the time of surgery), support your incision when coughing by placing a pillow or rolled up towels firmly against it. Once you are able to get out of bed, walk around indoors and cough well. You may stop using the incentive spirometer when instructed by your caregiver.  RISKS AND COMPLICATIONS  Take your time so you do not get dizzy or light-headed.  If you are in pain, you may need to take or ask for pain medication before doing incentive spirometry. It is harder to take a deep breath if you are having pain. AFTER USE  Rest and breathe slowly and easily.  It can be helpful to keep track of a log of your progress. Your caregiver can provide you with a simple  table to help with this. If you are using the spirometer at home,  follow these instructions: [place] IF:   You are having difficultly using the spirometer.  You have trouble using the spirometer as often as instructed.  Your pain medication is not giving enough relief while using the spirometer.  You develop fever of 100.5 F (38.1 C) or higher. SEEK IMMEDIATE MEDICAL CARE IF:   You cough up bloody sputum that had not been present before.  You develop fever of 102 F (38.9 C) or greater.  You develop worsening pain at or near the incision site. MAKE SURE YOU:   Understand these instructions.  Will watch your condition.  Will get help right away if you are not doing well or get worse. Document Released: 01/29/2007 Document Revised: 12/11/2011 Document Reviewed: 04/01/2007 ExitCare Patient Information 2014 ExitCare, [place].   ________________________________________________________________________  WHAT IS A BLOOD TRANSFUSION? Blood Transfusion Information  A transfusion is the replacement of blood or some of its parts. Blood is made up of multiple cells which provide different functions.  Red blood cells carry oxygen and are used for blood loss replacement.  White blood cells fight against infection.  Platelets control bleeding.  Plasma helps clot blood.  Other blood products are available for specialized needs, such as hemophilia or other clotting disorders. BEFORE THE TRANSFUSION  Who gives blood for transfusions?   Healthy volunteers who are fully evaluated to make sure their blood is safe. This is blood bank blood. Transfusion therapy is the safest it has ever been in the practice of medicine. Before blood is taken from a donor, a complete history is taken to make sure that person has no history of diseases nor engages in risky social behavior (examples are intravenous drug use or sexual activity with multiple partners). The donor's travel history is screened to minimize risk of transmitting infections, such as malaria.  The donated blood is tested for signs of infectious diseases, such as HIV and hepatitis. The blood is then tested to be sure it is compatible with you in order to minimize the chance of a transfusion reaction. If you or a relative donates blood, this is often done in anticipation of surgery and is not appropriate for emergency situations. It takes many days to process the donated blood. RISKS AND COMPLICATIONS Although transfusion therapy is very safe and saves many lives, the main dangers of transfusion include:   Getting an infectious disease.  Developing a transfusion reaction. This is an allergic reaction to something in the blood you were given. Every precaution is taken to prevent this. The decision to have a blood transfusion has been considered carefully by your caregiver before blood is given. Blood is not given unless the benefits outweigh the risks. AFTER THE TRANSFUSION  Right after receiving a blood transfusion, you will usually feel much better and more energetic. This is especially true if your red blood cells have gotten low (anemic). The transfusion raises the level of the red blood cells which carry oxygen, and this usually causes an energy increase.  The nurse administering the transfusion will monitor you carefully for complications. HOME CARE INSTRUCTIONS  No special instructions are needed after a transfusion. You may find your energy is better. Speak with your caregiver about any limitations on activity for underlying diseases you may have. SEEK MEDICAL CARE IF:   Your condition is not improving after your transfusion.  You develop redness or irritation at the intravenous (IV) site. [place]  CARE IF:  Any of the following symptoms occur over the next 12 hours:  Shaking chills.  You have a temperature by mouth above 102 F (38.9 C), not controlled by medicine.  Chest, back, or muscle pain.  People around you feel you are not acting correctly or are  confused.  Shortness of breath or difficulty breathing.  Dizziness and fainting.  You get a rash or develop hives.  You have a decrease in urine output.  Your urine turns a dark color or changes to pink, red, or brown. Any of the following symptoms occur over the next 10 days:  You have a temperature by mouth above 102 F (38.9 C), not controlled by medicine.  Shortness of breath.  Weakness after normal activity.  The white part of the eye turns yellow (jaundice).  You have a decrease in the amount of urine or are urinating less often.  Your urine turns a dark color or changes to pink, red, or brown. Document Released: 09/15/2000 Document Revised: 12/11/2011 Document Reviewed: 05/04/2008 [facility] Patient Information 2014 [place], [place].  _______________________________________________________________________

## 2016-12-05 NOTE — Progress Notes (Signed)
hga1c 10-17-16 epic ekg 06-20-16 epic

## 2016-12-06 ENCOUNTER — Encounter (HOSPITAL_COMMUNITY): Payer: Self-pay

## 2016-12-06 ENCOUNTER — Encounter (HOSPITAL_COMMUNITY)
Admission: RE | Admit: 2016-12-06 | Discharge: 2016-12-06 | Disposition: A | Discharge status: Home / Self Care | Payer: Managed Care, Other (non HMO) | Source: Ambulatory Visit | Attending: Orthopedic Surgery | Admitting: Orthopedic Surgery

## 2016-12-06 DIAGNOSIS — M1711 Unilateral primary osteoarthritis, right knee: Secondary | ICD-10-CM | POA: Diagnosis not present

## 2016-12-06 DIAGNOSIS — Z01812 Encounter for preprocedural laboratory examination: Secondary | ICD-10-CM | POA: Diagnosis present

## 2016-12-06 DIAGNOSIS — Z0183 Encounter for blood typing: Secondary | ICD-10-CM | POA: Insufficient documentation

## 2016-12-06 LAB — BASIC METABOLIC PANEL
Anion gap: 7 (ref 5–15)
BUN: 21 mg/dL — AB (ref 6–20)
CALCIUM: 10.2 mg/dL (ref 8.9–10.3)
CHLORIDE: 102 mmol/L (ref 101–111)
CO2: 26 mmol/L (ref 22–32)
CREATININE: 1.3 mg/dL — AB (ref 0.44–1.00)
GFR calc non Af Amer: 44 mL/min — ABNORMAL LOW (ref >60)
GFR, EST AFRICAN AMERICAN: 51 mL/min — AB (ref >60)
Glucose, Bld: 90 mg/dL (ref 65–99)
Potassium: 5.3 mmol/L — ABNORMAL HIGH (ref 3.5–5.1)
SODIUM: 135 mmol/L (ref 135–145)

## 2016-12-06 LAB — SURGICAL PCR SCREEN
MRSA, PCR: NEGATIVE
Staphylococcus aureus: NEGATIVE

## 2016-12-06 LAB — CBC
HCT: 38.3 % (ref 36.0–46.0)
Hemoglobin: 12.1 g/dL (ref 12.0–15.0)
MCH: 26.7 pg (ref 26.0–34.0)
MCHC: 31.6 g/dL (ref 30.0–36.0)
MCV: 84.4 fL (ref 78.0–100.0)
Platelets: 467 10*3/uL — ABNORMAL HIGH (ref 150–400)
RBC: 4.54 MIL/uL (ref 3.87–5.11)
RDW: 14.4 % (ref 11.5–15.5)
WBC: 6.4 10*3/uL (ref 4.0–10.5)

## 2016-12-06 LAB — TYPE AND SCREEN
ABO/RH(D): O POS
ANTIBODY SCREEN: POSITIVE
DAT, IGG: NEGATIVE

## 2016-12-06 LAB — GLUCOSE, CAPILLARY: Glucose-Capillary: 108 mg/dL — ABNORMAL HIGH (ref 65–99)

## 2016-12-14 ENCOUNTER — Encounter (HOSPITAL_COMMUNITY): Payer: Self-pay | Admitting: *Deleted

## 2016-12-14 ENCOUNTER — Inpatient Hospital Stay (HOSPITAL_COMMUNITY): Payer: Managed Care, Other (non HMO) | Admitting: Certified Registered Nurse Anesthetist

## 2016-12-14 ENCOUNTER — Observation Stay (HOSPITAL_COMMUNITY): Payer: Managed Care, Other (non HMO)

## 2016-12-14 ENCOUNTER — Observation Stay (HOSPITAL_COMMUNITY)
Admission: RE | Admit: 2016-12-14 | Discharge: 2016-12-15 | Disposition: A | Discharge status: Home Health | Payer: Managed Care, Other (non HMO) | Source: Ambulatory Visit | Attending: Orthopedic Surgery | Admitting: Orthopedic Surgery

## 2016-12-14 ENCOUNTER — Encounter (HOSPITAL_COMMUNITY)
Admission: RE | Disposition: A | Discharge status: Home Health | Payer: Self-pay | Source: Ambulatory Visit | Attending: Orthopedic Surgery

## 2016-12-14 DIAGNOSIS — Z6832 Body mass index (BMI) 32.0-32.9, adult: Secondary | ICD-10-CM | POA: Insufficient documentation

## 2016-12-14 DIAGNOSIS — Z96652 Presence of left artificial knee joint: Secondary | ICD-10-CM | POA: Insufficient documentation

## 2016-12-14 DIAGNOSIS — E1151 Type 2 diabetes mellitus with diabetic peripheral angiopathy without gangrene: Secondary | ICD-10-CM | POA: Insufficient documentation

## 2016-12-14 DIAGNOSIS — K219 Gastro-esophageal reflux disease without esophagitis: Secondary | ICD-10-CM | POA: Insufficient documentation

## 2016-12-14 DIAGNOSIS — Z7982 Long term (current) use of aspirin: Secondary | ICD-10-CM | POA: Insufficient documentation

## 2016-12-14 DIAGNOSIS — Z853 Personal history of malignant neoplasm of breast: Secondary | ICD-10-CM | POA: Insufficient documentation

## 2016-12-14 DIAGNOSIS — I10 Essential (primary) hypertension: Secondary | ICD-10-CM | POA: Diagnosis not present

## 2016-12-14 DIAGNOSIS — Z87891 Personal history of nicotine dependence: Secondary | ICD-10-CM | POA: Diagnosis not present

## 2016-12-14 DIAGNOSIS — M1711 Unilateral primary osteoarthritis, right knee: Secondary | ICD-10-CM

## 2016-12-14 DIAGNOSIS — E669 Obesity, unspecified: Secondary | ICD-10-CM | POA: Insufficient documentation

## 2016-12-14 DIAGNOSIS — G4733 Obstructive sleep apnea (adult) (pediatric): Secondary | ICD-10-CM | POA: Insufficient documentation

## 2016-12-14 DIAGNOSIS — E119 Type 2 diabetes mellitus without complications: Secondary | ICD-10-CM | POA: Insufficient documentation

## 2016-12-14 DIAGNOSIS — Z09 Encounter for follow-up examination after completed treatment for conditions other than malignant neoplasm: Secondary | ICD-10-CM

## 2016-12-14 HISTORY — PX: KNEE ARTHROPLASTY: SHX992

## 2016-12-14 LAB — GLUCOSE, CAPILLARY
Glucose-Capillary: 114 mg/dL — ABNORMAL HIGH (ref 65–99)
Glucose-Capillary: 132 mg/dL — ABNORMAL HIGH (ref 65–99)
Glucose-Capillary: 283 mg/dL — ABNORMAL HIGH (ref 65–99)

## 2016-12-14 SURGERY — ARTHROPLASTY, KNEE, TOTAL, USING IMAGELESS COMPUTER-ASSISTED NAVIGATION
Anesthesia: Spinal | Site: Knee | Laterality: Right

## 2016-12-14 MED ORDER — BUPIVACAINE-EPINEPHRINE (PF) 0.5% -1:200000 IJ SOLN
INTRAMUSCULAR | Status: DC | PRN
  Administered 2016-12-14: 25 mL via PERINEURAL

## 2016-12-14 MED ORDER — DEXMEDETOMIDINE HCL IN NACL 200 MCG/50ML IV SOLN
INTRAVENOUS | Status: AC
  Filled 2016-12-14: qty 50

## 2016-12-14 MED ORDER — KETOROLAC TROMETHAMINE 15 MG/ML IJ SOLN
7.5000 mg | Freq: Four times a day (QID) | INTRAMUSCULAR | Status: DC
  Administered 2016-12-14 – 2016-12-15 (×3): 7.5 mg via INTRAVENOUS
  Filled 2016-12-14 (×3): qty 1

## 2016-12-14 MED ORDER — MIDAZOLAM HCL 5 MG/ML IJ SOLN
1.0000 mg | INTRAMUSCULAR | Status: DC | PRN
  Administered 2016-12-14: 2 mg via INTRAVENOUS

## 2016-12-14 MED ORDER — NEOMYCIN-POLYMYXIN-HC 3.5-10000-1 OT SUSP
3.0000 [drp] | Freq: Two times a day (BID) | OTIC | Status: DC
  Administered 2016-12-15: 11:00:00 3 [drp] via OTIC
  Filled 2016-12-14: qty 10

## 2016-12-14 MED ORDER — HYDROCODONE-ACETAMINOPHEN 5-325 MG PO TABS
1.0000 | ORAL_TABLET | ORAL | Status: DC | PRN
  Administered 2016-12-14: 1 via ORAL
  Administered 2016-12-15 (×4): 2 via ORAL
  Administered 2016-12-15: 01:00:00 1 via ORAL
  Filled 2016-12-14 (×2): qty 2
  Filled 2016-12-14 (×2): qty 1
  Filled 2016-12-14 (×2): qty 2

## 2016-12-14 MED ORDER — SODIUM CHLORIDE 0.9 % IR SOLN
Status: DC | PRN
Start: 2016-12-14 — End: 2016-12-14
  Administered 2016-12-14: 1000 mL
  Administered 2016-12-14: 3000 mL
  Administered 2016-12-14: 1000 mL

## 2016-12-14 MED ORDER — MENTHOL 3 MG MT LOZG: 1.0000 | LOZENGE | OROMUCOSAL | Status: DC | PRN

## 2016-12-14 MED ORDER — KETOROLAC TROMETHAMINE 30 MG/ML IJ SOLN
INTRAMUSCULAR | Status: DC | PRN
Start: 2016-12-14 — End: 2016-12-14
  Administered 2016-12-14: 30 mg

## 2016-12-14 MED ORDER — PHENYLEPHRINE HCL 10 MG/ML IJ SOLN
INTRAMUSCULAR | Status: DC | PRN
  Administered 2016-12-14 (×2): 80 ug via INTRAVENOUS
  Administered 2016-12-14: 120 ug via INTRAVENOUS
  Administered 2016-12-14 (×2): 80 ug via INTRAVENOUS

## 2016-12-14 MED ORDER — ALUM & MAG HYDROXIDE-SIMETH 200-200-20 MG/5ML PO SUSP: 30.0000 mL | ORAL | Status: DC | PRN

## 2016-12-14 MED ORDER — SODIUM CHLORIDE 0.9 % IJ SOLN
INTRAMUSCULAR | Status: AC
  Filled 2016-12-14: qty 50

## 2016-12-14 MED ORDER — SODIUM CHLORIDE 0.9 % IV SOLN
INTRAVENOUS | Status: DC
  Administered 2016-12-14: 23:00:00 via INTRAVENOUS

## 2016-12-14 MED ORDER — FUROSEMIDE 20 MG PO TABS
20.0000 mg | ORAL_TABLET | Freq: Every day | ORAL | Status: DC
  Administered 2016-12-15: 11:00:00 20 mg via ORAL
  Filled 2016-12-14: qty 1

## 2016-12-14 MED ORDER — ISOPROPYL ALCOHOL 70 % SOLN
Status: DC | PRN
  Administered 2016-12-14: 1 via TOPICAL

## 2016-12-14 MED ORDER — NEOMYCIN-POLYMYXIN-HC 3.5-10000-1 OT SOLN
3.0000 [drp] | Freq: Two times a day (BID) | OTIC | Status: DC
  Filled 2016-12-14: qty 10

## 2016-12-14 MED ORDER — PHENYLEPHRINE HCL 10 MG/ML IJ SOLN
INTRAVENOUS | Status: DC | PRN
  Administered 2016-12-14: 30 ug/min via INTRAVENOUS

## 2016-12-14 MED ORDER — ACETAMINOPHEN 650 MG RE SUPP: 650.0000 mg | Freq: Four times a day (QID) | RECTAL | Status: DC | PRN

## 2016-12-14 MED ORDER — EPHEDRINE 5 MG/ML INJ
INTRAVENOUS | Status: AC
  Filled 2016-12-14: qty 10

## 2016-12-14 MED ORDER — SODIUM CHLORIDE 0.9 % IV SOLN: INTRAVENOUS | Status: DC

## 2016-12-14 MED ORDER — OXYCODONE HCL 5 MG PO TABS: 5.0000 mg | ORAL_TABLET | Freq: Once | ORAL | Status: DC | PRN

## 2016-12-14 MED ORDER — CEFAZOLIN SODIUM-DEXTROSE 2-4 GM/100ML-% IV SOLN
2.0000 g | Freq: Four times a day (QID) | INTRAVENOUS | Status: AC
  Administered 2016-12-14 – 2016-12-15 (×2): 2 g via INTRAVENOUS
  Filled 2016-12-14 (×2): qty 100

## 2016-12-14 MED ORDER — ONDANSETRON HCL 4 MG/2ML IJ SOLN
4.0000 mg | Freq: Four times a day (QID) | INTRAMUSCULAR | Status: DC | PRN
Start: 2016-12-14 — End: 2016-12-15

## 2016-12-14 MED ORDER — POLYETHYLENE GLYCOL 3350 17 G PO PACK: 17.0000 g | PACK | Freq: Every day | ORAL | Status: DC | PRN

## 2016-12-14 MED ORDER — DEXAMETHASONE SODIUM PHOSPHATE 10 MG/ML IJ SOLN
INTRAMUSCULAR | Status: AC
  Filled 2016-12-14: qty 1

## 2016-12-14 MED ORDER — FENTANYL CITRATE (PF) 100 MCG/2ML IJ SOLN
50.0000 ug | INTRAMUSCULAR | Status: DC | PRN
  Administered 2016-12-14: 50 ug via INTRAVENOUS

## 2016-12-14 MED ORDER — EPHEDRINE SULFATE 50 MG/ML IJ SOLN
INTRAMUSCULAR | Status: DC | PRN
  Administered 2016-12-14: 10 mg via INTRAVENOUS

## 2016-12-14 MED ORDER — METFORMIN HCL 500 MG PO TABS
500.0000 mg | ORAL_TABLET | Freq: Two times a day (BID) | ORAL | Status: DC
  Administered 2016-12-15: 08:00:00 500 mg via ORAL
  Filled 2016-12-14: qty 1

## 2016-12-14 MED ORDER — PROPOFOL 10 MG/ML IV BOLUS
INTRAVENOUS | Status: AC
  Filled 2016-12-14: qty 40

## 2016-12-14 MED ORDER — BUPIVACAINE-EPINEPHRINE 0.25% -1:200000 IJ SOLN
INTRAMUSCULAR | Status: DC | PRN
  Administered 2016-12-14: 30 mL

## 2016-12-14 MED ORDER — LIDOCAINE 2% (20 MG/ML) 5 ML SYRINGE
INTRAMUSCULAR | Status: AC
  Filled 2016-12-14: qty 5

## 2016-12-14 MED ORDER — ONDANSETRON HCL 4 MG/2ML IJ SOLN: 4.0000 mg | Freq: Four times a day (QID) | INTRAMUSCULAR | Status: DC | PRN

## 2016-12-14 MED ORDER — PROPOFOL 500 MG/50ML IV EMUL
INTRAVENOUS | Status: DC | PRN
  Administered 2016-12-14: 75 ug/kg/min via INTRAVENOUS

## 2016-12-14 MED ORDER — PROPOFOL 10 MG/ML IV BOLUS
INTRAVENOUS | Status: AC
  Filled 2016-12-14: qty 60

## 2016-12-14 MED ORDER — DIPHENHYDRAMINE HCL 12.5 MG/5ML PO ELIX
12.5000 mg | ORAL_SOLUTION | ORAL | Status: DC | PRN
  Administered 2016-12-14: 25 mg via ORAL
  Filled 2016-12-14: qty 10

## 2016-12-14 MED ORDER — DEXMEDETOMIDINE HCL 200 MCG/2ML IV SOLN
INTRAVENOUS | Status: DC | PRN
  Administered 2016-12-14: 12 ug via INTRAVENOUS

## 2016-12-14 MED ORDER — PHENOL 1.4 % MT LIQD
1.0000 | OROMUCOSAL | Status: DC | PRN
  Filled 2016-12-14: qty 177

## 2016-12-14 MED ORDER — TRANEXAMIC ACID 1000 MG/10ML IV SOLN
1000.0000 mg | INTRAVENOUS | Status: AC
  Administered 2016-12-14: 1000 mg via INTRAVENOUS
  Filled 2016-12-14: qty 10

## 2016-12-14 MED ORDER — CEFAZOLIN SODIUM-DEXTROSE 2-4 GM/100ML-% IV SOLN
2.0000 g | INTRAVENOUS | Status: AC
  Administered 2016-12-14: 2 g via INTRAVENOUS
  Filled 2016-12-14: qty 100

## 2016-12-14 MED ORDER — LIDOCAINE 2% (20 MG/ML) 5 ML SYRINGE
INTRAMUSCULAR | Status: DC | PRN
  Administered 2016-12-14: 60 mg via INTRAVENOUS

## 2016-12-14 MED ORDER — SPIRONOLACTONE 25 MG PO TABS
25.0000 mg | ORAL_TABLET | Freq: Every day | ORAL | Status: DC
  Administered 2016-12-15: 11:00:00 25 mg via ORAL
  Filled 2016-12-14: qty 1

## 2016-12-14 MED ORDER — PHENYLEPHRINE 40 MCG/ML (10ML) SYRINGE FOR IV PUSH (FOR BLOOD PRESSURE SUPPORT)
PREFILLED_SYRINGE | INTRAVENOUS | Status: AC
  Filled 2016-12-14: qty 10

## 2016-12-14 MED ORDER — ASPIRIN 81 MG PO CHEW
81.0000 mg | CHEWABLE_TABLET | Freq: Two times a day (BID) | ORAL | Status: DC
  Administered 2016-12-15: 81 mg via ORAL
  Filled 2016-12-14: qty 1

## 2016-12-14 MED ORDER — OXYCODONE HCL 5 MG/5ML PO SOLN: 5.0000 mg | Freq: Once | ORAL | Status: DC | PRN

## 2016-12-14 MED ORDER — CHLORHEXIDINE GLUCONATE 4 % EX LIQD
60.0000 mL | Freq: Once | CUTANEOUS | Status: AC
  Administered 2016-12-14: 4 via TOPICAL

## 2016-12-14 MED ORDER — FENTANYL CITRATE (PF) 100 MCG/2ML IJ SOLN
INTRAMUSCULAR | Status: AC
  Filled 2016-12-14: qty 2

## 2016-12-14 MED ORDER — PROPOFOL 10 MG/ML IV BOLUS
INTRAVENOUS | Status: DC | PRN
  Administered 2016-12-14: 30 mg via INTRAVENOUS

## 2016-12-14 MED ORDER — DEXTROSE 5 % IV SOLN
500.0000 mg | Freq: Four times a day (QID) | INTRAVENOUS | Status: DC | PRN
  Filled 2016-12-14: qty 5

## 2016-12-14 MED ORDER — METHOCARBAMOL 500 MG PO TABS
500.0000 mg | ORAL_TABLET | Freq: Four times a day (QID) | ORAL | Status: DC | PRN
  Administered 2016-12-14 – 2016-12-15 (×2): 500 mg via ORAL
  Filled 2016-12-14 (×2): qty 1

## 2016-12-14 MED ORDER — MIDAZOLAM HCL 2 MG/2ML IJ SOLN
INTRAMUSCULAR | Status: AC
  Filled 2016-12-14: qty 2

## 2016-12-14 MED ORDER — MIDAZOLAM HCL 2 MG/2ML IJ SOLN: 1.0000 mg | INTRAMUSCULAR | Status: DC | PRN

## 2016-12-14 MED ORDER — SENNA 8.6 MG PO TABS
2.0000 | ORAL_TABLET | Freq: Every day | ORAL | Status: DC
  Administered 2016-12-14: 23:00:00 17.2 mg via ORAL
  Filled 2016-12-14: qty 2

## 2016-12-14 MED ORDER — KETOROLAC TROMETHAMINE 30 MG/ML IJ SOLN
INTRAMUSCULAR | Status: AC
  Filled 2016-12-14: qty 1

## 2016-12-14 MED ORDER — DEXAMETHASONE SODIUM PHOSPHATE 10 MG/ML IJ SOLN: 10.0000 mg | Freq: Once | INTRAMUSCULAR | Status: DC

## 2016-12-14 MED ORDER — ONDANSETRON HCL 4 MG/2ML IJ SOLN
INTRAMUSCULAR | Status: DC | PRN
  Administered 2016-12-14: 4 mg via INTRAVENOUS

## 2016-12-14 MED ORDER — POVIDONE-IODINE 10 % EX SWAB: 2.0000 "application " | Freq: Once | CUTANEOUS | Status: DC

## 2016-12-14 MED ORDER — LACTATED RINGERS IV SOLN
INTRAVENOUS | Status: DC
  Administered 2016-12-14 (×3): via INTRAVENOUS

## 2016-12-14 MED ORDER — ONDANSETRON HCL 4 MG/2ML IJ SOLN
INTRAMUSCULAR | Status: AC
  Filled 2016-12-14: qty 2

## 2016-12-14 MED ORDER — TRANEXAMIC ACID 1000 MG/10ML IV SOLN
1000.0000 mg | Freq: Once | INTRAVENOUS | Status: AC
  Administered 2016-12-14: 1000 mg via INTRAVENOUS
  Filled 2016-12-14: qty 1100

## 2016-12-14 MED ORDER — ONDANSETRON HCL 4 MG PO TABS: 4.0000 mg | ORAL_TABLET | Freq: Four times a day (QID) | ORAL | Status: DC | PRN

## 2016-12-14 MED ORDER — AMLODIPINE BESYLATE 10 MG PO TABS
10.0000 mg | ORAL_TABLET | Freq: Every day | ORAL | Status: DC
  Administered 2016-12-15: 10 mg via ORAL
  Filled 2016-12-14: qty 1

## 2016-12-14 MED ORDER — BUPIVACAINE-EPINEPHRINE 0.25% -1:200000 IJ SOLN
INTRAMUSCULAR | Status: AC
  Filled 2016-12-14: qty 1

## 2016-12-14 MED ORDER — METOCLOPRAMIDE HCL 5 MG PO TABS: 5.0000 mg | ORAL_TABLET | Freq: Three times a day (TID) | ORAL | Status: DC | PRN

## 2016-12-14 MED ORDER — DOCUSATE SODIUM 100 MG PO CAPS
100.0000 mg | ORAL_CAPSULE | Freq: Two times a day (BID) | ORAL | Status: DC
  Administered 2016-12-14 – 2016-12-15 (×2): 100 mg via ORAL
  Filled 2016-12-14 (×2): qty 1

## 2016-12-14 MED ORDER — MIDAZOLAM HCL 2 MG/2ML IJ SOLN
INTRAMUSCULAR | Status: DC | PRN
  Administered 2016-12-14: 2 mg via INTRAVENOUS

## 2016-12-14 MED ORDER — METOCLOPRAMIDE HCL 5 MG/ML IJ SOLN: 5.0000 mg | Freq: Three times a day (TID) | INTRAMUSCULAR | Status: DC | PRN

## 2016-12-14 MED ORDER — PHENYLEPHRINE HCL 10 MG/ML IJ SOLN
INTRAMUSCULAR | Status: AC
  Filled 2016-12-14: qty 6

## 2016-12-14 MED ORDER — DEXAMETHASONE SODIUM PHOSPHATE 10 MG/ML IJ SOLN
INTRAMUSCULAR | Status: DC | PRN
  Administered 2016-12-14: 10 mg via INTRAVENOUS

## 2016-12-14 MED ORDER — ACETAMINOPHEN 325 MG PO TABS: 650.0000 mg | ORAL_TABLET | Freq: Four times a day (QID) | ORAL | Status: DC | PRN

## 2016-12-14 MED ORDER — BUPIVACAINE HCL (PF) 0.5 % IJ SOLN
INTRAMUSCULAR | Status: DC | PRN
  Administered 2016-12-14: 3 mL via INTRATHECAL

## 2016-12-14 MED ORDER — ACETAMINOPHEN 10 MG/ML IV SOLN
1000.0000 mg | INTRAVENOUS | Status: AC
  Administered 2016-12-14: 1000 mg via INTRAVENOUS
  Filled 2016-12-14: qty 100

## 2016-12-14 MED ORDER — HYDROMORPHONE HCL 1 MG/ML IJ SOLN: 0.2500 mg | INTRAMUSCULAR | Status: DC | PRN

## 2016-12-14 MED ORDER — SODIUM CHLORIDE 0.9 % IJ SOLN
INTRAMUSCULAR | Status: DC | PRN
  Administered 2016-12-14: 30 mL

## 2016-12-14 SURGICAL SUPPLY — 59 items
ADH SKN CLS APL DERMABOND .7 (GAUZE/BANDAGES/DRESSINGS) ×2
BAG SPEC THK2 15X12 ZIP CLS (MISCELLANEOUS)
BAG ZIPLOCK 12X15 (MISCELLANEOUS) IMPLANT
BANDAGE ACE 4X5 VEL STRL LF (GAUZE/BANDAGES/DRESSINGS) ×3 IMPLANT
BANDAGE ACE 6X5 VEL STRL LF (GAUZE/BANDAGES/DRESSINGS) ×3 IMPLANT
BANDAGE ELASTIC 4 VELCRO ST LF (GAUZE/BANDAGES/DRESSINGS) ×2 IMPLANT
BANDAGE ELASTIC 6 VELCRO ST LF (GAUZE/BANDAGES/DRESSINGS) ×2 IMPLANT
BLADE SAW RECIPROCATING 77.5 (BLADE) ×3 IMPLANT
CAPT KNEE TRIATH TK-4 ×2 IMPLANT
CHLORAPREP W/TINT 26ML (MISCELLANEOUS) ×6 IMPLANT
CUFF TOURN SGL QUICK 34 (TOURNIQUET CUFF) ×3
CUFF TRNQT CYL 34X4X40X1 (TOURNIQUET CUFF) ×1 IMPLANT
DECANTER SPIKE VIAL GLASS SM (MISCELLANEOUS) ×6 IMPLANT
DERMABOND ADVANCED (GAUZE/BANDAGES/DRESSINGS) ×4
DERMABOND ADVANCED .7 DNX12 (GAUZE/BANDAGES/DRESSINGS) ×2 IMPLANT
DRAPE SHEET LG 3/4 BI-LAMINATE (DRAPES) ×6 IMPLANT
DRAPE U-SHAPE 47X51 STRL (DRAPES) ×3 IMPLANT
DRSG AQUACEL AG ADV 3.5X10 (GAUZE/BANDAGES/DRESSINGS) ×3 IMPLANT
DRSG TEGADERM 4X4.75 (GAUZE/BANDAGES/DRESSINGS) ×2 IMPLANT
ELECT BLADE TIP CTD 4 INCH (ELECTRODE) ×3 IMPLANT
ELECT REM PT RETURN 9FT ADLT (ELECTROSURGICAL) ×3
ELECTRODE REM PT RTRN 9FT ADLT (ELECTROSURGICAL) ×1 IMPLANT
EVACUATOR 1/8 PVC DRAIN (DRAIN) IMPLANT
GAUZE SPONGE 4X4 12PLY STRL (GAUZE/BANDAGES/DRESSINGS) ×3 IMPLANT
GLOVE BIO SURGEON STRL SZ8.5 (GLOVE) ×6 IMPLANT
GLOVE BIOGEL PI IND STRL 8.5 (GLOVE) ×1 IMPLANT
GLOVE BIOGEL PI INDICATOR 8.5 (GLOVE) ×2
GOWN SPEC L3 XXLG W/TWL (GOWN DISPOSABLE) ×3 IMPLANT
HANDPIECE INTERPULSE COAX TIP (DISPOSABLE) ×3
HOOD PEEL AWAY FLYTE STAYCOOL (MISCELLANEOUS) ×6 IMPLANT
MARKER SKIN DUAL TIP RULER LAB (MISCELLANEOUS) ×3 IMPLANT
NDL SPNL 18GX3.5 QUINCKE PK (NEEDLE) ×1 IMPLANT
NEEDLE SPNL 18GX3.5 QUINCKE PK (NEEDLE) ×3 IMPLANT
NS IRRIG 1000ML POUR BTL (IV SOLUTION) ×3 IMPLANT
PACK TOTAL KNEE CUSTOM (KITS) ×3 IMPLANT
PADDING CAST COTTON 6X4 STRL (CAST SUPPLIES) ×3 IMPLANT
POSITIONER SURGICAL ARM (MISCELLANEOUS) ×3 IMPLANT
SAW OSC TIP CART 19.5X105X1.3 (SAW) ×3 IMPLANT
SEALER BIPOLAR AQUA 6.0 (INSTRUMENTS) ×3 IMPLANT
SET HNDPC FAN SPRY TIP SCT (DISPOSABLE) ×1 IMPLANT
SET PAD KNEE POSITIONER (MISCELLANEOUS) ×3 IMPLANT
SPONGE DRAIN TRACH 4X4 STRL 2S (GAUZE/BANDAGES/DRESSINGS) ×2 IMPLANT
SPONGE LAP 18X18 X RAY DECT (DISPOSABLE) IMPLANT
SUCTION FRAZIER HANDLE 12FR (TUBING) ×2
SUCTION TUBE FRAZIER 12FR DISP (TUBING) ×1 IMPLANT
SUT MNCRL AB 3-0 PS2 18 (SUTURE) ×3 IMPLANT
SUT MON AB 2-0 CT1 36 (SUTURE) ×6 IMPLANT
SUT STRATAFIX PDO 1 14 VIOLET (SUTURE) ×3
SUT STRATFX PDO 1 14 VIOLET (SUTURE) ×1
SUT VIC AB 1 CT1 36 (SUTURE) ×9 IMPLANT
SUT VIC AB 2-0 CT1 27 (SUTURE) ×3
SUT VIC AB 2-0 CT1 TAPERPNT 27 (SUTURE) ×1 IMPLANT
SUTURE STRATFX PDO 1 14 VIOLET (SUTURE) ×1 IMPLANT
SYR 50ML LL SCALE MARK (SYRINGE) ×3 IMPLANT
TOWER CARTRIDGE SMART MIX (DISPOSABLE) IMPLANT
TRAY FOLEY W/METER SILVER 16FR (SET/KITS/TRAYS/PACK) IMPLANT
WATER STERILE IRR 1000ML POUR (IV SOLUTION) ×6 IMPLANT
WRAP KNEE MAXI GEL POST OP (GAUZE/BANDAGES/DRESSINGS) ×3 IMPLANT
YANKAUER SUCT BULB TIP 10FT TU (MISCELLANEOUS) ×3 IMPLANT

## 2016-12-14 NOTE — H&P (View-Only) (Signed)
TOTAL KNEE ADMISSION H&P  Patient is being admitted for right total knee arthroplasty.  Subjective:  Chief Complaint:right knee pain.  HPI: [name], 61 y.o. female, has a history of pain and functional disability in the right knee due to arthritis and has failed non-surgical conservative treatments for greater than 12 weeks to includeNSAID's and/or analgesics, corticosteriod injections, flexibility and strengthening excercises, supervised PT with diminished ADL's post treatment, use of assistive devices, weight reduction as appropriate and activity modification.  Onset of symptoms was gradual, starting 3 years ago with gradually worsening course since that time. The patient noted no past surgery on the right knee(s).  Patient currently rates pain in the left knee(s) at 10 out of 10 with activity. Patient has night pain, worsening of pain with activity and weight bearing, pain that interferes with activities of daily living, pain with passive range of motion and joint swelling.  Patient has evidence of subchondral cysts, subchondral sclerosis, periarticular osteophytes and joint space narrowing by imaging studies. There is no active infection.  Patient Active Problem List   Diagnosis Date Noted  . Primary osteoarthritis of left knee 06/29/2016  . Diabetes mellitus type 2, uncomplicated ([place]) 25/42/7062  . GERD (gastroesophageal reflux disease) 04/16/2015  . Constipation 04/16/2015  . Chronic venous insufficiency 02/10/2015  . Osteoarthritis 02/10/2015  . Obesity 02/10/2015  . OSA (obstructive sleep apnea) 09/05/2012  . Essential hypertension, benign 12/31/2011   Past Medical History:  Diagnosis Date  . Breast cancer [facility]) 2011   right breast with axillary lymph node removal  . Diabetes mellitus, type II ([place])   . GERD (gastroesophageal reflux disease)   . Hypertension   . Osteoarthritis of both knees   . Pneumonia    in early 1980's    Past Surgical History:  Procedure  Laterality Date  . ABDOMINAL HYSTERECTOMY    . APPENDECTOMY  1978  . BLADDER SUSPENSION    . KNEE ARTHROPLASTY Left 06/29/2016   Procedure: LEFT TOTAL KNEE WITH NAVIGATION;  Surgeon: Rod Can, MD;  Location: [facility] ORS;  Service: Orthopedics;  Laterality: Left;  Marland Kitchen MASTECTOMY, PARTIAL     right breast  . MENISCUS REPAIR     right knee  . OOPHORECTOMY       (Not in a hospital admission) No Known Allergies  Social History  Substance Use Topics  . Smoking status: Former Smoker    Packs/day: 0.50    Years: 20.00    Types: Cigarettes    Quit date: 10/14/2013  . Smokeless tobacco: Never Used  . Alcohol use Yes     Comment: rarely    Family History  Problem Relation Age of Onset  . Adopted: Yes     Review of Systems  Constitutional: Positive for chills.  HENT: Negative.   Eyes: Negative.   Respiratory: Negative.   Cardiovascular: Negative.   Gastrointestinal: Negative.   Genitourinary: Negative.   Musculoskeletal: Positive for joint pain.  Skin: Negative.   Neurological: Negative.   Endo/Heme/Allergies: Negative.   Psychiatric/Behavioral: Negative.     Objective:  Physical Exam  Vitals reviewed. Constitutional: She is oriented to person, place, and time. She appears well-developed and well-nourished.  HENT:  Head: Normocephalic and atraumatic.  Eyes: Conjunctivae and EOM are normal. Pupils are equal, round, and reactive to light.  Neck: Normal range of motion. Neck supple.  Cardiovascular: Normal rate, regular rhythm and intact distal pulses.   Respiratory: Effort normal. No respiratory distress.  GI: Soft. She exhibits no distension.  Genitourinary:  Genitourinary Comments: deferred  Musculoskeletal:       Right knee: She exhibits decreased range of motion, swelling, effusion and abnormal alignment. Tenderness found. Medial joint line and lateral joint line tenderness noted.       Legs: Neurological: She is alert and oriented to person, place, and time. She has  normal reflexes.  Skin: Skin is warm and dry.  Psychiatric: She has a normal mood and affect. Her behavior is normal. Judgment and thought content normal.    Vital signs in last 24 hours: @VSRANGES @  Labs:   Estimated body mass index is 33.12 kg/m as calculated from the following:   Height as of 10/17/16: 5\' 5"  (1.651 m).   Weight as of 10/17/16: 90.3 kg (199 lb).   Imaging Review Plain radiographs demonstrate severe degenerative joint disease of the right knee(s). The overall alignment issignificant varus. The bone quality appears to be adequate for age and reported activity level.  Assessment/Plan:  End stage arthritis, right knee   The patient history, physical examination, clinical judgment of the provider and imaging studies are consistent with end stage degenerative joint disease of the right knee(s) and total knee arthroplasty is deemed medically necessary. The treatment options including medical management, injection therapy arthroscopy and arthroplasty were discussed at length. The risks and benefits of total knee arthroplasty were presented and reviewed. The risks due to aseptic loosening, infection, stiffness, patella tracking problems, thromboembolic complications and other imponderables were discussed. The patient acknowledged the explanation, agreed to proceed with the plan and consent was signed. Patient is being admitted for inpatient treatment for surgery, pain control, PT, OT, prophylactic antibiotics, VTE prophylaxis, progressive ambulation and ADL's and discharge planning. The patient is planning to be discharged home with home health services

## 2016-12-14 NOTE — Anesthesia Procedure Notes (Signed)
Anesthesia Regional Block: Adductor canal block   Pre-Anesthetic Checklist: ,, timeout performed, Correct Patient, Correct Site, Correct Laterality, Correct Procedure, Correct Position, site marked, Risks and benefits discussed,  Surgical consent,  Pre-op evaluation,  At surgeon's request and post-op pain management  Laterality: Right  Prep: chloraprep       Needles:  Injection technique: Single-shot  Needle Type: Echogenic Needle     Needle Length: 9cm  Needle Gauge: 21     Additional Needles:   Procedures: ultrasound guided,,,,,,,,  Narrative:  Start time: 12/14/2016 3:18 PM End time: 12/14/2016 3:25 PM Injection made incrementally with aspirations every 5 mL.  Performed by: Personally  Anesthesiologist: Jashayla Glatfelter  Additional Notes: Pt tolerated the procedure well.

## 2016-12-14 NOTE — Anesthesia Procedure Notes (Signed)
Spinal  Patient location during procedure: OR Start time: 12/14/2016 4:40 PM End time: 12/14/2016 4:48 PM Staffing Anesthesiologist: Marcie Bal, Hazleigh Mccleave Performed: anesthesiologist  Preanesthetic Checklist Completed: patient identified, surgical consent, pre-op evaluation, timeout performed, IV checked, risks and benefits discussed and monitors and equipment checked Spinal Block Patient position: sitting Prep: DuraPrep Patient monitoring: cardiac monitor, continuous pulse ox and blood pressure Approach: midline Location: L3-4 Injection technique: single-shot Needle Needle type: Pencan  Needle gauge: 24 G Needle length: 9 cm Assessment Sensory level: T10 Additional Notes Functioning IV was confirmed and monitors were applied. Sterile prep and drape, including hand hygiene and sterile gloves were used. The patient was positioned and the spine was prepped. The skin was anesthetized with lidocaine.  Free flow of clear CSF was obtained prior to injecting local anesthetic into the CSF.  The spinal needle aspirated freely following injection.  The needle was carefully withdrawn.  The patient tolerated the procedure well.

## 2016-12-14 NOTE — Anesthesia Preprocedure Evaluation (Signed)
Anesthesia Evaluation  Patient identified by MRN, date of birth, ID band Patient awake    Reviewed: Allergy & Precautions, H&P , NPO status , Patient's Chart, lab work & pertinent test results  Airway Mallampati: II   Neck ROM: full    Dental   Pulmonary sleep apnea , former smoker,    breath sounds clear to auscultation       Cardiovascular hypertension, + Peripheral Vascular Disease   Rhythm:regular Rate:Normal     Neuro/Psych    GI/Hepatic GERD  ,  Endo/Other  diabetes, Type 2obese  Renal/GU      Musculoskeletal  (+) Arthritis ,   Abdominal   Peds  Hematology   Anesthesia Other Findings   Reproductive/Obstetrics                             Anesthesia Physical Anesthesia Plan  ASA: II  Anesthesia Plan: Spinal   Post-op Pain Management:  Regional for Post-op pain   Induction: Intravenous  Airway Management Planned: Simple Face Mask  Additional Equipment:   Intra-op Plan:   Post-operative Plan:   Informed Consent: I have reviewed the patients History and Physical, chart, labs and discussed the procedure including the risks, benefits and alternatives for the proposed anesthesia with the patient or authorized representative who has indicated his/her understanding and acceptance.     Plan Discussed with: CRNA, Anesthesiologist and Surgeon  Anesthesia Plan Comments:         Anesthesia Quick Evaluation

## 2016-12-14 NOTE — Progress Notes (Signed)
Assisted Dr. Hodierne with right, ultrasound guided, adductor canal block. Side rails up, monitors on throughout procedure. See vital signs in flow sheet. Tolerated Procedure well.  

## 2016-12-14 NOTE — Discharge Instructions (Signed)
° °Dr. Shereen Marton °Total Joint Specialist °[facility] °[address] °[place], [place] [address] °[phone] ° °TOTAL KNEE REPLACEMENT POSTOPERATIVE DIRECTIONS ° ° ° °Knee Rehabilitation, Guidelines Following Surgery  °Results after knee surgery are often greatly improved when you follow the exercise, range of motion and muscle strengthening exercises prescribed by your doctor. Safety measures are also important to protect the knee from further injury. Any time any of these exercises cause you to have increased pain or swelling in your knee joint, decrease the amount until you are comfortable again and slowly increase them. If you have problems or questions, call your caregiver or physical therapist for advice.  ° °WEIGHT BEARING °Weight bearing as tolerated with assist device (walker, cane, etc) as directed, use it as long as suggested by your surgeon or therapist, typically at least 4-6 weeks. ° °HOME CARE INSTRUCTIONS  °Remove items at home which could result in a fall. This includes throw rugs or furniture in walking pathways.  °Continue medications as instructed at time of discharge. °You may have some home medications which will be placed on hold until you complete the course of blood thinner medication.  °You may start showering once you are discharged home but do not submerge the incision under water. Just pat the incision dry and apply a dry gauze dressing on daily. °Walk with walker as instructed.  °You may resume a sexual relationship in one month or when given the OK by your doctor.  °· Use walker as long as suggested by your caregivers. °· Avoid periods of inactivity such as sitting longer than an hour when not asleep. This helps prevent blood clots.  °You may put full weight on your legs and walk as much as is comfortable.  °You may return to work once you are cleared by your doctor.  °Do not drive a car for 6 weeks or until released by you surgeon.  °· Do not drive  while taking narcotics.  °Wear the elastic stockings for three weeks following surgery during the day but you may remove then at night. °Make sure you keep all of your appointments after your operation with all of your doctors and caregivers. You should call the office at the above phone number and make an appointment for approximately two weeks after the date of your surgery. °Do not remove your surgical dressing. The dressing is waterproof; you may take showers in 3 days, but do not take tub baths or submerge the dressing. °Please pick up a stool softener and laxative for home use as long as you are requiring pain medications. °· ICE to the affected knee every three hours for 30 minutes at a time and then as needed for pain and swelling.  Continue to use ice on the knee for pain and swelling from surgery. You may notice swelling that will progress down to the foot and ankle.  This is normal after surgery.  Elevate the leg when you are not up walking on it.   °It is important for you to complete the blood thinner medication as prescribed by your doctor. °· Continue to use the breathing machine which will help keep your temperature down.  It is common for your temperature to cycle up and down following surgery, especially at night when you are not up moving around and exerting yourself.  The breathing machine keeps your lungs expanded and your temperature down. ° °RANGE OF MOTION AND STRENGTHENING EXERCISES  °Rehabilitation of the knee is important following   a knee injury or an operation. After just a few days of immobilization, the muscles of the thigh which control the knee become weakened and shrink (atrophy). Knee exercises are designed to build up the tone and strength of the thigh muscles and to improve knee motion. Often times heat used for twenty to thirty minutes before working out will loosen up your tissues and help with improving the range of motion but do not use heat for the first two weeks following  surgery. These exercises can be done on a training (exercise) mat, on the floor, on a table or on a bed. Use what ever works the best and is most comfortable for you Knee exercises include:  °Leg Lifts - While your knee is still immobilized in a splint or cast, you can do straight leg raises. Lift the leg to 60 degrees, hold for 3 sec, and slowly lower the leg. Repeat 10-20 times 2-3 times daily. Perform this exercise against resistance later as your knee gets better.  °Quad and Hamstring Sets - Tighten up the muscle on the front of the thigh (Quad) and hold for 5-10 sec. Repeat this 10-20 times hourly. Hamstring sets are done by pushing the foot backward against an object and holding for 5-10 sec. Repeat as with quad sets.  °A rehabilitation program following serious knee injuries can speed recovery and prevent re-injury in the future due to weakened muscles. Contact your doctor or a physical therapist for more information on knee rehabilitation.  ° °SKILLED REHAB INSTRUCTIONS: °If the patient is transferred to a skilled rehab facility following release from the hospital, a list of the current medications will be sent to the facility for the patient to continue.  When discharged from the skilled rehab facility, please have the facility set up the patient's Home Health Physical Therapy prior to being released. Also, the skilled facility will be responsible for providing the patient with their medications at time of release from the facility to include their pain medication, the muscle relaxants, and their blood thinner medication. If the patient is still at the rehab facility at time of the two week follow up appointment, the skilled rehab facility will also need to assist the patient in arranging follow up appointment in our office and any transportation needs. ° °MAKE SURE YOU:  °Understand these instructions.  °Will watch your condition.  °Will get help right away if you are not doing well or get worse.   ° ° °Pick up stool softner and laxative for home use following surgery while on pain medications. °Do NOT remove your dressing. You may shower.  °Do not take tub baths or submerge incision under water. °May shower starting three days after surgery. °Please use a clean towel to pat the incision dry following showers. °Continue to use ice for pain and swelling after surgery. °Do not use any lotions or creams on the incision until instructed by your surgeon. ° °

## 2016-12-14 NOTE — Op Note (Signed)
OPERATIVE REPORT  SURGEON: Rod Can, MD   ASSISTANT: Staff  PREOPERATIVE DIAGNOSIS: Right knee arthritis.   POSTOPERATIVE DIAGNOSIS: Right knee arthritis.   PROCEDURE: Right total knee arthroplasty.   IMPLANTS: Stryker Triathlon CR femur, size 6. Stryker Tritanium tibia, size 5. X3 polyethelyene insert, size 9 mm, CS. 3 button asymmetric patella, size 38 mm.  ANESTHESIA:  Spinal  TOURNIQUET TIME: Not utilized.   ESTIMATED BLOOD LOSS:-* No blood loss amount entered *    ANTIBIOTICS: 2 g Ancef.  DRAINS: None.  COMPLICATIONS: None   CONDITION: PACU - hemodynamically stable.   BRIEF CLINICAL NOTE: [name] is a 61 y.o. female with a long-standing history of Right knee arthritis. After failing conservative management, the patient was indicated for total knee arthroplasty. The risks, benefits, and alternatives to the procedure were explained, and the patient elected to proceed.  PROCEDURE IN DETAIL: The surgical site was marked in the pre-op holding area. Once inside the operative room, spinal anesthesia was obtained, and a foley catheter was inserted. The patient was then positioned, a nonsterile tourniquet was placed, and the lower extremity was prepped and draped in the normal sterile surgical fashion. A time-out was called verifying side and site of surgery. The patient received IV antibiotics within 60 minutes of beginning the procedure. The tourniquet was not utilized.  An anterior approach to the knee was performed utilizing a medial parapatellar arthrotomy. A medial release was performed and the patellar fat pad was excised. Stryker navigation was used to cut the distal femur perpendicular to the mechanical axis. A freehand patellar resection was performed, and the patella was sized an prepared with 3 lug holes.  Nagivation was used to make a neutral proximal  tibia resection, taking 8 mm of bone from the less affected lateral side with 3 degrees of slope. The menisci were excised. A spacer block was placed, and the alignment and balance in extension were confirmed.   The distal femur was sized using the 3-degree external rotation guide referencing the posterior femoral cortex. The appropriate 4-in-1 cutting block was pinned into place. Rotation was checked using Whiteside's line, the epicondylar axis, and then confirmed with a spacer block in flexion. The remaining femoral cuts were performed, taking care to protect the MCL.  The tibia was sized and the trial tray was pinned into place. The remaining trail components were inserted. The knee was stable to varus and valgus stress through a full range of motion. The patella tracked centrally, and the PCL was well balanced. The trial components were removed, and the proximal tibial surface was prepared. Final components were impacted into place. The knee was tested for a final time and found to be well balanced.  The wound was copiously irrigated with a dilute betadine solution followed by normal saline with pulse lavage. Marcaine solution was injected into the periarticular soft tissue. The wound was closed in layers using #1 Vicryl and V-Loc for the fascia, 2-0 Vicryl for the subcutaneous fat, 2-0 Monocryl for the deep dermal layer, 3-0 running Monocryl subcuticular Stitch, and Dermabond for the skin. Once the glue was fully dried, an Aquacell Ag and compressive dressing were applied. Tthe patient was transported to the recovery room in stable condition. Sponge, needle, and instrument counts were correct at the end of the case x2. The patient tolerated the procedure well and there were no known complications.

## 2016-12-14 NOTE — Transfer of Care (Signed)
Immediate Anesthesia Transfer of Care Note  Patient: [name]  Procedure(s) Performed: Procedure(s) with comments: RIGHT TOTAL KNEE ARTHROPLASTY WITH COMPUTER NAVIGATION (Right) - Needs RNFA  Patient Location: PACU  Anesthesia Type:Spinal  Level of Consciousness:  sedated, patient cooperative and responds to stimulation  Airway & Oxygen Therapy:Patient Spontanous Breathing and Patient connected to face mask oxgen  Post-op Assessment:  Report given to PACU RN and Post -op Vital signs reviewed and stable  Post vital signs:  Reviewed and stable  Last Vitals:  Vitals:   12/14/16 1540 12/14/16 1541  BP: 107/73   Pulse: 77 82  Resp: 17 17  Temp:      Complications: No apparent anesthesia complications

## 2016-12-14 NOTE — Interval H&P Note (Signed)
History and Physical Interval Note:  12/14/2016 3:10 PM  [name]  has presented today for surgery, with the diagnosis of Degenerative joint disease right knee  The various methods of treatment have been discussed with the patient and family. After consideration of risks, benefits and other options for treatment, the patient has consented to  Procedure(s) with comments: RIGHT TOTAL KNEE ARTHROPLASTY WITH COMPUTER NAVIGATION (Right) - Needs RNFA as a surgical intervention .  The patient's history has been reviewed, patient examined, no change in status, stable for surgery.  I have reviewed the patient's chart and labs.  Questions were answered to the patient's satisfaction.     Avyn Aden, Horald Pollen

## 2016-12-15 ENCOUNTER — Encounter (HOSPITAL_COMMUNITY): Payer: Self-pay | Admitting: Orthopedic Surgery

## 2016-12-15 DIAGNOSIS — M1711 Unilateral primary osteoarthritis, right knee: Secondary | ICD-10-CM | POA: Diagnosis not present

## 2016-12-15 LAB — BASIC METABOLIC PANEL
ANION GAP: 7 (ref 5–15)
BUN: 19 mg/dL (ref 6–20)
CALCIUM: 9.5 mg/dL (ref 8.9–10.3)
CO2: 21 mmol/L — ABNORMAL LOW (ref 22–32)
Chloride: 106 mmol/L (ref 101–111)
Creatinine, Ser: 1.07 mg/dL — ABNORMAL HIGH (ref 0.44–1.00)
GFR calc Af Amer: >60 mL/min (ref >60)
GFR, EST NON AFRICAN AMERICAN: 55 mL/min — AB (ref >60)
Glucose, Bld: 197 mg/dL — ABNORMAL HIGH (ref 65–99)
Potassium: 4.8 mmol/L (ref 3.5–5.1)
Sodium: 134 mmol/L — ABNORMAL LOW (ref 135–145)

## 2016-12-15 LAB — CBC
HEMATOCRIT: 31.4 % — AB (ref 36.0–46.0)
Hemoglobin: 10.4 g/dL — ABNORMAL LOW (ref 12.0–15.0)
MCH: 27.7 pg (ref 26.0–34.0)
MCHC: 33.1 g/dL (ref 30.0–36.0)
MCV: 83.5 fL (ref 78.0–100.0)
PLATELETS: 399 10*3/uL (ref 150–400)
RBC: 3.76 MIL/uL — ABNORMAL LOW (ref 3.87–5.11)
RDW: 13.9 % (ref 11.5–15.5)
WBC: 11.5 10*3/uL — AB (ref 4.0–10.5)

## 2016-12-15 LAB — GLUCOSE, CAPILLARY
GLUCOSE-CAPILLARY: 137 mg/dL — AB (ref 65–99)
GLUCOSE-CAPILLARY: 115 mg/dL — AB (ref 65–99)

## 2016-12-15 MED ORDER — ASPIRIN 81 MG PO CHEW: 81.0000 mg | CHEWABLE_TABLET | Freq: Two times a day (BID) | ORAL | 1 refills | Status: DC

## 2016-12-15 MED ORDER — HYDROCODONE-ACETAMINOPHEN 5-325 MG PO TABS: 1.0000 | ORAL_TABLET | ORAL | 0 refills | Status: DC | PRN

## 2016-12-15 MED ORDER — DOCUSATE SODIUM 100 MG PO CAPS: 100.0000 mg | ORAL_CAPSULE | Freq: Two times a day (BID) | ORAL | 1 refills | Status: DC

## 2016-12-15 MED ORDER — FAMOTIDINE 20 MG PO TABS: 40.0000 mg | ORAL_TABLET | Freq: Two times a day (BID) | ORAL | 0 refills | Status: DC

## 2016-12-15 MED ORDER — METHOCARBAMOL 500 MG PO TABS: 500.0000 mg | ORAL_TABLET | Freq: Four times a day (QID) | ORAL | 0 refills | Status: DC | PRN

## 2016-12-15 MED ORDER — ONDANSETRON HCL 4 MG PO TABS: 4.0000 mg | ORAL_TABLET | Freq: Four times a day (QID) | ORAL | 0 refills | Status: DC | PRN

## 2016-12-15 MED ORDER — SENNA 8.6 MG PO TABS: 2.0000 | ORAL_TABLET | Freq: Every day | ORAL | 0 refills | Status: DC

## 2016-12-15 MED ORDER — HYDROMORPHONE HCL 1 MG/ML IJ SOLN
0.5000 mg | INTRAMUSCULAR | Status: DC | PRN
Start: 2016-12-15 — End: 2016-12-15
  Administered 2016-12-15: 03:00:00 0.5 mg via INTRAVENOUS
  Filled 2016-12-15: qty 1

## 2016-12-15 NOTE — Anesthesia Postprocedure Evaluation (Addendum)
Anesthesia Post Note  Patient: [name]  Procedure(s) Performed: Procedure(s) (LRB): RIGHT TOTAL KNEE ARTHROPLASTY WITH COMPUTER NAVIGATION (Right)  Patient location during evaluation: PACU Anesthesia Type: Spinal Level of consciousness: oriented and awake and alert Pain management: pain level controlled Vital Signs Assessment: post-procedure vital signs reviewed and stable Respiratory status: spontaneous breathing, respiratory function stable and patient connected to nasal cannula oxygen Cardiovascular status: blood pressure returned to baseline and stable Postop Assessment: no headache and no backache Anesthetic complications: no       Last Vitals:  Vitals:   12/15/16 0210 12/15/16 0633  BP: 107/67 (!) 97/51  Pulse: 65 68  Resp: 17 17  Temp: 36.7 C 36.8 C    Last Pain:  Vitals:   12/15/16 0637  TempSrc:   PainSc: [place]

## 2016-12-15 NOTE — Progress Notes (Signed)
Spoke with patient at bedside, discussed need for [facility] services. Patient previously used [facility] for [facility] services, contacted them and they are unable to take until 3/21. Contacted [facility] per [employer] protocol, provided them with information and services needed, faxed orders to [phone]. Discussed with patient and nurse that not sure who will provide [facility] or when services will be set up. Nurse will notify attending to see if OP an option, PT to provide homework with last session.

## 2016-12-15 NOTE — Progress Notes (Signed)
Physical Therapy Treatment Patient Details Name: [name] MRN: [id number] DOB: 11-Aug-1956 Today's Date: 12/15/2016    History of Present Illness Pt s/p R TKR and with hx of L TKR 9/17    PT Comments    Pt performed therex program with written instruction provided - pt uncertain when PT follow up can be arranged.   Follow Up Recommendations  Home health PT     Equipment Recommendations  None recommended by PT    Recommendations for Other Services       Precautions / Restrictions Precautions Precautions: Knee;Fall Restrictions Weight Bearing Restrictions: No Other Position/Activity Restrictions: WBAT    Mobility  Bed Mobility                  Transfers Overall transfer level: Needs assistance Equipment used: Rolling walker (2 wheeled) Transfers: Sit to/from Stand Sit to Stand: Min guard         General transfer comment: cues for LE management and use of UEs to self assist  Ambulation/Gait Ambulation/Gait assistance: Min guard;Supervision Ambulation Distance (Feet): 75 Feet (and 15' into bathroom) Assistive device: Rolling walker (2 wheeled) Gait Pattern/deviations: Step-to pattern;Decreased step length - right;Decreased step length - left;Shuffle;Trunk flexed Gait velocity: decr Gait velocity interpretation: Below normal speed for age/gender General Gait Details: cues for posture, position from RW and initial sequence   Stairs Stairs: Yes   Stair Management: One rail Left;Step to pattern;Forwards;With crutches Number of Stairs: 8 General stair comments: cues for sequence and foot/crutch placement  Wheelchair Mobility    Modified Rankin (Stroke Patients Only)       Balance                                    Cognition Arousal/Alertness: Awake/alert Behavior During Therapy: WFL for tasks assessed/performed Overall Cognitive Status: Within Functional Limits for tasks assessed                      Exercises  Total Joint Exercises Ankle Circles/Pumps: AROM;Both;15 reps;Supine Quad Sets: AROM;Both;10 reps;Supine Towel Squeeze: AROM;Both;10 reps;Supine Heel Slides: AAROM;Right;15 reps;Supine Hip ABduction/ADduction: AAROM;Right;10 reps;Supine Straight Leg Raises: AAROM;AROM;Right;15 reps;Supine Long Arc Quad: AAROM;AROM;Right;10 reps;Seated Knee Flexion: AROM;AAROM;Right;10 reps;Seated    General Comments        Pertinent Vitals/Pain Pain Assessment: 0-10 Pain Score: 5  Pain Location: R knee Pain Descriptors / Indicators: Aching;Sore Pain Intervention(s): Limited activity within patient's tolerance;Monitored during session;Premedicated before session;Ice applied    Home Living                      Prior Function            PT Goals (current goals can now be found in the care plan section) Acute Rehab PT Goals Patient Stated Goal: Regain IND  PT Goal Formulation: With patient Time For Goal Achievement: 12/18/16 Potential to Achieve Goals: Good Progress towards PT goals: Progressing toward goals    Frequency    7X/week      PT Plan Current plan remains appropriate    Co-evaluation             End of Session Equipment Utilized During Treatment: Gait belt Activity Tolerance: Patient tolerated treatment well Patient left: in chair;with call bell/phone within reach;with chair alarm set Nurse Communication: Mobility status PT Visit Diagnosis: Difficulty in walking, not elsewhere classified (R26.2)     Time: 6812-7517 PT  Time Calculation (min) (ACUTE ONLY): 28 min  Charges:  $Gait Training: 23-37 mins $Therapeutic Exercise: 23-37 mins                    G Codes:       [name] Dec 26, 2016, 4:45 PM

## 2016-12-15 NOTE — Evaluation (Signed)
Physical Therapy Evaluation Patient Details Name: [name] MRN: [id number] DOB: [birth year] Today's Date: 12/15/2016   History of Present Illness  Pt s/p R TKR and with hx of L TKR 9/17  Clinical Impression  Pt s/p R TKR and presents with decreased R LE strength/ROM and post op pain limiting functional mobility.  Pt should progress to dc home with family assist    Follow Up Recommendations Home health PT    Equipment Recommendations  None recommended by PT    Recommendations for Other Services       Precautions / Restrictions Precautions Precautions: Knee;Fall Restrictions Weight Bearing Restrictions: No Other Position/Activity Restrictions: WBAT      Mobility  Bed Mobility Overal bed mobility: Needs Assistance Bed Mobility: Supine to Sit     Supine to sit: Supervision        Transfers Overall transfer level: Needs assistance   Transfers: Sit to/from Stand Sit to Stand: Min guard         General transfer comment: cues for LE management and use of UEs to self assist  Ambulation/Gait Ambulation/Gait assistance: Min assist Ambulation Distance (Feet): 140 Feet Assistive device: Rolling walker (2 wheeled) Gait Pattern/deviations: Step-to pattern;Decreased step length - right;Decreased step length - left;Shuffle;Trunk flexed Gait velocity: decr Gait velocity interpretation: Below normal speed for age/gender General Gait Details: cues for posture, position from RW and initial sequence  Stairs            Wheelchair Mobility    Modified Rankin (Stroke Patients Only)       Balance                                             Pertinent Vitals/Pain Pain Assessment: 0-10 Pain Score: 4  Pain Location: R knee Pain Descriptors / Indicators: Aching;Sore Pain Intervention(s): Limited activity within patient's tolerance;Monitored during session;Premedicated before session;Ice applied    Home Living Family/patient expects to be  discharged to:: Private residence Living Arrangements: Spouse/significant other Available Help at Discharge: Family Type of Home: House Home Access: Stairs to enter Entrance Stairs-Rails: Right Entrance Stairs-Number of Steps: 7 Home Layout: One level Home Equipment: Shower seat - built in;Walker - 2 wheels      Prior Function Level of Independence: Independent         Comments: pt works in [place] at [employer]        Extremity/Trunk Assessment   Upper Extremity Assessment Upper Extremity Assessment: Overall WFL for tasks assessed    Lower Extremity Assessment Lower Extremity Assessment: RLE deficits/detail RLE Deficits / Details: 3/5 quads with IND SLR and AAROM at R knee -10- 80    Cervical / Trunk Assessment Cervical / Trunk Assessment: Normal  Communication   Communication: No difficulties  Cognition Arousal/Alertness: Awake/alert Behavior During Therapy: WFL for tasks assessed/performed Overall Cognitive Status: Within Functional Limits for tasks assessed                      General Comments      Exercises Total Joint Exercises Ankle Circles/Pumps: AROM;Both;15 reps;Supine Quad Sets: AROM;Both;10 reps;Supine Heel Slides: AAROM;Right;15 reps;Supine Straight Leg Raises: AAROM;AROM;Right;15 reps;Supine   Assessment/Plan    PT Assessment Patient needs continued PT services  PT Problem List Decreased strength;Decreased range of motion;Decreased activity tolerance;Decreased mobility;Decreased knowledge of use of DME;Pain  PT Treatment Interventions DME instruction;Gait training;Stair training;Functional mobility training;Therapeutic activities;Therapeutic exercise;Patient/family education    PT Goals (Current goals can be found in the Care Plan section)  Acute Rehab PT Goals Patient Stated Goal: Regain IND  PT Goal Formulation: With patient Time For Goal Achievement: 12/18/16 Potential to Achieve Goals: Good     Frequency 7X/week   Barriers to discharge        Co-evaluation               End of Session Equipment Utilized During Treatment: Gait belt Activity Tolerance: Patient tolerated treatment well Patient left: in chair;with call bell/phone within reach;with chair alarm set Nurse Communication: Mobility status PT Visit Diagnosis: Difficulty in walking, not elsewhere classified (R26.2)         Time: 5366-4403 PT Time Calculation (min) (ACUTE ONLY): 38 min   Charges:   PT Evaluation $PT Eval Low Complexity: 1 Procedure PT Treatments $Gait Training: 8-22 mins $Therapeutic Exercise: 8-22 mins   PT G Codes:         Henlee Donovan 12/15/2016, 10:45 AM

## 2016-12-15 NOTE — Progress Notes (Signed)
OT Cancellation Note  Patient Details Name: [name] MRN: [id number] DOB: Feb 18, 1956   Cancelled Treatment:    Reason Eval/Treat Not Completed: OT screened, no needs identified, will sign off. Per conversation with PT, pt with recent prior knee surgery. Will sign off.  Hortencia Pilar 12/15/2016, 10:17 AM

## 2016-12-15 NOTE — Discharge Summary (Signed)
Physician Discharge Summary  Patient ID: [name] MRN: [id number] DOB/AGE: 05/02/1956 61 y.o.  Admit date: 12/14/2016 Discharge date: 12/15/2016  Admission Diagnoses:  Osteoarthritis of right knee  Discharge Diagnoses:  Principal Problem:   Osteoarthritis of right knee   Past Medical History:  Diagnosis Date  . Breast cancer [facility]) 2011   right breast with axillary lymph node removal  . Diabetes mellitus, type II ([place])    type 2  . GERD (gastroesophageal reflux disease)   . Hypertension   . Osteoarthritis of both knees   . Pneumonia    in early 1980's    Surgeries: Procedure(s): RIGHT TOTAL KNEE ARTHROPLASTY WITH COMPUTER NAVIGATION on 12/14/2016   Consultants (if any):   Discharged Condition: Improved  Hospital Course: [name] is an 61 y.o. female who was admitted 12/14/2016 with a diagnosis of Osteoarthritis of right knee and went to the operating room on 12/14/2016 and underwent the above named procedures.    She was given perioperative antibiotics:  Anti-infectives    Start     Dose/Rate Route Frequency Ordered Stop   12/15/16 0600  ceFAZolin (ANCEF) IVPB 2g/100 mL premix     2 g 200 mL/hr over 30 Minutes Intravenous On call to O.R. 12/14/16 1354 12/14/16 1701   12/14/16 2300  ceFAZolin (ANCEF) IVPB 2g/100 mL premix     2 g 200 mL/hr over 30 Minutes Intravenous Every 6 hours 12/14/16 2103 12/15/16 0502    .  She was given sequential compression devices, early ambulation, and ASA for DVT prophylaxis.  She benefited maximally from the hospital stay and there were no complications.    Recent vital signs:  Vitals:   12/15/16 0210 12/15/16 0633  BP: 107/67 (!) 97/51  Pulse: 65 68  Resp: 17 17  Temp: 98 F (36.7 C) 98.3 F (36.8 C)    Recent laboratory studies:  Lab Results  Component Value Date   HGB 10.4 (L) 12/15/2016   HGB 12.1 12/06/2016   HGB 10.7 (L) 06/30/2016   Lab Results  Component Value Date   WBC 11.5 (H) 12/15/2016   PLT  399 12/15/2016   No results found for: INR Lab Results  Component Value Date   NA 134 (L) 12/15/2016   K 4.8 12/15/2016   CL 106 12/15/2016   CO2 21 (L) 12/15/2016   BUN 19 12/15/2016   CREATININE 1.07 (H) 12/15/2016   GLUCOSE 197 (H) 12/15/2016    Discharge Medications:   Allergies as of 12/15/2016   No Known Allergies     Medication List    STOP taking these medications   acetaminophen 500 MG tablet Commonly known as:  TYLENOL   hydrocortisone cream 1 %   pantoprazole 40 MG tablet Commonly known as:  PROTONIX   traMADol 50 MG tablet Commonly known as:  ULTRAM     TAKE these medications   amLODipine 10 MG tablet Commonly known as:  NORVASC TAKE 1 TABLET BY MOUTH EVERY DAY   aspirin 81 MG chewable tablet Chew 1 tablet (81 mg total) by mouth 2 (two) times daily.   diclofenac 75 MG EC tablet Commonly known as:  VOLTAREN Take 1 tablet (75 mg total) by mouth 2 (two) times daily as needed. Needs visit for any more refills What changed:  when to take this  additional instructions   docusate sodium 100 MG capsule Commonly known as:  COLACE Take 1 capsule (100 mg total) by mouth 2 (two) times daily.   famotidine  20 MG tablet Commonly known as:  PEPCID Take 2 tablets (40 mg total) by mouth 2 (two) times daily.   furosemide 20 MG tablet Commonly known as:  LASIX TAKE 1 TABLET BY MOUTH AS NEEDED. TAKE WITH 20 MEQ POTASSIUM What changed:  See the new instructions.   HYDROcodone-acetaminophen 5-325 MG tablet Commonly known as:  NORCO/VICODIN Take 1-2 tablets by mouth every 4 (four) hours as needed (breakthrough pain).   MAGNESIUM PO Take 1 tablet by mouth daily.   metFORMIN 500 MG tablet Commonly known as:  GLUCOPHAGE TAKE 1 TABLET BY MOUTH TWICE DAILY WITH FOOD   methocarbamol 500 MG tablet Commonly known as:  ROBAXIN Take 1 tablet (500 mg total) by mouth every 6 (six) hours as needed for muscle spasms.   neomycin-polymyxin-hydrocortisone otic  solution Commonly known as:  CORTISPORIN Place 3 drops into both ears 3 (three) times daily. What changed:  when to take this   ondansetron 4 MG tablet Commonly known as:  ZOFRAN Take 1 tablet (4 mg total) by mouth every 6 (six) hours as needed for nausea.   potassium chloride SA 20 MEQ tablet Commonly known as:  K-DUR,KLOR-CON TAKE 1 TABLET BY MOUTH EVERY DAY AS NEEDED   senna 8.6 MG Tabs tablet Commonly known as:  SENOKOT Take 2 tablets (17.2 mg total) by mouth at bedtime.   spironolactone 25 MG tablet Commonly known as:  ALDACTONE TAKE 1 TABLET BY MOUTH EVERY DAY   TURMERIC PO Take 1 tablet by mouth daily.       Diagnostic Studies: Dg Knee Right Port  Result Date: 12/14/2016 CLINICAL DATA:  Status post right total knee replacement EXAM: PORTABLE RIGHT KNEE - 1-2 VIEW COMPARISON:  None. FINDINGS: Patient is status post tricompartmental knee replacement. No evidence for immediate hardware complications. Gas within the joint and overlying soft tissues is compatible with the immediate postoperative state. IMPRESSION: Status post tricompartmental knee replacement without evidence for immediate hardware complications. Electronically Signed   By: Misty Stanley M.D.   On: 12/14/2016 19:57    Disposition: 06-Home-Health Care Svc  Discharge Instructions    Call MD / Call 911    Complete by:  As directed    If you experience chest pain or shortness of breath, CALL 911 and be transported to the hospital emergency room.  If you develope a fever above 101 F, pus (white drainage) or increased drainage or redness at the wound, or calf pain, call your surgeon's office.   Constipation Prevention    Complete by:  As directed    Drink plenty of fluids.  Prune juice may be helpful.  You may use a stool softener, such as Colace (over the counter) 100 mg twice a day.  Use MiraLax (over the counter) for constipation as needed.   Diet - low sodium heart healthy    Complete by:  As directed    Do  not put a pillow under the knee. Place it under the heel.    Complete by:  As directed    Driving restrictions    Complete by:  As directed    No driving for 6 weeks   Increase activity slowly as tolerated    Complete by:  As directed    Lifting restrictions    Complete by:  As directed    No lifting for 6 weeks   TED hose    Complete by:  As directed    Use stockings (TED hose) for 2 weeks on both leg(s).  You may remove them at night for sleeping.      Follow-up Information    Barba Solt, Horald Pollen, MD. Schedule an appointment as soon as possible for a visit in 2 weeks.   Specialty:  Orthopedic Surgery Why:  For wound re-check Contact information: [address] [address] [phone]            Signed: Elie Goody 12/15/2016, 9:08 AM

## 2016-12-15 NOTE — Progress Notes (Signed)
Physical Therapy Treatment Patient Details Name: [name] MRN: [id number] DOB: 18-Aug-1956 Today's Date: 2016/12/28    History of Present Illness Pt s/p R TKR and with hx of L TKR 9/17    PT Comments    Pt progressing well with mobility and eager for return home.  Will return for therex after lunch.  Follow Up Recommendations  Home health PT     Equipment Recommendations  None recommended by PT    Recommendations for Other Services       Precautions / Restrictions Precautions Precautions: Knee;Fall Restrictions Weight Bearing Restrictions: No Other Position/Activity Restrictions: WBAT    Mobility  Bed Mobility                  Transfers Overall transfer level: Needs assistance Equipment used: Rolling walker (2 wheeled) Transfers: Sit to/from Stand Sit to Stand: Min guard         General transfer comment: cues for LE management and use of UEs to self assist  Ambulation/Gait Ambulation/Gait assistance: Min guard;Supervision Ambulation Distance (Feet): 75 Feet (and 15' into bathroom) Assistive device: Rolling walker (2 wheeled) Gait Pattern/deviations: Step-to pattern;Decreased step length - right;Decreased step length - left;Shuffle;Trunk flexed Gait velocity: decr Gait velocity interpretation: Below normal speed for age/gender General Gait Details: cues for posture, position from RW and initial sequence   Stairs Stairs: Yes   Stair Management: One rail Left;Step to pattern;Forwards;With crutches Number of Stairs: 8 General stair comments: cues for sequence and foot/crutch placement  Wheelchair Mobility    Modified Rankin (Stroke Patients Only)       Balance                                    Cognition Arousal/Alertness: Awake/alert Behavior During Therapy: WFL for tasks assessed/performed Overall Cognitive Status: Within Functional Limits for tasks assessed                      Exercises       General Comments        Pertinent Vitals/Pain Pain Assessment: 0-10 Pain Score: 4  Pain Location: R knee Pain Descriptors / Indicators: Aching;Sore Pain Intervention(s): Limited activity within patient's tolerance;Monitored during session;Premedicated before session    Home Living                      Prior Function            PT Goals (current goals can now be found in the care plan section) Acute Rehab PT Goals Patient Stated Goal: Regain IND  PT Goal Formulation: With patient Time For Goal Achievement: 12/18/16 Potential to Achieve Goals: Good    Frequency    7X/week      PT Plan Current plan remains appropriate    Co-evaluation             End of Session Equipment Utilized During Treatment: Gait belt Activity Tolerance: Patient tolerated treatment well Patient left: in chair;with call bell/phone within reach;with chair alarm set Nurse Communication: Mobility status PT Visit Diagnosis: Difficulty in walking, not elsewhere classified (R26.2)     Time: 7026-3785 PT Time Calculation (min) (ACUTE ONLY): 32 min  Charges:  $Gait Training: 23-37 mins                    G Codes:       Vernice Bowker 12/28/2016, 4:38 PM

## 2016-12-15 NOTE — Progress Notes (Signed)
   Subjective:  Patient reports pain as mild to moderate.  Denies N/V/CP/SOB.  Objective:   VITALS:   Vitals:   12/14/16 2300 12/14/16 2349 12/15/16 0210 12/15/16 0633  BP: 133/78 120/65 107/67 (!) 97/51  Pulse: 68 83 65 68  Resp: 17 17 17 17   Temp: 97.8 F (36.6 C) 98.1 F (36.7 C) 98 F (36.7 C) 98.3 F (36.8 C)  TempSrc: Oral Oral Oral Oral  SpO2: 99% 97% 98% 99%  Weight:      Height:        NAD ABD soft Sensation intact distally Intact pulses distally Dorsiflexion/Plantar flexion intact Incision: dressing C/D/I Compartment soft   Lab Results  Component Value Date   WBC 11.5 (H) 12/15/2016   HGB 10.4 (L) 12/15/2016   HCT 31.4 (L) 12/15/2016   MCV 83.5 12/15/2016   PLT 399 12/15/2016   BMET    Component Value Date/Time   NA 134 (L) 12/15/2016 0428   K 4.8 12/15/2016 0428   CL 106 12/15/2016 0428   CO2 21 (L) 12/15/2016 0428   GLUCOSE 197 (H) 12/15/2016 0428   BUN 19 12/15/2016 0428   CREATININE 1.07 (H) 12/15/2016 0428   CALCIUM 9.5 12/15/2016 0428   GFRNONAA 55 (L) 12/15/2016 0428   GFRAA >60 12/15/2016 0428     Assessment/Plan: 1 Day Post-Op   Principal Problem:   Osteoarthritis of right knee   WBAT with walker DVT ppx: ASA, SCDS, TEDS PO pain control PT/OT D/C home with HHPT   Alyssabeth Bruster, Horald Pollen 12/15/2016, 9:04 AM   Rod Can, MD Cell [phone]

## 2016-12-15 NOTE — Progress Notes (Signed)
Several calls from [facility], they say they have not received orders despite confirmations from 2 fax machines, I questioned their receiving. Also have received a call from [facility] stating they received a referral from [facility] and asked for Demographic information. Demographics and orders faxed to [facility] at [phone] and [phone]. Spoke with patient to make her aware of progress.

## 2016-12-16 ENCOUNTER — Other Ambulatory Visit: Payer: Self-pay | Admitting: Internal Medicine

## 2016-12-16 DIAGNOSIS — I5022 Chronic systolic (congestive) heart failure: Secondary | ICD-10-CM

## 2016-12-18 LAB — TYPE AND SCREEN
ABO/RH(D): O POS
Antibody Screen: POSITIVE
DAT, IgG: NEGATIVE
UNIT DIVISION: 0
Unit division: 0

## 2016-12-18 LAB — BPAM RBC
BLOOD PRODUCT EXPIRATION DATE: 201804112359
BLOOD PRODUCT EXPIRATION DATE: 201804122359
Unit Type and Rh: 5100
Unit Type and Rh: 5100

## 2016-12-20 NOTE — Progress Notes (Signed)
   12/15/16 1000  PT Time Calculation  PT Start Time (ACUTE ONLY) 0908  PT Stop Time (ACUTE ONLY) 0946  PT Time Calculation (min) (ACUTE ONLY) 38 min  PT G-Codes **NOT FOR INPATIENT CLASS**  Functional Assessment Tool Used Clinical judgement  Functional Limitation Mobility: Walking and moving around  Mobility: Walking and Moving Around Current Status (A0045) CJ  Mobility: Walking and Moving Around Goal Status (T9774) CI  PT General Charges  $$ ACUTE PT VISIT 1 Procedure  PT Evaluation  $PT Eval Low Complexity 1 Procedure  PT Treatments  $Gait Training 8-22 mins  $Therapeutic Exercise 8-22 mins

## 2016-12-22 ENCOUNTER — Other Ambulatory Visit: Payer: Self-pay | Admitting: *Deleted

## 2016-12-22 NOTE — Patient Outreach (Signed)
[facility]) Care Management  12/22/2016  BRENETTA PENNY 11/05/1955 [id number]   Late entry for 12/21/16 @ 10:00am  Per [employer] RNCM Wilhemina Cash, patient is currently being followed by internal [employer] CM, and no need for [facility] Care Management to outreach to patient.  [facility] RNCM will proceed with case closure, send closure due to other CM program request to [facility] Management.     Arsalan Brisbin H. Annia Friendly, BSN, [place] Management [facility] Telephonic CM Phone: [phone] Fax: [phone]

## 2017-01-02 ENCOUNTER — Ambulatory Visit (HOSPITAL_COMMUNITY): Payer: Managed Care, Other (non HMO) | Attending: Orthopedic Surgery | Admitting: Physical Therapy

## 2017-01-02 ENCOUNTER — Encounter (HOSPITAL_COMMUNITY): Payer: Self-pay | Admitting: Physical Therapy

## 2017-01-02 DIAGNOSIS — M25562 Pain in left knee: Secondary | ICD-10-CM | POA: Diagnosis present

## 2017-01-02 DIAGNOSIS — R600 Localized edema: Secondary | ICD-10-CM

## 2017-01-02 DIAGNOSIS — M6281 Muscle weakness (generalized): Secondary | ICD-10-CM | POA: Diagnosis present

## 2017-01-02 DIAGNOSIS — R262 Difficulty in walking, not elsewhere classified: Secondary | ICD-10-CM

## 2017-01-02 DIAGNOSIS — M25661 Stiffness of right knee, not elsewhere classified: Secondary | ICD-10-CM | POA: Diagnosis present

## 2017-01-02 DIAGNOSIS — R601 Generalized edema: Secondary | ICD-10-CM | POA: Diagnosis present

## 2017-01-02 NOTE — Therapy (Signed)
[place], [place], [address] Phone: [phone]   Fax:  [phone]  Physical Therapy Evaluation  Patient Details  Name: [name] MRN: [id number] Date of Birth: 02/28/1956 Referring Provider: Rod Can   Encounter Date: 01/02/2017      PT End of Session - 01/02/17 1740    Visit Number 1   Number of Visits 19   Date for PT Re-Evaluation 01/23/17   Authorization Type [employer] Managed (20 visit limit)   Authorization Time Period 01/02/17 to 02/13/17   Authorization - Visit Number 1   Authorization - Number of Visits 20   PT Start Time 7494   PT Stop Time 1726   PT Time Calculation (min) 36 min   Activity Tolerance Patient tolerated treatment well   Behavior During Therapy [facility] for tasks assessed/performed      Past Medical History:  Diagnosis Date  . Breast cancer [facility]) 2011   right breast with axillary lymph node removal  . Diabetes mellitus, type II ([place])    type 2  . GERD (gastroesophageal reflux disease)   . Hypertension   . Osteoarthritis of both knees   . Pneumonia    in early 1980's    Past Surgical History:  Procedure Laterality Date  . ABDOMINAL HYSTERECTOMY    . APPENDECTOMY  1978  . BLADDER SUSPENSION    . KNEE ARTHROPLASTY Left 06/29/2016   Procedure: LEFT TOTAL KNEE WITH NAVIGATION;  Surgeon: Rod Can, MD;  Location: [facility];  Service: Orthopedics;  Laterality: Left;  . KNEE ARTHROPLASTY Right 12/14/2016   Procedure: RIGHT TOTAL KNEE ARTHROPLASTY WITH COMPUTER NAVIGATION;  Surgeon: Rod Can, MD;  Location: [facility] ORS;  Service: Orthopedics;  Laterality: Right;  Needs RNFA  . MASTECTOMY, PARTIAL     right breast  . MENISCUS REPAIR     right knee  . OOPHORECTOMY      There were no vitals filed for this visit.       Subjective Assessment - 01/02/17 1653    Subjective Patient states that she has had problems with her R knee for a long time, bone on bone and very  painful; no conservative treatments worked and she had TKR surgery on March 15th with Dr. Lyla Glassing. She got HHPT but has been discharged. Her knee is painful at night and mobility in general is hard for her to do; she cannot sit/stand very long. Walking is a little off and it is painful. Her balance has been pretty good, no close calls.    Pertinent History HTN, DM, chronic venous insufficiency, B TKR    How long can you sit comfortably? 4/3- 30 minutes    How long can you stand comfortably? 4/3- 10 minutes    How long can you walk comfortably? 4/3- 1/2 block    Patient Stated Goals improve ROM and strength, get back to PLOF    Currently in Pain? Yes   Pain Score 5    Pain Location Knee   Pain Orientation Right   Pain Descriptors / Indicators Throbbing;Burning;Pressure   Pain Type Surgical pain   Pain Radiating Towards none    Pain Onset 1 to 4 weeks ago   Pain Frequency Constant   Aggravating Factors  sitting with leg down, bending knee too far    Pain Relieving Factors elevating, exercise    Effect of Pain on Daily Activities severe impact  [facility] PT Assessment - 01/02/17 0001      Assessment   Medical Diagnosis R TKR    Referring Provider Aaron Edelman [place]ck    Onset Date/Surgical Date 12/14/16   Next MD Visit Dr. Lyla Glassing April 30th    Prior Therapy HHPT      Precautions   Precautions None     Balance Screen   Has the patient fallen in the past 6 months No   Has the patient had a decrease in activity level because of a fear of falling?  No   Is the patient reluctant to leave their home because of a fear of falling?  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; lots of sitting and travelling    Leisure reading, going to [employer], going outside, gardening       Observation/Other Assessments   Observations incision appears well healing      AROM   Right Knee Extension 15   Right Knee Flexion 105     Strength   Right Hip Flexion 4+/5   Right Hip Extension 3/5   Right Hip ABduction 4/5   Left Hip Flexion 5/5   Left Hip Extension 3/5   Left Hip ABduction 4+/5   Right Knee Flexion 3+/5   Right Knee Extension 5/5   Left Knee Flexion 4/5   Left Knee Extension 4+/5   Right Ankle Dorsiflexion 5/5   Left Ankle Dorsiflexion 4/5     6 minute walk test results    Aerobic Endurance Distance Walked 502   Endurance additional comments 3MWT     High Level Balance   High Level Balance Comments TUG 10                           PT Education - 01/02/17 1740    Education provided Yes   Education Details examination findings, POC, HEP, prognosis    Person(s) Educated Patient   Methods Explanation;Demonstration;Handout   Comprehension Verbalized understanding;Returned demonstration;Need further instruction          PT Short Term Goals - 01/02/17 1745      PT SHORT TERM GOAL #1   Title Patient to demonstrate R knee ROM as being 0-115 degrees in order to reduce pain and improve mobility    Time 3   Period Weeks   Status New     PT SHORT TERM GOAL #2   Title Patient to be independent in scar massage, patella mobilizations, edema control in order to facilitate self-efficacy in managing condition    Time 3   Period Weeks   Status New     PT SHORT TERM GOAL #3   Title Patient to be independent in correctly and consistently performing appropriate HEP, to be updated PRN    Time 3   Period Weeks   Status New           PT Long Term Goals - 01/02/17 1747      PT LONG TERM GOAL #1   Title Patient to demonstrate functional strength 5/5 in order to improve gait and reduce pain    Time 6   Period Weeks   Status New     PT LONG TERM GOAL #2   Title Patient to be able to ambulate 724ft during 3MWT in  order to improve  community access and overall mobiltiy    Time 6   Period Weeks   Status New     PT LONG TERM GOAL #3   Title Patient to be able to reciprocally ascend/descend full flight of staris with good eccentric control and minimal unsteadiness in order to enhance home and community access    Time 6   Period Weeks   Status New     PT LONG TERM GOAL #4   Title Patient to report she has been able to work in her garden and sleep without pain in order to improve general QOL    Time 6   Period Weeks   Status New               Plan - 01/02/17 1742    Clinical Impression Statement Patient arrives approximately 2.5 weeks after R TKR surgery; she reports that everything has gone well so far but she is looking forward to returning to her PLOF. Examination reveals R knee stiffness, localized edema, functional strength deficit, gait impairment, and reduced tolerance to functional task performance. Incision appears well healing and without signs of infection/inflammation. Recommend skilled PT services in order to address functional limitations and optimize overall level of function.    Rehab Potential Good   Clinical Impairments Affecting Rehab Potential (+) high PLOF, past success with TKR/past PT    PT Frequency 3x / week   PT Duration 6 weeks   PT Treatment/Interventions ADLs/Self Care Home Management;Biofeedback;Cryotherapy;Moist Heat;DME Instruction;Gait training;Stair training;Functional mobility training;Therapeutic activities;Therapeutic exercise;Balance training;Neuromuscular re-education;Patient/family education;Manual techniques;Scar mobilization;Passive range of motion;Energy conservation   PT Next Visit Plan review HEP and goals/initial eval; focus on ROM and edema control first, monitor incision. Gait and balance training.    PT Home Exercise Plan Eval: quad sets, self knee extension overpressure, knee flexion stretch, walking program    Consulted and Agree with Plan of Care Patient       Patient will benefit from skilled therapeutic intervention in order to improve the following deficits and impairments:  Abnormal gait, Decreased skin integrity, Pain, Decreased coordination, Decreased mobility, Decreased range of motion, Decreased strength, Decreased activity tolerance, Decreased balance, Difficulty walking, Impaired flexibility  Visit Diagnosis: Stiffness of right knee, not elsewhere classified - Plan: PT plan of care cert/re-cert  Localized edema - Plan: PT plan of care cert/re-cert  Difficulty in walking, not elsewhere classified - Plan: PT plan of care cert/re-cert  Muscle weakness (generalized) - Plan: PT plan of care cert/re-cert     Problem List Patient Active Problem List   Diagnosis Date Noted  . Osteoarthritis of right knee 12/14/2016  . Primary osteoarthritis of left knee 06/29/2016  . Diabetes mellitus type 2, uncomplicated ([place]) 03/25/7627  . GERD (gastroesophageal reflux disease) 04/16/2015  . Constipation 04/16/2015  . Chronic venous insufficiency 02/10/2015  . Osteoarthritis 02/10/2015  . Obesity 02/10/2015  . OSA (obstructive sleep apnea) 09/05/2012  . Essential hypertension, benign 12/31/2011    Deniece Ree PT, DPT [facility] [address] [place], [place], [address] Phone: [phone]   Fax:  [phone]  Name: [name] MRN: [id number] Date of Birth: November 27, 1955

## 2017-01-02 NOTE — Patient Instructions (Signed)
   QUAD SET  Tighten your top thigh muscle as you attempt to press the back of your knee downward towards the table.  Hold for 3-5 seconds and relax.  Repeat 10-15 times, 3-5 times per day.    KNEE FLEXION STRETCH - SELF ASSISTED  While seated in a chair, use your unaffected leg to bend your affected knee until a stretch is felt.  Hold for 10 seconds and relax.  Repeat 10 times, 3-5 times per day.     SEATED HAMSTRING STRETCH  While seated, rest your heel on the floor with your knee straight and gently lean forward until a stretch is felt behind your knee/thigh.  Place your hands just above your knee and push as much as you can tolerate to try to get the knee straight.   Hold for 10 seconds and relax.  Repeat 10 times, 3-5 times per day.    REGULAR WALKING PROGRAM   The next 3 days, use your smart phone or a pedometer to see what your baseline level of steps is.  On day 4, increase your steps by 500. Continue to progress.  Example:  Day 1 600 steps  Day 2 600 steps   Day 3 Day 600 steps   Day 4 1100 steps  Day 5 1100 steps   Day 6 1100 steps   Day 7 1600 steps

## 2017-01-04 ENCOUNTER — Ambulatory Visit (HOSPITAL_COMMUNITY): Payer: Managed Care, Other (non HMO) | Admitting: Physical Therapy

## 2017-01-04 DIAGNOSIS — M25661 Stiffness of right knee, not elsewhere classified: Secondary | ICD-10-CM | POA: Diagnosis not present

## 2017-01-04 DIAGNOSIS — R262 Difficulty in walking, not elsewhere classified: Secondary | ICD-10-CM

## 2017-01-04 DIAGNOSIS — R600 Localized edema: Secondary | ICD-10-CM

## 2017-01-04 DIAGNOSIS — M6281 Muscle weakness (generalized): Secondary | ICD-10-CM

## 2017-01-04 NOTE — Therapy (Signed)
[place], [place], [address] Phone: [phone]   Fax:  [phone]  Physical Therapy Treatment  Patient Details  Name: [name] MRN: [id number] Date of Birth: 09/19/1956 Referring Provider: Rod Can   Encounter Date: 01/04/2017      PT End of Session - 01/04/17 1512    Visit Number 2   Number of Visits 19   Date for PT Re-Evaluation 01/23/17   Authorization Type [employer] Managed (20 visit limit)   Authorization Time Period 01/02/17 to 02/13/17   Authorization - Visit Number 2   Authorization - Number of Visits 20   PT Start Time 1761  patient arrived a few minutes late    PT Stop Time 1511  achieved all goals of tx session    PT Time Calculation (min) 35 min   Activity Tolerance Patient tolerated treatment well   Behavior During Therapy [facility] for tasks assessed/performed      Past Medical History:  Diagnosis Date  . Breast cancer [facility]) 2011   right breast with axillary lymph node removal  . Diabetes mellitus, type II ([place])    type 2  . GERD (gastroesophageal reflux disease)   . Hypertension   . Osteoarthritis of both knees   . Pneumonia    in early 1980's    Past Surgical History:  Procedure Laterality Date  . ABDOMINAL HYSTERECTOMY    . APPENDECTOMY  1978  . BLADDER SUSPENSION    . KNEE ARTHROPLASTY Left 06/29/2016   Procedure: LEFT TOTAL KNEE WITH NAVIGATION;  Surgeon: Rod Can, MD;  Location: [facility];  Service: Orthopedics;  Laterality: Left;  . KNEE ARTHROPLASTY Right 12/14/2016   Procedure: RIGHT TOTAL KNEE ARTHROPLASTY WITH COMPUTER NAVIGATION;  Surgeon: Rod Can, MD;  Location: [facility];  Service: Orthopedics;  Laterality: Right;  Needs RNFA  . MASTECTOMY, PARTIAL     right breast  . MENISCUS REPAIR     right knee  . OOPHORECTOMY      There were no vitals filed for this visit.      Subjective Assessment - 01/04/17 1439    Subjective patient arrives tdoay stating  she is doing well, her knee is doing OK but her HEP has been hurting    Pertinent History HTN, DM, chronic venous insufficiency, B TKR    Currently in Pain? Yes   Pain Score 4    Pain Location Knee   Pain Orientation Right                         [facility] Adult PT Treatment/Exercise - 01/04/17 0001      Ambulation/Gait   Gait Comments gait x452 ft with cues for step length/heel toe pattern      Exercises   Exercises Knee/Hip     Knee/Hip Exercises: Stretches   Active Hamstring Stretch Right;1 rep;Other (comment)  x3 minutes    Active Hamstring Stretch Limitations supine; also 2x30 seconds seated with foot stool      Knee/Hip Exercises: Seated   Other Seated Knee/Hip Exercises seated knee flexion stretch x10 3 second holds      Knee/Hip Exercises: Supine   Quad Sets Right;Other (comment);15 reps  active with 3 second hold plus 10 with overpressure    Straight Leg Raises Right;1 set;15 reps     Manual Therapy   Manual Therapy Edema management;Joint mobilization   Manual therapy comments performed separately of all other skilled services  Edema Management retrograde massage with LEs elevated    Joint Mobilization gentle patella mobilizations grade I-II                 PT Education - 01/04/17 1512    Education provided Yes   Education Details reviewed initial eval and goals, HEP hamstirng stretch, icing routine    Person(s) Educated Patient   Methods Explanation;Handout   Comprehension Verbalized understanding;Returned demonstration;Need further instruction          PT Short Term Goals - 01/02/17 1745      PT SHORT TERM GOAL #1   Title Patient to demonstrate R knee ROM as being 0-115 degrees in order to reduce pain and improve mobility    Time 3   Period Weeks   Status New     PT SHORT TERM GOAL #2   Title Patient to be independent in scar massage, patella mobilizations, edema control in order to facilitate self-efficacy in managing  condition    Time 3   Period Weeks   Status New     PT SHORT TERM GOAL #3   Title Patient to be independent in correctly and consistently performing appropriate HEP, to be updated PRN    Time 3   Period Weeks   Status New           PT Long Term Goals - 01/02/17 1747      PT LONG TERM GOAL #1   Title Patient to demonstrate functional strength 5/5 in order to improve gait and reduce pain    Time 6   Period Weeks   Status New     PT LONG TERM GOAL #2   Title Patient to be able to ambulate 798ft during 3MWT in order to improve community access and overall mobiltiy    Time 6   Period Weeks   Status New     PT LONG TERM GOAL #3   Title Patient to be able to reciprocally ascend/descend full flight of staris with good eccentric control and minimal unsteadiness in order to enhance home and community access    Time 6   Period Weeks   Status New     PT LONG TERM GOAL #4   Title Patient to report she has been able to work in her garden and sleep without pain in order to improve general QOL    Time 6   Period Weeks   Status New               Plan - 01/04/17 1513    Clinical Impression Statement Patient arrived a few minutes late today. Began by working on supine hamstring stretch which patient is able to feel actually simply by laying flat on her back at this time. Performed retrograde massage to address edema and then continued with ROM based exercises this session. Noted quite a bit of edema today with appearance of "floating patella" due to the amount of localized edema; also noted very tight R LE hamstrings and provided patient with stretch for this muscle group for HEP inclusion.    Rehab Potential Good   Clinical Impairments Affecting Rehab Potential (+) high PLOF, past success with TKR/past PT    PT Frequency 3x / week   PT Duration 6 weeks   PT Treatment/Interventions ADLs/Self Care Home Management;Biofeedback;Cryotherapy;Moist Heat;DME Instruction;Gait  training;Stair training;Functional mobility training;Therapeutic activities;Therapeutic exercise;Balance training;Neuromuscular re-education;Patient/family education;Manual techniques;Scar mobilization;Passive range of motion;Energy conservation   PT Next Visit Plan continue working on edema control and  encouraging regular icing; monitor incision. Gait and balance training. Continue R hamstring stretches.     PT Home Exercise Plan Eval: quad sets, self knee extension overpressure, knee flexion stretch, walking program    Consulted and Agree with Plan of Care Patient      Patient will benefit from skilled therapeutic intervention in order to improve the following deficits and impairments:  Abnormal gait, Decreased skin integrity, Pain, Decreased coordination, Decreased mobility, Decreased range of motion, Decreased strength, Decreased activity tolerance, Decreased balance, Difficulty walking, Impaired flexibility  Visit Diagnosis: Stiffness of right knee, not elsewhere classified  Localized edema  Difficulty in walking, not elsewhere classified  Muscle weakness (generalized)     Problem List Patient Active Problem List   Diagnosis Date Noted  . Osteoarthritis of right knee 12/14/2016  . Primary osteoarthritis of left knee 06/29/2016  . Diabetes mellitus type 2, uncomplicated ([place]) 55/97/4163  . GERD (gastroesophageal reflux disease) 04/16/2015  . Constipation 04/16/2015  . Chronic venous insufficiency 02/10/2015  . Osteoarthritis 02/10/2015  . Obesity 02/10/2015  . OSA (obstructive sleep apnea) 09/05/2012  . Essential hypertension, benign 12/31/2011    Deniece Ree PT, DPT [place] [address] [place], [place], [address] Phone: [phone]   Fax:  [phone]  Name: [name] MRN: [id number] Date of Birth: Sep 15, 1956

## 2017-01-04 NOTE — Patient Instructions (Signed)
   SEATED HAMSTRING STRETCH  While seated, rest your heel on the floor with your knee straight and gently lean forward until a stretch is felt behind your knee/thigh.  Hold for at least 30 seconds and relax. Repeat at least 2 times, 3 times per day.

## 2017-01-08 ENCOUNTER — Ambulatory Visit (HOSPITAL_COMMUNITY): Payer: Managed Care, Other (non HMO) | Admitting: Physical Therapy

## 2017-01-08 DIAGNOSIS — R262 Difficulty in walking, not elsewhere classified: Secondary | ICD-10-CM

## 2017-01-08 DIAGNOSIS — M25661 Stiffness of right knee, not elsewhere classified: Secondary | ICD-10-CM | POA: Diagnosis not present

## 2017-01-08 DIAGNOSIS — M6281 Muscle weakness (generalized): Secondary | ICD-10-CM

## 2017-01-08 DIAGNOSIS — R600 Localized edema: Secondary | ICD-10-CM

## 2017-01-08 NOTE — Therapy (Signed)
[name], [place], [address] Phone: [phone]   Fax:  [phone]  Physical Therapy Treatment  Patient Details  Name: [name] MRN: [id number] Date of Birth: 09-29-1956 Referring Provider: Rod Can   Encounter Date: 01/08/2017      PT End of Session - 01/08/17 1733    Visit Number 3   Number of Visits 19   Date for PT Re-Evaluation 01/23/17   Authorization Type [employer] Managed (20 visit limit)   Authorization Time Period 01/02/17 to 02/13/17   Authorization - Visit Number 3   Authorization - Number of Visits 20   PT Start Time 6754   PT Stop Time 1727   PT Time Calculation (min) 40 min   Activity Tolerance Patient tolerated treatment well   Behavior During Therapy [facility] for tasks assessed/performed      Past Medical History:  Diagnosis Date  . Breast cancer [facility]) 2011   right breast with axillary lymph node removal  . Diabetes mellitus, type II ([place])    type 2  . GERD (gastroesophageal reflux disease)   . Hypertension   . Osteoarthritis of both knees   . Pneumonia    in early 1980's    Past Surgical History:  Procedure Laterality Date  . ABDOMINAL HYSTERECTOMY    . APPENDECTOMY  1978  . BLADDER SUSPENSION    . KNEE ARTHROPLASTY Left 06/29/2016   Procedure: LEFT TOTAL KNEE WITH NAVIGATION;  Surgeon: Rod Can, MD;  [facility];  Service: Orthopedics;  Laterality: Left;  . KNEE ARTHROPLASTY Right 12/14/2016   Procedure: RIGHT TOTAL KNEE ARTHROPLASTY WITH COMPUTER NAVIGATION;  Surgeon: Rod Can, MD;  [facility];  Service: Orthopedics;  Laterality: Right;  Needs RNFA  . MASTECTOMY, PARTIAL     right breast  . MENISCUS REPAIR     right knee  . OOPHORECTOMY      There were no vitals filed for this visit.      Subjective Assessment - 01/08/17 1651    Subjective Patient arrives stating she is doing well, she has tried to stick to ice more and felt good after last  session. HEP still hurts a little but is getting better    Pertinent History HTN, DM, chronic venous insufficiency, B TKR    Patient Stated Goals improve ROM and strength, get back to PLOF    Currently in Pain? Yes   Pain Score 3    Pain Location Knee   Pain Orientation Right                         [facility] Adult PT Treatment/Exercise - 01/08/17 0001      Knee/Hip Exercises: Stretches   Active Hamstring Stretch Right;3 reps;30 seconds   Active Hamstring Stretch Limitations 12 inch box    Gastroc Stretch 3 reps;30 seconds   Gastroc Stretch Limitations slantboard      Knee/Hip Exercises: Standing   Heel Raises Both;1 set;10 reps   Heel Raises Limitations heel and toe    Terminal Knee Extension Limitations x20 no TB    Other Standing Knee Exercises lateral weight shifts 1x20 with 2-3 second hold R LE      Knee/Hip Exercises: Supine   Quad Sets Right;1 set;20 reps   Quad Sets Limitations 3 second holds   ice on knee    Heel Slides Right;1 set;15 reps   Straight Leg Raises 2 sets;10 reps;Right ice on knee  Knee/Hip Exercises: Sidelying   Hip ABduction 2 sets;10 reps;Right   Hip ABduction Limitations R LE  Ice on knee      Knee/Hip Exercises: Prone   Hip Extension 2 sets;10 reps;Right   Hip Extension Limitations R LE  Ice on knee      Manual Therapy   Manual Therapy Edema management;Joint mobilization   Manual therapy comments performed separately of all other skilled services    Edema Management retrograde massage with LEs elevated    Joint Mobilization gentle patella mobilizations grade I-II                 PT Education - 01/08/17 1732    Education provided Yes   Education Details continue to keep working on regular icing program at home at least 3x/day    Person(s) Educated Patient   Methods Explanation   Comprehension Verbalized understanding          PT Short Term Goals - 01/02/17 1745      PT SHORT TERM GOAL #1   Title Patient to  demonstrate R knee ROM as being 0-115 degrees in order to reduce pain and improve mobility    Time 3   Period Weeks   Status New     PT SHORT TERM GOAL #2   Title Patient to be independent in scar massage, patella mobilizations, edema control in order to facilitate self-efficacy in managing condition    Time 3   Period Weeks   Status New     PT SHORT TERM GOAL #3   Title Patient to be independent in correctly and consistently performing appropriate HEP, to be updated PRN    Time 3   Period Weeks   Status New           PT Long Term Goals - 01/02/17 1747      PT LONG TERM GOAL #1   Title Patient to demonstrate functional strength 5/5 in order to improve gait and reduce pain    Time 6   Period Weeks   Status New     PT LONG TERM GOAL #2   Title Patient to be able to ambulate 721ft during 3MWT in order to improve community access and overall mobiltiy    Time 6   Period Weeks   Status New     PT LONG TERM GOAL #3   Title Patient to be able to reciprocally ascend/descend full flight of staris with good eccentric control and minimal unsteadiness in order to enhance home and community access    Time 6   Period Weeks   Status New     PT LONG TERM GOAL #4   Title Patient to report she has been able to work in her garden and sleep without pain in order to improve general QOL    Time 6   Period Weeks   Status New               Plan - 01/08/17 1734    Clinical Impression Statement Continued with manual to address significant localized edema and also continued with gentle patella mobilizations; currently avoiding scar/incision mobility until this area has fully healed. Performed knee mobility ther ex and hip strength today with ice pack applied to the knee to assist in pain and edema control this session. Patient appears to be progressing with PT however continues to be somewhat pain limited during sessions, will continue to monitor with expectation that this will improve  as acuity of knee  surgery reduces.    Rehab Potential Good   Clinical Impairments Affecting Rehab Potential (+) high PLOF, past success with TKR/past PT    PT Frequency 3x / week   PT Duration 6 weeks   PT Treatment/Interventions ADLs/Self Care Home Management;Biofeedback;Cryotherapy;Moist Heat;DME Instruction;Gait training;Stair training;Functional mobility training;Therapeutic activities;Therapeutic exercise;Balance training;Neuromuscular re-education;Patient/family education;Manual techniques;Scar mobilization;Passive range of motion;Energy conservation   PT Next Visit Plan DO NOT USE CUFF WEIGHTS OR CYBEX MACHINES FOR STRENGTH UNTIL ROM IS 0-115 DEGREES. Encourage regular icing. Monitor incision. Gait and balance training.    PT Home Exercise Plan Eval: quad sets, self knee extension overpressure, knee flexion stretch, walking program    Consulted and Agree with Plan of Care Patient      Patient will benefit from skilled therapeutic intervention in order to improve the following deficits and impairments:  Abnormal gait, Decreased skin integrity, Pain, Decreased coordination, Decreased mobility, Decreased range of motion, Decreased strength, Decreased activity tolerance, Decreased balance, Difficulty walking, Impaired flexibility  Visit Diagnosis: Stiffness of right knee, not elsewhere classified  Localized edema  Difficulty in walking, not elsewhere classified  Muscle weakness (generalized)     Problem List Patient Active Problem List   Diagnosis Date Noted  . Osteoarthritis of right knee 12/14/2016  . Primary osteoarthritis of left knee 06/29/2016  . Diabetes mellitus type 2, uncomplicated ([place]) 53/79/4327  . GERD (gastroesophageal reflux disease) 04/16/2015  . Constipation 04/16/2015  . Chronic venous insufficiency 02/10/2015  . Osteoarthritis 02/10/2015  . Obesity 02/10/2015  . OSA (obstructive sleep apnea) 09/05/2012  . Essential hypertension, benign 12/31/2011    Deniece Ree PT, DPT [place] [place] [address] [place], [place], [address] Phone: [phone]   Fax:  [phone]  Name: SADONNA KOTARA MRN: [id number] Date of Birth: 1956/06/21

## 2017-01-10 ENCOUNTER — Ambulatory Visit (HOSPITAL_COMMUNITY): Payer: Managed Care, Other (non HMO) | Admitting: Physical Therapy

## 2017-01-10 DIAGNOSIS — M25661 Stiffness of right knee, not elsewhere classified: Secondary | ICD-10-CM

## 2017-01-10 DIAGNOSIS — M6281 Muscle weakness (generalized): Secondary | ICD-10-CM

## 2017-01-10 DIAGNOSIS — R262 Difficulty in walking, not elsewhere classified: Secondary | ICD-10-CM

## 2017-01-10 DIAGNOSIS — R600 Localized edema: Secondary | ICD-10-CM

## 2017-01-10 NOTE — Therapy (Signed)
[place], [place], [address] Phone: [phone]   Fax:  [phone]  Physical Therapy Treatment  Patient Details  Name: [name] MRN: [id number] Date of Birth: Aug 01, 1956 Referring Provider: Rod Can   Encounter Date: 01/10/2017      PT End of Session - 01/10/17 1653    Visit Number 4   Number of Visits 19   Date for PT Re-Evaluation 01/23/17   Authorization Type [employer] Managed (20 visit limit)   Authorization Time Period 01/02/17 to 02/13/17   Authorization - Visit Number 4   Authorization - Number of Visits 20   PT Start Time 9024   PT Stop Time 1512   PT Time Calculation (min) 39 min   Activity Tolerance Patient tolerated treatment well   Behavior During Therapy [facility] for tasks assessed/performed      Past Medical History:  Diagnosis Date  . Breast cancer [facility]) 2011   right breast with axillary lymph node removal  . Diabetes mellitus, type II ([place])    type 2  . GERD (gastroesophageal reflux disease)   . Hypertension   . Osteoarthritis of both knees   . Pneumonia    in early 1980's    Past Surgical History:  Procedure Laterality Date  . ABDOMINAL HYSTERECTOMY    . APPENDECTOMY  1978  . BLADDER SUSPENSION    . KNEE ARTHROPLASTY Left 06/29/2016   Procedure: LEFT TOTAL KNEE WITH NAVIGATION;  Surgeon: Rod Can, MD;  [facility];  Service: Orthopedics;  Laterality: Left;  . KNEE ARTHROPLASTY Right 12/14/2016   Procedure: RIGHT TOTAL KNEE ARTHROPLASTY WITH COMPUTER NAVIGATION;  Surgeon: Rod Can, MD;  [facility];  Service: Orthopedics;  Laterality: Right;  Needs RNFA  . MASTECTOMY, PARTIAL     right breast  . MENISCUS REPAIR     right knee  . OOPHORECTOMY      There were no vitals filed for this visit.      Subjective Assessment - 01/10/17 1656    Subjective Patient arrives stating she is doing well, she has tried to stick to ice more and felt good after last  session. HEP still hurts a little but is getting better    Pertinent History HTN, DM, chronic venous insufficiency, B TKR    Patient Stated Goals improve ROM and strength, get back to PLOF    Currently in Pain? Yes   Pain Score 2    Pain Location Knee   Pain Orientation Right            [facility] PT Assessment - 01/10/17 0001      Observation/Other Assessments   Observations proxoimal half of incision is closed/completely healed; distal half of incision looks mostly healed but appears to have a couple of fragile spots, recommend covering with bandage during PT today and keep an eye on it/cover if it starts to drain      AROM   Right Knee Flexion 111                     [facility] Adult PT Treatment/Exercise - 01/10/17 0001      Knee/Hip Exercises: Stretches   Active Hamstring Stretch Right;3 reps;30 seconds   Active Hamstring Stretch Limitations 12 inch box    Knee: Self-Stretch to increase Flexion Right;5 reps;10 seconds   Gastroc Stretch 3 reps;30 seconds   Gastroc Stretch Limitations slantboard      Knee/Hip Exercises: Supine   Quad  Sets Right;1 set;15 reps   Quad Sets Limitations 5 second hold   ice    Heel Slides Right;1 set;20 reps   Straight Leg Raises 2 sets;15 reps   Straight Leg Raises Limitations ice      Knee/Hip Exercises: Sidelying   Hip ABduction 2 sets;15 reps   Hip ABduction Limitations R LE      Knee/Hip Exercises: Prone   Hip Extension 2 sets;15 reps   Hip Extension Limitations R LE      Manual Therapy   Manual Therapy Edema management   Manual therapy comments performed separately of all other skilled services    Edema Management retrograde massage with LEs elevated                 PT Education - 01/10/17 1652    Education provided Yes   Education Details continue regular icing, keep an eye on distal portion of incision, cover it if it starts draining    Person(s) Educated Patient   Methods Explanation   Comprehension  Verbalized understanding          PT Short Term Goals - 01/02/17 1745      PT SHORT TERM GOAL #1   Title Patient to demonstrate R knee ROM as being 0-115 degrees in order to reduce pain and improve mobility    Time 3   Period Weeks   Status New     PT SHORT TERM GOAL #2   Title Patient to be independent in scar massage, patella mobilizations, edema control in order to facilitate self-efficacy in managing condition    Time 3   Period Weeks   Status New     PT SHORT TERM GOAL #3   Title Patient to be independent in correctly and consistently performing appropriate HEP, to be updated PRN    Time 3   Period Weeks   Status New           PT Long Term Goals - 01/02/17 1747      PT LONG TERM GOAL #1   Title Patient to demonstrate functional strength 5/5 in order to improve gait and reduce pain    Time 6   Period Weeks   Status New     PT LONG TERM GOAL #2   Title Patient to be able to ambulate 775ft during 3MWT in order to improve community access and overall mobiltiy    Time 6   Period Weeks   Status New     PT LONG TERM GOAL #3   Title Patient to be able to reciprocally ascend/descend full flight of staris with good eccentric control and minimal unsteadiness in order to enhance home and community access    Time 6   Period Weeks   Status New     PT LONG TERM GOAL #4   Title Patient to report she has been able to work in her garden and sleep without pain in order to improve general QOL    Time 6   Period Weeks   Status New               Plan - 01/10/17 1654    Clinical Impression Statement Patient arrives without bandage over incision; proximal half appears well healed but distal half has some areas that look like they are still healing- covered this area with gauze/tape today and recommended that patient keep an eye on these areas at home. Continued with overpressure for knee extension, appear to be reaching close  to 0 degrees passively however note ongoing  extension lag with SLRs today. Continued performing exercise with ice today to assist in edema control throughout selected exercises today. Patient appears to be progressing well in general with ongoing skilled PT services.   Rehab Potential Good   Clinical Impairments Affecting Rehab Potential (+) high PLOF, past success with TKR/past PT    PT Frequency 3x / week   PT Duration 6 weeks   PT Treatment/Interventions ADLs/Self Care Home Management;Biofeedback;Cryotherapy;Moist Heat;DME Instruction;Gait training;Stair training;Functional mobility training;Therapeutic activities;Therapeutic exercise;Balance training;Neuromuscular re-education;Patient/family education;Manual techniques;Scar mobilization;Passive range of motion;Energy conservation   PT Next Visit Plan DO NOT USE CUFF WEIGHTS OR CYBEX MACHINES FOR STRENGTH UNTIL ROM IS 0-115 DEGREES. Use ice pack on knee for quad sets/SLRS/ABD/etc. Retro massage. Increase time spent in [facility] pre-gait/gait training tasks.    PT Home Exercise Plan Eval: quad sets, self knee extension overpressure, knee flexion stretch, walking program    Consulted and Agree with Plan of Care Patient      Patient will benefit from skilled therapeutic intervention in order to improve the following deficits and impairments:  Abnormal gait, Decreased skin integrity, Pain, Decreased coordination, Decreased mobility, Decreased range of motion, Decreased strength, Decreased activity tolerance, Decreased balance, Difficulty walking, Impaired flexibility  Visit Diagnosis: Stiffness of right knee, not elsewhere classified  Localized edema  Difficulty in walking, not elsewhere classified  Muscle weakness (generalized)     Problem List Patient Active Problem List   Diagnosis Date Noted  . Osteoarthritis of right knee 12/14/2016  . Primary osteoarthritis of left knee 06/29/2016  . Diabetes mellitus type 2, uncomplicated ([place]) 67/09/4579  . GERD (gastroesophageal reflux  disease) 04/16/2015  . Constipation 04/16/2015  . Chronic venous insufficiency 02/10/2015  . Osteoarthritis 02/10/2015  . Obesity 02/10/2015  . OSA (obstructive sleep apnea) 09/05/2012  . Essential hypertension, benign 12/31/2011    Deniece Ree PT, DPT [place] [address] [place], [place], [address] Phone: [phone]   Fax:  [phone]  Name: [name] MRN: [id number] Date of Birth: 1956-08-14

## 2017-01-11 ENCOUNTER — Ambulatory Visit (HOSPITAL_COMMUNITY): Payer: Managed Care, Other (non HMO) | Admitting: Physical Therapy

## 2017-01-11 DIAGNOSIS — M25661 Stiffness of right knee, not elsewhere classified: Secondary | ICD-10-CM | POA: Diagnosis not present

## 2017-01-11 DIAGNOSIS — R262 Difficulty in walking, not elsewhere classified: Secondary | ICD-10-CM

## 2017-01-11 DIAGNOSIS — M6281 Muscle weakness (generalized): Secondary | ICD-10-CM

## 2017-01-11 DIAGNOSIS — R600 Localized edema: Secondary | ICD-10-CM

## 2017-01-11 NOTE — Therapy (Signed)
[name], [place], [address] Phone: [phone]   Fax:  [phone]  Physical Therapy Treatment  Patient Details  Name: [name] MRN: [id number] Date of Birth: Jul 16, 1956 Referring Provider: Rod Can   Encounter Date: 01/11/2017      PT End of Session - 01/11/17 1538    Visit Number 5   Number of Visits 19   Date for PT Re-Evaluation 01/23/17   Authorization Type [employer] Managed (20 visit limit)   Authorization Time Period 01/02/17 to 02/13/17   Authorization - Visit Number 5   Authorization - Number of Visits 20   PT Start Time 5027   PT Stop Time 1522   PT Time Calculation (min) 44 min   Activity Tolerance Patient tolerated treatment well   Behavior During Therapy [facility] for tasks assessed/performed      Past Medical History:  Diagnosis Date  . Breast cancer [facility]) 2011   right breast with axillary lymph node removal  . Diabetes mellitus, type II ([place])    type 2  . GERD (gastroesophageal reflux disease)   . Hypertension   . Osteoarthritis of both knees   . Pneumonia    in early 1980's    Past Surgical History:  Procedure Laterality Date  . ABDOMINAL HYSTERECTOMY    . APPENDECTOMY  1978  . BLADDER SUSPENSION    . KNEE ARTHROPLASTY Left 06/29/2016   Procedure: LEFT TOTAL KNEE WITH NAVIGATION;  Surgeon: Rod Can, MD;  Location: [facility];  Service: Orthopedics;  Laterality: Left;  . KNEE ARTHROPLASTY Right 12/14/2016   Procedure: RIGHT TOTAL KNEE ARTHROPLASTY WITH COMPUTER NAVIGATION;  Surgeon: Rod Can, MD;  Location: [facility];  Service: Orthopedics;  Laterality: Right;  Needs RNFA  . MASTECTOMY, PARTIAL     right breast  . MENISCUS REPAIR     right knee  . OOPHORECTOMY      There were no vitals filed for this visit.      Subjective Assessment - 01/10/17 1656    Subjective Patient arrives stating she is doing well, she has tried to stick to ice more and felt good after last  session. HEP still hurts a little but is getting better    Pertinent History HTN, DM, chronic venous insufficiency, B TKR    Patient Stated Goals improve ROM and strength, get back to PLOF    Currently in Pain? Yes   Pain Score 2    Pain Location Knee   Pain Orientation Right                         [facility] Adult PT Treatment/Exercise - 01/11/17 0001      Knee/Hip Exercises: Stretches   Active Hamstring Stretch Right;3 reps;30 seconds   Active Hamstring Stretch Limitations 12 inch box    Knee: Self-Stretch to increase Flexion Right;10 seconds   Knee: Self-Stretch Limitations 10 reps on 12" step   Gastroc Stretch 3 reps;30 seconds   Gastroc Stretch Limitations slantboard      Knee/Hip Exercises: Standing   Heel Raises Both;1 set;10 reps   Heel Raises Limitations heel and toe      Knee/Hip Exercises: Supine   Quad Sets Right;1 set;15 reps   Heel Slides Right;1 set;20 reps   Straight Leg Raises 2 sets;15 reps   Knee Extension Limitations 5   Knee Flexion Limitations 107     Knee/Hip Exercises: Sidelying   Hip ABduction 2 sets;15  reps   Hip ABduction Limitations Rt LE      Knee/Hip Exercises: Prone   Hamstring Curl 2 sets;15 reps   Hamstring Curl Limitations Rt LE   Hip Extension 2 sets;15 reps   Hip Extension Limitations Rt LE      Manual Therapy   Manual Therapy Edema management   Manual therapy comments performed separately of all other skilled services    Edema Management retrograde massage with LEs elevated                 PT Education - 01/10/17 1652    Education provided Yes   Education Details continue regular icing, keep an eye on distal portion of incision, cover it if it starts draining    Person(s) Educated Patient   Methods Explanation   Comprehension Verbalized understanding          PT Short Term Goals - 01/02/17 1745      PT SHORT TERM GOAL #1   Title Patient to demonstrate R knee ROM as being 0-115 degrees in order to  reduce pain and improve mobility    Time 3   Period Weeks   Status New     PT SHORT TERM GOAL #2   Title Patient to be independent in scar massage, patella mobilizations, edema control in order to facilitate self-efficacy in managing condition    Time 3   Period Weeks   Status New     PT SHORT TERM GOAL #3   Title Patient to be independent in correctly and consistently performing appropriate HEP, to be updated PRN    Time 3   Period Weeks   Status New           PT Long Term Goals - 01/02/17 1747      PT LONG TERM GOAL #1   Title Patient to demonstrate functional strength 5/5 in order to improve gait and reduce pain    Time 6   Period Weeks   Status New     PT LONG TERM GOAL #2   Title Patient to be able to ambulate 769ft during 3MWT in order to improve community access and overall mobiltiy    Time 6   Period Weeks   Status New     PT LONG TERM GOAL #3   Title Patient to be able to reciprocally ascend/descend full flight of staris with good eccentric control and minimal unsteadiness in order to enhance home and community access    Time 6   Period Weeks   Status New     PT LONG TERM GOAL #4   Title Patient to report she has been able to work in her garden and sleep without pain in order to improve general QOL    Time 6   Period Weeks   Status New               Plan - 01/11/17 1539    Clinical Impression Statement Continued with Rt knee ROM focus.  Encourged patient to continue to focus on this as well.  REcommended trial of compression garments if swelling continued in bilateral LE's.  Pt with increased stiffness this session only achieving 5-107 in Rt knee.  Pt to monitor incision and notify MD if worsens. Pt without increased pain at end of session.   Rehab Potential Good   Clinical Impairments Affecting Rehab Potential (+) high PLOF, past success with TKR/past PT    PT Frequency 3x / week   PT  Duration 6 weeks   PT Treatment/Interventions ADLs/Self Care  Home Management;Biofeedback;Cryotherapy;Moist Heat;DME Instruction;Gait training;Stair training;Functional mobility training;Therapeutic activities;Therapeutic exercise;Balance training;Neuromuscular re-education;Patient/family education;Manual techniques;Scar mobilization;Passive range of motion;Energy conservation   PT Next Visit Plan DO NOT USE CUFF WEIGHTS OR CYBEX MACHINES FOR STRENGTH UNTIL ROM IS 0-115 DEGREES. Use ice pack on knee for quad sets/SLRS/ABD/etc. Retro massage. Increase time spent in [facility] pre-gait/gait training tasks.   Next session work on heel to toe gait, SLS and functional squats.    PT Home Exercise Plan Eval: quad sets, self knee extension overpressure, knee flexion stretch, walking program    Consulted and Agree with Plan of Care Patient      Patient will benefit from skilled therapeutic intervention in order to improve the following deficits and impairments:  Abnormal gait, Decreased skin integrity, Pain, Decreased coordination, Decreased mobility, Decreased range of motion, Decreased strength, Decreased activity tolerance, Decreased balance, Difficulty walking, Impaired flexibility  Visit Diagnosis: Stiffness of right knee, not elsewhere classified  Localized edema  Difficulty in walking, not elsewhere classified  Muscle weakness (generalized)     Problem List Patient Active Problem List   Diagnosis Date Noted  . Osteoarthritis of right knee 12/14/2016  . Primary osteoarthritis of left knee 06/29/2016  . Diabetes mellitus type 2, uncomplicated ([place]) 79/48/0165  . GERD (gastroesophageal reflux disease) 04/16/2015  . Constipation 04/16/2015  . Chronic venous insufficiency 02/10/2015  . Osteoarthritis 02/10/2015  . Obesity 02/10/2015  . OSA (obstructive sleep apnea) 09/05/2012  . Essential hypertension, benign 12/31/2011    Teena Irani, PTA/CLT [phone]  01/11/2017, 3:42 PM  [place], [place], [address] Phone: [phone]   Fax:  [phone]  Name: JNIYA MADARA MRN: [id number] Date of Birth: Apr 29, 1956

## 2017-01-15 ENCOUNTER — Ambulatory Visit (HOSPITAL_COMMUNITY): Payer: Managed Care, Other (non HMO) | Admitting: Physical Therapy

## 2017-01-15 DIAGNOSIS — R262 Difficulty in walking, not elsewhere classified: Secondary | ICD-10-CM

## 2017-01-15 DIAGNOSIS — R600 Localized edema: Secondary | ICD-10-CM

## 2017-01-15 DIAGNOSIS — M25661 Stiffness of right knee, not elsewhere classified: Secondary | ICD-10-CM | POA: Diagnosis not present

## 2017-01-15 DIAGNOSIS — M25562 Pain in left knee: Secondary | ICD-10-CM

## 2017-01-15 DIAGNOSIS — M6281 Muscle weakness (generalized): Secondary | ICD-10-CM

## 2017-01-15 DIAGNOSIS — R601 Generalized edema: Secondary | ICD-10-CM

## 2017-01-15 NOTE — Therapy (Signed)
[place], [place], [address] Phone: [phone]   Fax:  [phone]  Physical Therapy Treatment  Patient Details  Name: [name] MRN: [id number] Date of Birth: Jul 06, 1956 Referring Provider: Rod Can   Encounter Date: 01/15/2017      PT End of Session - 01/15/17 1726    Visit Number 6   Number of Visits 19   Date for PT Re-Evaluation 01/23/17   Authorization Type [employer] Managed (20 visit limit)   Authorization Time Period 01/02/17 to 02/13/17   Authorization - Visit Number 6   Authorization - Number of Visits 20   PT Start Time 7062   PT Stop Time 1730   PT Time Calculation (min) 40 min   Activity Tolerance Patient tolerated treatment well   Behavior During Therapy [facility] for tasks assessed/performed      Past Medical History:  Diagnosis Date  . Breast cancer [facility]) 2011   right breast with axillary lymph node removal  . Diabetes mellitus, type II ([place])    type 2  . GERD (gastroesophageal reflux disease)   . Hypertension   . Osteoarthritis of both knees   . Pneumonia    in early 1980's    Past Surgical History:  Procedure Laterality Date  . ABDOMINAL HYSTERECTOMY    . APPENDECTOMY  1978  . BLADDER SUSPENSION    . KNEE ARTHROPLASTY Left 06/29/2016   Procedure: LEFT TOTAL KNEE WITH NAVIGATION;  Surgeon: Rod Can, MD;  [facility];  Service: Orthopedics;  Laterality: Left;  . KNEE ARTHROPLASTY Right 12/14/2016   Procedure: RIGHT TOTAL KNEE ARTHROPLASTY WITH COMPUTER NAVIGATION;  Surgeon: Rod Can, MD;  [facility];  Service: Orthopedics;  Laterality: Right;  Needs RNFA  . MASTECTOMY, PARTIAL     right breast  . MENISCUS REPAIR     right knee  . OOPHORECTOMY      There were no vitals filed for this visit.      Subjective Assessment - 01/15/17 1651    Subjective Pt states she has some tightness superior knee.  No real pain just achiness and soreness she's  attributing to the weather (cold and damp).   Currently in Pain? No/denies                         [facility] Adult PT Treatment/Exercise - 01/15/17 0001      Knee/Hip Exercises: Stretches   Active Hamstring Stretch Right;3 reps;30 seconds   Active Hamstring Stretch Limitations 12 inch box    Knee: Self-Stretch to increase Flexion Right;10 seconds   Knee: Self-Stretch Limitations 10 reps on 12" step   Gastroc Stretch 3 reps;30 seconds   Gastroc Stretch Limitations slantboard      Knee/Hip Exercises: Standing   Heel Raises Both;1 set;15 reps   Heel Raises Limitations heel and toe      Knee/Hip Exercises: Supine   Quad Sets Right;1 set;15 reps   Straight Leg Raises 2 sets;15 reps   Knee Extension Limitations 3   Knee Flexion Limitations 107, 112     Knee/Hip Exercises: Sidelying   Hip ABduction 2 sets;15 reps   Hip ABduction Limitations Rt LE      Knee/Hip Exercises: Prone   Hamstring Curl 2 sets;15 reps   Hamstring Curl Limitations Rt LE   Hip Extension 2 sets;15 reps   Hip Extension Limitations Rt LE      Manual Therapy   Manual Therapy  Edema management   Manual therapy comments Completed first, performed separately of all other skilled services    Edema Management retrograde massage with LEs elevated                   PT Short Term Goals - 01/02/17 1745      PT SHORT TERM GOAL #1   Title Patient to demonstrate R knee ROM as being 0-115 degrees in order to reduce pain and improve mobility    Time 3   Period Weeks   Status New     PT SHORT TERM GOAL #2   Title Patient to be independent in scar massage, patella mobilizations, edema control in order to facilitate self-efficacy in managing condition    Time 3   Period Weeks   Status New     PT SHORT TERM GOAL #3   Title Patient to be independent in correctly and consistently performing appropriate HEP, to be updated PRN    Time 3   Period Weeks   Status New           PT Long Term  Goals - 01/02/17 1747      PT LONG TERM GOAL #1   Title Patient to demonstrate functional strength 5/5 in order to improve gait and reduce pain    Time 6   Period Weeks   Status New     PT LONG TERM GOAL #2   Title Patient to be able to ambulate 771ft during 3MWT in order to improve community access and overall mobiltiy    Time 6   Period Weeks   Status New     PT LONG TERM GOAL #3   Title Patient to be able to reciprocally ascend/descend full flight of staris with good eccentric control and minimal unsteadiness in order to enhance home and community access    Time 6   Period Weeks   Status New     PT LONG TERM GOAL #4   Title Patient to report she has been able to work in her garden and sleep without pain in order to improve general QOL    Time 6   Period Weeks   Status New               Plan - 01/15/17 1727    Clinical Impression Statement Completed manual to Rt knee at beginning of session due to c/o tightness superior patella.  Noted was tight knot and adhesed tissue with ability to loosen with myofascial techniques.  Also completed massage to Rt knee to decrease swelling.  3-112 degrees achieved following this.  continued with gentle stretches and began SLS actvities.  Pt able to balance Lt: 40 seconds and Rt: 26".  Incision is also healed with good mobility over scar.    Rehab Potential Good   Clinical Impairments Affecting Rehab Potential (+) high PLOF, past success with TKR/past PT    PT Frequency 3x / week   PT Duration 6 weeks   PT Treatment/Interventions ADLs/Self Care Home Management;Biofeedback;Cryotherapy;Moist Heat;DME Instruction;Gait training;Stair training;Functional mobility training;Therapeutic activities;Therapeutic exercise;Balance training;Neuromuscular re-education;Patient/family education;Manual techniques;Scar mobilization;Passive range of motion;Energy conservation   PT Next Visit Plan DO NOT USE CUFF WEIGHTS OR CYBEX MACHINES FOR STRENGTH UNTIL  ROM IS 0-115 DEGREES. Continue with manual to loosen scar tissue and massage to decrease edema.  Increase time spent in [facility] pre-gait/gait training tasks.   Next session add vectors and functional squats.    PT Home Exercise Plan Eval: quad sets,  self knee extension overpressure, knee flexion stretch, walking program    Consulted and Agree with Plan of Care Patient      Patient will benefit from skilled therapeutic intervention in order to improve the following deficits and impairments:  Abnormal gait, Decreased skin integrity, Pain, Decreased coordination, Decreased mobility, Decreased range of motion, Decreased strength, Decreased activity tolerance, Decreased balance, Difficulty walking, Impaired flexibility  Visit Diagnosis: Stiffness of right knee, not elsewhere classified  Localized edema  Difficulty in walking, not elsewhere classified  Muscle weakness (generalized)  Acute pain of left knee  Generalized edema     Problem List Patient Active Problem List   Diagnosis Date Noted  . Osteoarthritis of right knee 12/14/2016  . Primary osteoarthritis of left knee 06/29/2016  . Diabetes mellitus type 2, uncomplicated ([place]) 08/01/5944  . GERD (gastroesophageal reflux disease) 04/16/2015  . Constipation 04/16/2015  . Chronic venous insufficiency 02/10/2015  . Osteoarthritis 02/10/2015  . Obesity 02/10/2015  . OSA (obstructive sleep apnea) 09/05/2012  . Essential hypertension, benign 12/31/2011    Teena Irani, PTA/CLT [phone]  01/15/2017, 5:41 PM  [place] [address] [place], [place], [address] Phone: [phone]   Fax:  [phone]  Name: FOREVER ARECHIGA MRN: [id number] Date of Birth: 01/07/1956

## 2017-01-17 ENCOUNTER — Ambulatory Visit (HOSPITAL_COMMUNITY): Payer: Managed Care, Other (non HMO)

## 2017-01-17 DIAGNOSIS — M6281 Muscle weakness (generalized): Secondary | ICD-10-CM

## 2017-01-17 DIAGNOSIS — R262 Difficulty in walking, not elsewhere classified: Secondary | ICD-10-CM

## 2017-01-17 DIAGNOSIS — M25661 Stiffness of right knee, not elsewhere classified: Secondary | ICD-10-CM

## 2017-01-17 DIAGNOSIS — R600 Localized edema: Secondary | ICD-10-CM

## 2017-01-17 NOTE — Therapy (Signed)
[place], [place], [address] Phone: [phone]   Fax:  [phone]  Physical Therapy Treatment  Patient Details  Name: [name] MRN: [id number] Date of Birth: [birth year] Referring Provider: Rod Can   Encounter Date: 01/17/2017      PT End of Session - 01/17/17 1701    Visit Number 7   Number of Visits 19   Date for PT Re-Evaluation 01/23/17   Authorization Type [employer] Managed (20 visit limit)   Authorization Time Period 01/02/17 to 02/13/17   Authorization - Visit Number 7   Authorization - Number of Visits 20   PT Start Time 2751   PT Stop Time 7001   PT Time Calculation (min) 44 min   Activity Tolerance Patient tolerated treatment well;No increased pain   Behavior During Therapy [facility] for tasks assessed/performed      Past Medical History:  Diagnosis Date  . Breast cancer [facility]) 2011   right breast with axillary lymph node removal  . Diabetes mellitus, type II ([place])    type 2  . GERD (gastroesophageal reflux disease)   . Hypertension   . Osteoarthritis of both knees   . Pneumonia    in early 1980's    Past Surgical History:  Procedure Laterality Date  . ABDOMINAL HYSTERECTOMY    . APPENDECTOMY  1978  . BLADDER SUSPENSION    . KNEE ARTHROPLASTY Left 06/29/2016   Procedure: LEFT TOTAL KNEE WITH NAVIGATION;  Surgeon: Rod Can, MD;  [facility];  Service: Orthopedics;  Laterality: Left;  . KNEE ARTHROPLASTY Right 12/14/2016   Procedure: RIGHT TOTAL KNEE ARTHROPLASTY WITH COMPUTER NAVIGATION;  Surgeon: Rod Can, MD;  [facility];  Service: Orthopedics;  Laterality: Right;  Needs RNFA  . MASTECTOMY, PARTIAL     right breast  . MENISCUS REPAIR     right knee  . OOPHORECTOMY      There were no vitals filed for this visit.      Subjective Assessment - 01/17/17 1650    Subjective Pt stated she has been on feet a lot today with increased pain Rt knee, burning  sensation around knee.  Current pain scale 4/10   Pertinent History HTN, DM, chronic venous insufficiency, B TKR    Patient Stated Goals improve ROM and strength, get back to PLOF    Currently in Pain? Yes   Pain Score 4    Pain Location Knee   Pain Orientation Right   Pain Descriptors / Indicators Burning;Throbbing;Pressure   Pain Type Surgical pain   Pain Radiating Towards none   Pain Onset 1 to 4 weeks ago   Pain Frequency Constant   Aggravating Factors  sitting with leg down, bending knee too far   Pain Relieving Factors elevation, exercises   Effect of Pain on Daily Activities severe impact             [facility] Adult PT Treatment/Exercise - 01/17/17 0001      Knee/Hip Exercises: Stretches   Active Hamstring Stretch Right;3 reps;30 seconds   Active Hamstring Stretch Limitations supine with rope   Knee: Self-Stretch to increase Flexion Right;10 seconds   Knee: Self-Stretch Limitations 10 reps on 12" step   Gastroc Stretch 3 reps;30 seconds   Gastroc Stretch Limitations slantboard      Knee/Hip Exercises: Standing   Heel Raises Both;1 set;15 reps   Heel Raises Limitations heel and toe incline slope   Functional Squat 10 reps  Rocker Board 2 minutes   Rocker Board Limitations R/L   SLS Lt 54"; Rt 19" max of 3   SLS with Vectors 3x 5"     Knee/Hip Exercises: Supine   Quad Sets Right;1 set;15 reps   Short Arc Quad Sets 15 reps   Heel Slides 15 reps   Knee Extension Limitations 3   Knee Flexion Limitations 112     Knee/Hip Exercises: Sidelying   Hip ABduction 20 reps   Hip ABduction Limitations Rt LE      Knee/Hip Exercises: Prone   Hip Extension 20 reps   Hip Extension Limitations Rt LE      Manual Therapy   Manual Therapy Edema management;Myofascial release   Manual therapy comments Completed first, performed separately of all other skilled services    Edema Management retrograde massage with LEs elevated    Joint Mobilization gentle patella mobilizations  grade I-II    Myofascial Release scar tissue massage                  PT Short Term Goals - 01/02/17 1745      PT SHORT TERM GOAL #1   Title Patient to demonstrate R knee ROM as being 0-115 degrees in order to reduce pain and improve mobility    Time 3   Period Weeks   Status New     PT SHORT TERM GOAL #2   Title Patient to be independent in scar massage, patella mobilizations, edema control in order to facilitate self-efficacy in managing condition    Time 3   Period Weeks   Status New     PT SHORT TERM GOAL #3   Title Patient to be independent in correctly and consistently performing appropriate HEP, to be updated PRN    Time 3   Period Weeks   Status New           PT Long Term Goals - 01/02/17 1747      PT LONG TERM GOAL #1   Title Patient to demonstrate functional strength 5/5 in order to improve gait and reduce pain    Time 6   Period Weeks   Status New     PT LONG TERM GOAL #2   Title Patient to be able to ambulate 768ft during 3MWT in order to improve community access and overall mobiltiy    Time 6   Period Weeks   Status New     PT LONG TERM GOAL #3   Title Patient to be able to reciprocally ascend/descend full flight of staris with good eccentric control and minimal unsteadiness in order to enhance home and community access    Time 6   Period Weeks   Status New     PT LONG TERM GOAL #4   Title Patient to report she has been able to work in her garden and sleep without pain in order to improve general QOL    Time 6   Period Weeks   Status New               Plan - 01/17/17 1707    Clinical Impression Statement Began session with manual retro massage, joint mobs and scar tissue mobilization for edema control and to improve knee mobitliy.  Current AROM 3-115 degrees following manual and therex.  Increased focus this session on improving gait mechanics with addition of rocker board and gait training for heel to toe and proper UE swing.   Added functional squats and vector  stance for functional strengthening and stabilty.     Rehab Potential Good   Clinical Impairments Affecting Rehab Potential (+) high PLOF, past success with TKR/past PT    PT Frequency 3x / week   PT Duration 6 weeks   PT Treatment/Interventions ADLs/Self Care Home Management;Biofeedback;Cryotherapy;Moist Heat;DME Instruction;Gait training;Stair training;Functional mobility training;Therapeutic activities;Therapeutic exercise;Balance training;Neuromuscular re-education;Patient/family education;Manual techniques;Scar mobilization;Passive range of motion;Energy conservation   PT Next Visit Plan DO NOT USE CUFF WEIGHTS OR CYBEX MACHINES FOR STRENGTH UNTIL ROM IS 0-115 DEGREES. Continue with manual to loosen scar tissue and massage to decrease edema.  Increase time spent in [facility] pre-gait/gait training tasks.   Continue with stabilty exercises including vectors and functional squats.    PT Home Exercise Plan Eval: quad sets, self knee extension overpressure, knee flexion stretch, walking program       Patient will benefit from skilled therapeutic intervention in order to improve the following deficits and impairments:  Abnormal gait, Decreased skin integrity, Pain, Decreased coordination, Decreased mobility, Decreased range of motion, Decreased strength, Decreased activity tolerance, Decreased balance, Difficulty walking, Impaired flexibility  Visit Diagnosis: Stiffness of right knee, not elsewhere classified  Localized edema  Difficulty in walking, not elsewhere classified  Muscle weakness (generalized)     Problem List Patient Active Problem List   Diagnosis Date Noted  . Osteoarthritis of right knee 12/14/2016  . Primary osteoarthritis of left knee 06/29/2016  . Diabetes mellitus type 2, uncomplicated ([place]) 26/37/8588  . GERD (gastroesophageal reflux disease) 04/16/2015  . Constipation 04/16/2015  . Chronic venous insufficiency 02/10/2015  .  Osteoarthritis 02/10/2015  . Obesity 02/10/2015  . OSA (obstructive sleep apnea) 09/05/2012  . Essential hypertension, benign 12/31/2011   Ihor Austin, [place]; [place]  Aldona Lento 01/17/2017, 6:22 PM  [place] [address] [place], [place], [address] Phone: [phone]   Fax:  [phone]  Name: TAZARIA DLUGOSZ MRN: [id number] Date of Birth: 1956-07-21

## 2017-01-19 ENCOUNTER — Ambulatory Visit (HOSPITAL_COMMUNITY): Payer: Managed Care, Other (non HMO) | Admitting: Physical Therapy

## 2017-01-19 DIAGNOSIS — M6281 Muscle weakness (generalized): Secondary | ICD-10-CM

## 2017-01-19 DIAGNOSIS — R262 Difficulty in walking, not elsewhere classified: Secondary | ICD-10-CM

## 2017-01-19 DIAGNOSIS — M25661 Stiffness of right knee, not elsewhere classified: Secondary | ICD-10-CM | POA: Diagnosis not present

## 2017-01-19 DIAGNOSIS — R600 Localized edema: Secondary | ICD-10-CM

## 2017-01-19 NOTE — Therapy (Signed)
[place], [place], [address] Phone: [phone]   Fax:  [phone]  Physical Therapy Treatment  Patient Details  Name: [name] MRN: [id number] Date of Birth: [birth year] Referring Provider: Rod Can   Encounter Date: 01/19/2017      PT End of Session - 01/19/17 1359    Visit Number 8   Number of Visits 19   Date for PT Re-Evaluation 01/23/17   Authorization Type [employer] Managed (20 visit limit)   Authorization Time Period 01/02/17 to 02/13/17   Authorization - Visit Number 8   Authorization - Number of Visits 20   PT Start Time 2500   PT Stop Time 3704   PT Time Calculation (min) 40 min   Activity Tolerance Patient tolerated treatment well;No increased pain   Behavior During Therapy WFL for tasks assessed/performed      Past Medical History:  Diagnosis Date  . Breast cancer [facility]) 2011   right breast with axillary lymph node removal  . Diabetes mellitus, type II ([place])    type 2  . GERD (gastroesophageal reflux disease)   . Hypertension   . Osteoarthritis of both knees   . Pneumonia    in early 1980's    Past Surgical History:  Procedure Laterality Date  . ABDOMINAL HYSTERECTOMY    . APPENDECTOMY  1978  . BLADDER SUSPENSION    . KNEE ARTHROPLASTY Left 06/29/2016   Procedure: LEFT TOTAL KNEE WITH NAVIGATION;  Surgeon: Rod Can, MD;  Location: [facility];  Service: Orthopedics;  Laterality: Left;  . KNEE ARTHROPLASTY Right 12/14/2016   Procedure: RIGHT TOTAL KNEE ARTHROPLASTY WITH COMPUTER NAVIGATION;  Surgeon: Rod Can, MD;  Location: [facility];  Service: Orthopedics;  Laterality: Right;  Needs RNFA  . MASTECTOMY, PARTIAL     right breast  . MENISCUS REPAIR     right knee  . OOPHORECTOMY      There were no vitals filed for this visit.      Subjective Assessment - 01/19/17 1346    Subjective Pt reports that things are going well. She continues to perform her HEP without  difficulty.    Pertinent History HTN, DM, chronic venous insufficiency, B TKR    Patient Stated Goals improve ROM and strength, get back to PLOF    Currently in Pain? Yes   Pain Score 2    Pain Location Knee   Pain Orientation Right   Pain Descriptors / Indicators Aching   Pain Onset More than a month ago   Pain Frequency Intermittent   Aggravating Factors  just there today    Pain Relieving Factors exercises                          [facility] Adult PT Treatment/Exercise - 01/19/17 0001      Knee/Hip Exercises: Stretches   Knee: Self-Stretch to increase Flexion Right   Knee: Self-Stretch Limitations 10x10 sec hold, 12" box      Knee/Hip Exercises: Standing   Heel Raises Both;1 set;20 reps   Heel Raises Limitations DF on incline x20 reps    Functional Squat 10 reps   Functional Squat Limitations verbal cues to improve technique    Rocker Board 2 minutes   Rocker Board Limitations Lt/Rt   Other Standing Knee Exercises Rt TKE with green TB 2x10 reps      Knee/Hip Exercises: Supine   Bridges with Clamshell Both;1 set;15 reps  Knee Extension Limitations 4   Knee Flexion Limitations 110     Manual Therapy   Manual Therapy Soft tissue mobilization   Manual therapy comments separate rest of session    Soft tissue mobilization STM lateral, distal hamstring (Rt)                 PT Education - 01/19/17 1423    Education provided Yes   Education Details technique with therex   Person(s) Educated Patient   Methods Explanation;Verbal cues;Handout   Comprehension Verbalized understanding          PT Short Term Goals - 01/02/17 1745      PT SHORT TERM GOAL #1   Title Patient to demonstrate R knee ROM as being 0-115 degrees in order to reduce pain and improve mobility    Time 3   Period Weeks   Status New     PT SHORT TERM GOAL #2   Title Patient to be independent in scar massage, patella mobilizations, edema control in order to facilitate  self-efficacy in managing condition    Time 3   Period Weeks   Status New     PT SHORT TERM GOAL #3   Title Patient to be independent in correctly and consistently performing appropriate HEP, to be updated PRN    Time 3   Period Weeks   Status New           PT Long Term Goals - 01/02/17 1747      PT LONG TERM GOAL #1   Title Patient to demonstrate functional strength 5/5 in order to improve gait and reduce pain    Time 6   Period Weeks   Status New     PT LONG TERM GOAL #2   Title Patient to be able to ambulate 774ft during 3MWT in order to improve community access and overall mobiltiy    Time 6   Period Weeks   Status New     PT LONG TERM GOAL #3   Title Patient to be able to reciprocally ascend/descend full flight of staris with good eccentric control and minimal unsteadiness in order to enhance home and community access    Time 6   Period Weeks   Status New     PT LONG TERM GOAL #4   Title Patient to report she has been able to work in her garden and sleep without pain in order to improve general QOL    Time 6   Period Weeks   Status New               Plan - 01/19/17 1424    Clinical Impression Statement Today's session continued with therex and activity to improve knee strength and ROM. Pt able to perform all exercises with minimal corrections for technique. Worked on weight shifting and tandem walk to narrow her stance during ambulation and followed all exercises with STM along the lateral distal hamstring to decrease muscle spasm and tightness. Pt reporting improvements following her session, stating her leg felt more flexible during ambulation.   Rehab Potential Good   Clinical Impairments Affecting Rehab Potential (+) high PLOF, past success with TKR/past PT    PT Frequency 3x / week   PT Duration 6 weeks   PT Treatment/Interventions ADLs/Self Care Home Management;Biofeedback;Cryotherapy;Moist Heat;DME Instruction;Gait training;Stair  training;Functional mobility training;Therapeutic activities;Therapeutic exercise;Balance training;Neuromuscular re-education;Patient/family education;Manual techniques;Scar mobilization;Passive range of motion;Energy conservation   PT Next Visit Plan terminal knee extensor strength, STM along lateral  hamstring; balance activity; Continue with stabilty exercises including vectors and functional squats.    PT Home Exercise Plan Eval: quad sets, self knee extension overpressure, knee flexion stretch, walking program    Consulted and Agree with Plan of Care Patient      Patient will benefit from skilled therapeutic intervention in order to improve the following deficits and impairments:  Abnormal gait, Decreased skin integrity, Pain, Decreased coordination, Decreased mobility, Decreased range of motion, Decreased strength, Decreased activity tolerance, Decreased balance, Difficulty walking, Impaired flexibility  Visit Diagnosis: Stiffness of right knee, not elsewhere classified  Localized edema  Difficulty in walking, not elsewhere classified  Muscle weakness (generalized)     Problem List Patient Active Problem List   Diagnosis Date Noted  . Osteoarthritis of right knee 12/14/2016  . Primary osteoarthritis of left knee 06/29/2016  . Diabetes mellitus type 2, uncomplicated ([place]) 96/43/8381  . GERD (gastroesophageal reflux disease) 04/16/2015  . Constipation 04/16/2015  . Chronic venous insufficiency 02/10/2015  . Osteoarthritis 02/10/2015  . Obesity 02/10/2015  . OSA (obstructive sleep apnea) 09/05/2012  . Essential hypertension, benign 12/31/2011   2:33 PM,01/19/17 Elly Modena PT, DPT [facility] [place] [address] [place], [place], [address] Phone: [phone]   Fax:  [phone]  Name: [name] MRN: [id number] Date of Birth: Feb 05, 1956

## 2017-01-22 ENCOUNTER — Ambulatory Visit (HOSPITAL_COMMUNITY): Payer: Managed Care, Other (non HMO) | Admitting: Physical Therapy

## 2017-01-22 DIAGNOSIS — M25661 Stiffness of right knee, not elsewhere classified: Secondary | ICD-10-CM | POA: Diagnosis not present

## 2017-01-22 DIAGNOSIS — M6281 Muscle weakness (generalized): Secondary | ICD-10-CM

## 2017-01-22 DIAGNOSIS — R600 Localized edema: Secondary | ICD-10-CM

## 2017-01-22 DIAGNOSIS — R262 Difficulty in walking, not elsewhere classified: Secondary | ICD-10-CM

## 2017-01-22 NOTE — Therapy (Signed)
[place], [place], [address] Phone: [phone]   Fax:  [phone]  Physical Therapy Treatment (Re-Assessment)  Patient Details  Name: [name] MRN: [id number] Date of Birth: 1956/01/30 Referring Provider: Rod Can   Encounter Date: 01/22/2017      PT End of Session - 01/22/17 1733    Visit Number 9   Number of Visits 19   Date for PT Re-Evaluation 02/09/17   Authorization Type [employer] Managed (20 visit limit)   Authorization Time Period 01/02/17 to 02/13/17   Authorization - Visit Number 9   Authorization - Number of Visits 19   PT Start Time 8185   PT Stop Time 1728   PT Time Calculation (min) 42 min   Activity Tolerance Patient tolerated treatment well   Behavior During Therapy [facility] for tasks assessed/performed      Past Medical History:  Diagnosis Date  . Breast cancer [facility]) 2011   right breast with axillary lymph node removal  . Diabetes mellitus, type II ([place])    type 2  . GERD (gastroesophageal reflux disease)   . Hypertension   . Osteoarthritis of both knees   . Pneumonia    in early 1980's    Past Surgical History:  Procedure Laterality Date  . ABDOMINAL HYSTERECTOMY    . APPENDECTOMY  1978  . BLADDER SUSPENSION    . KNEE ARTHROPLASTY Left 06/29/2016   Procedure: LEFT TOTAL KNEE WITH NAVIGATION;  Surgeon: Rod Can, MD;  Location: [facility];  Service: Orthopedics;  Laterality: Left;  . KNEE ARTHROPLASTY Right 12/14/2016   Procedure: RIGHT TOTAL KNEE ARTHROPLASTY WITH COMPUTER NAVIGATION;  Surgeon: Rod Can, MD;  Location: [facility];  Service: Orthopedics;  Laterality: Right;  Needs RNFA  . MASTECTOMY, PARTIAL     right breast  . MENISCUS REPAIR     right knee  . OOPHORECTOMY      There were no vitals filed for this visit.      Subjective Assessment - 01/22/17 1649    Subjective Patient reports that things are going well; the hardest thing for her to right now is  sleep due to night pain with the knee. She is still not doing a whole lot of stuff, not a lot is too hard right now but standing is hard if its for a long time. No falls or close calls recently.    Pertinent History HTN, DM, chronic venous insufficiency, B TKR    How long can you sit comfortably? 4/23- 30 minutes    How long can you stand comfortably? 4/23- 30-45 minutes    How long can you walk comfortably? 4/23- 3 laps of [place] but needed breaks    Patient Stated Goals improve ROM and strength, get back to PLOF    Currently in Pain? Yes   Pain Score 4    Pain Location Knee   Pain Orientation Right   Pain Descriptors / Indicators Aching   Pain Type Surgical pain   Pain Radiating Towards none    Pain Onset More than a month ago   Pain Frequency Constant   Aggravating Factors  rainy weather   Pain Relieving Factors stretching/exercise    Effect of Pain on Daily Activities mild to moderate impact             [facility] PT Assessment - 01/22/17 0001      Observation/Other Assessments   Observations ongoing localized edema noted  AROM   Right Knee Extension 10   Right Knee Flexion 112     Strength   Right Hip Flexion 5/5   Right Hip Extension 4-/5   Right Hip ABduction 4/5   Left Hip Flexion 5/5   Left Hip Extension 4-/5   Left Hip ABduction 4+/5   Right Knee Flexion 4/5   Right Knee Extension 5/5   Left Knee Flexion 4+/5   Left Knee Extension 5/5   Right Ankle Dorsiflexion 5/5   Left Ankle Dorsiflexion 5/5     Ambulation/Gait   Gait Comments ongoing reduced gait speed, fatigue noted, R knee stiffness during gait cycle      6 minute walk test results    Aerobic Endurance Distance Walked 524   Endurance additional comments 3MWT no device      High Level Balance   High Level Balance Comments TUG 11.6 no device                      [facility] Adult PT Treatment/Exercise - 01/22/17 0001      Knee/Hip Exercises: Stretches   Active Hamstring Stretch  Right;3 reps;30 seconds   Active Hamstring Stretch Limitations 12 inch box    Knee: Self-Stretch to increase Flexion Right   Knee: Self-Stretch Limitations 15 reps x10 second holds    Gastroc Stretch 3 reps;30 seconds   Gastroc Stretch Limitations slantboard                 PT Education - 01/22/17 1733    Education provided Yes   Education Details progress wtih skilled PT services, POC moving forwards; possible benefits of compression stocking to assist in edema control, we will fax one copy to MD and give patient a copy to take to next appt    Person(s) Educated Patient   Methods Explanation;Handout   Comprehension Verbalized understanding;Need further instruction          PT Short Term Goals - 01/22/17 1709      PT SHORT TERM GOAL #1   Title Patient to demonstrate R knee ROM as being 0-115 degrees in order to reduce pain and improve mobility    Baseline 4/23- 10-112   Time 3   Period Weeks   Status On-going     PT SHORT TERM GOAL #2   Title Patient to be independent in scar massage, patella mobilizations, edema control in order to facilitate self-efficacy in managing condition    Baseline 4/23-independent in scar massage/edema control have not discussed patella mobility    Time 3   Period Weeks   Status Partially Met     PT SHORT TERM GOAL #3   Title Patient to be independent in correctly and consistently performing appropriate HEP, to be updated PRN    Baseline 4/23- "the ones that I do"; she is not doing all of them but the ones she can remember    Time 3   Period Weeks   Status Partially Met           PT Long Term Goals - 01/22/17 1711      PT LONG TERM GOAL #1   Title Patient to demonstrate functional strength 5/5 in order to improve gait and reduce pain    Baseline 4/23- improving    Time 6   Period Weeks   Status On-going     PT LONG TERM GOAL #2   Title Patient to be able to ambulate 770f during 3MWT in order to  improve community access and  overall mobiltiy    Baseline 4/23- 524ft    Time 6   Period Weeks   Status On-going     PT LONG TERM GOAL #3   Title Patient to be able to reciprocally ascend/descend full flight of staris with good eccentric control and minimal unsteadiness in order to enhance home and community access    Time 6   Period Weeks   Status Partially Met     PT LONG TERM GOAL #4   Title Patient to report she has been able to work in her garden and sleep without pain in order to improve general QOL    Baseline 4/23- still hurts through the night, has not tried the garden yet    Time 6   Period Weeks   Status On-going               Plan - 01/22/17 1734    Clinical Impression Statement Re-assessment performed today. Patient appears to be making progress with skilled PT services, showing some improvements in ROM, gait tolerance, and functional strength; she does, however, continue to show impairment in overall ROM, functional strength, and continues to show difficulty with edema control which could also possibly be reducing functional ROM.  Discussed possible compression stockings to assist in edema management and sent referral form to MD this session, also gave patient a copy to take to her next appointment. Recommend continuation of skilled PT services in order to continue to address functional deficits and assist in reaching optimal level of function.    Rehab Potential Good   Clinical Impairments Affecting Rehab Potential (+) high PLOF, past success with TKR/past PT    PT Frequency 3x / week   PT Duration 3 weeks   PT Treatment/Interventions ADLs/Self Care Home Management;Biofeedback;Cryotherapy;Moist Heat;DME Instruction;Gait training;Stair training;Functional mobility training;Therapeutic activities;Therapeutic exercise;Balance training;Neuromuscular re-education;Patient/family education;Manual techniques;Scar mobilization;Passive range of motion;Energy conservation   PT Next Visit Plan continue STM  to hamstrings, working on edema control and ROM. Continue balance exercises/activities. Follow up on compression stocking order status.    PT Home Exercise Plan Eval: quad sets, self knee extension overpressure, knee flexion stretch, walking program    Consulted and Agree with Plan of Care Patient      Patient will benefit from skilled therapeutic intervention in order to improve the following deficits and impairments:  Abnormal gait, Decreased skin integrity, Pain, Decreased coordination, Decreased mobility, Decreased range of motion, Decreased strength, Decreased activity tolerance, Decreased balance, Difficulty walking, Impaired flexibility  Visit Diagnosis: Stiffness of right knee, not elsewhere classified  Localized edema  Difficulty in walking, not elsewhere classified  Muscle weakness (generalized)     Problem List Patient Active Problem List   Diagnosis Date Noted  . Osteoarthritis of right knee 12/14/2016  . Primary osteoarthritis of left knee 06/29/2016  . Diabetes mellitus type 2, uncomplicated ([facility]) 06/14/2015  . GERD (gastroesophageal reflux disease) 04/16/2015  . Constipation 04/16/2015  . Chronic venous insufficiency 02/10/2015  . Osteoarthritis 02/10/2015  . Obesity 02/10/2015  . OSA (obstructive sleep apnea) 09/05/2012  . Essential hypertension, benign 12/31/2011    Kristen Unger PT, DPT [phone]  [facility] [address] [place], [place], [address] Phone: [phone]   Fax:  [phone]  Name: [name] MRN: [id number] Date of Birth: 03/19/1956   

## 2017-01-24 ENCOUNTER — Ambulatory Visit (HOSPITAL_COMMUNITY): Payer: Managed Care, Other (non HMO)

## 2017-01-24 DIAGNOSIS — R600 Localized edema: Secondary | ICD-10-CM

## 2017-01-24 DIAGNOSIS — R262 Difficulty in walking, not elsewhere classified: Secondary | ICD-10-CM

## 2017-01-24 DIAGNOSIS — M25661 Stiffness of right knee, not elsewhere classified: Secondary | ICD-10-CM

## 2017-01-24 DIAGNOSIS — M6281 Muscle weakness (generalized): Secondary | ICD-10-CM

## 2017-01-24 NOTE — Therapy (Signed)
[place], [place], [address] Phone: [phone]   Fax:  [phone]  Physical Therapy Treatment  Patient Details  Name: [name] MRN: [id number] Date of Birth: 12-06-1955 Referring Provider: Rod Can   Encounter Date: 01/24/2017      PT End of Session - 01/24/17 1652    Visit Number 10   Number of Visits 19   Date for PT Re-Evaluation 02/09/17   Authorization Type [employer] Managed (20 visit limit)   Authorization Time Period 01/02/17 to 02/13/17   Authorization - Visit Number 10   Authorization - Number of Visits 19   PT Start Time 2694   PT Stop Time 1731   PT Time Calculation (min) 41 min   Activity Tolerance Patient tolerated treatment well   Behavior During Therapy [facility] for tasks assessed/performed      Past Medical History:  Diagnosis Date  . Breast cancer [facility]) 2011   right breast with axillary lymph node removal  . Diabetes mellitus, type II ([place])    type 2  . GERD (gastroesophageal reflux disease)   . Hypertension   . Osteoarthritis of both knees   . Pneumonia    in early 1980's    Past Surgical History:  Procedure Laterality Date  . ABDOMINAL HYSTERECTOMY    . APPENDECTOMY  1978  . BLADDER SUSPENSION    . KNEE ARTHROPLASTY Left 06/29/2016   Procedure: LEFT TOTAL KNEE WITH NAVIGATION;  Surgeon: Rod Can, MD;  Location: [facility];  Service: Orthopedics;  Laterality: Left;  . KNEE ARTHROPLASTY Right 12/14/2016   Procedure: RIGHT TOTAL KNEE ARTHROPLASTY WITH COMPUTER NAVIGATION;  Surgeon: Rod Can, MD;  Location: [facility];  Service: Orthopedics;  Laterality: Right;  Needs RNFA  . MASTECTOMY, PARTIAL     right breast  . MENISCUS REPAIR     right knee  . OOPHORECTOMY      There were no vitals filed for this visit.      Subjective Assessment - 01/24/17 1650    Subjective Pt stated she did not sleep good today.  Reports knee is stiff today feels the weather may play at  part.  Current pain scale 3/10 today.   Patient Stated Goals improve ROM and strength, get back to PLOF    Currently in Pain? Yes   Pain Score 3    Pain Location Knee   Pain Orientation Right   Pain Descriptors / Indicators Aching;Tightness  Stiffness   Pain Type Surgical pain   Pain Radiating Towards none   Pain Onset More than a month ago   Pain Frequency Constant   Aggravating Factors  rainy weather   Pain Relieving Factors stretching/ exercise   Effect of Pain on Daily Activities mild to moderate impact                [facility] Adult PT Treatment/Exercise - 01/24/17 0001      Knee/Hip Exercises: Stretches   Active Hamstring Stretch Right;3 reps;30 seconds   Active Hamstring Stretch Limitations supine wiht rope; then standing 12in for lateral hamstring only   Knee: Self-Stretch to increase Flexion Right   Knee: Self-Stretch Limitations 15 reps x10 second holds      Knee/Hip Exercises: Standing   Heel Raises Both;1 set;20 reps   Heel Raises Limitations DF on incline x20 reps    Terminal Knee Extension Limitations GTB 15x   Functional Squat 10 reps   Functional Squat Limitations verbal cues to improve technique  Rocker Board 2 minutes   Rocker Board Limitations Lt/Rt   SLS with Vectors 3x 10"     Knee/Hip Exercises: Supine   Short Arc Quad Sets 15 reps   Heel Slides 15 reps     Manual Therapy   Manual Therapy Soft tissue mobilization   Manual therapy comments separate rest of session    Edema Management retrograde massage with LEs elevated    Joint Mobilization gentle patella mobilizations grade I-II    Soft tissue mobilization STM lateral, distal hamstring (Rt)                   PT Short Term Goals - 01/22/17 1709      PT SHORT TERM GOAL #1   Title Patient to demonstrate R knee ROM as being 0-115 degrees in order to reduce pain and improve mobility    Baseline 4/23- 10-112   Time 3   Period Weeks   Status On-going     PT SHORT TERM GOAL #2    Title Patient to be independent in scar massage, patella mobilizations, edema control in order to facilitate self-efficacy in managing condition    Baseline 4/23-independent in scar massage/edema control have not discussed patella mobility    Time 3   Period Weeks   Status Partially Met     PT SHORT TERM GOAL #3   Title Patient to be independent in correctly and consistently performing appropriate HEP, to be updated PRN    Baseline 4/23- "the ones that I do"; she is not doing all of them but the ones she can remember    Time 3   Period Weeks   Status Partially Met           PT Long Term Goals - 01/22/17 1711      PT LONG TERM GOAL #1   Title Patient to demonstrate functional strength 5/5 in order to improve gait and reduce pain    Baseline 4/23- improving    Time 6   Period Weeks   Status On-going     PT LONG TERM GOAL #2   Title Patient to be able to ambulate 734f during 3MWT in order to improve community access and overall mobiltiy    Baseline 4/23- 5220f   Time 6   Period Weeks   Status On-going     PT LONG TERM GOAL #3   Title Patient to be able to reciprocally ascend/descend full flight of staris with good eccentric control and minimal unsteadiness in order to enhance home and community access    Time 6   Period Weeks   Status Partially Met     PT LONG TERM GOAL #4   Title Patient to report she has been able to work in her garden and sleep without pain in order to improve general QOL    Baseline 4/23- still hurts through the night, has not tried the garden yet    Time 6   Period Weeks   Status On-going               Plan - 01/24/17 1722    Clinical Impression Statement Session focus on improving knee mobilty with manual and therex.  Pt continues to have significant edema present proximal knee and tight hamstrings noted more lateral than medial.  Manual technqiues complete for edema control and STM to hamstrings during prone knee hang.  Reviewed benefits  with compression hose for edema control, encouraged pt to call and schedule for  fitting.  Improved AROM 4-112 degrees following manual.  Progressed to standing therex with standing TKE and strengthening exercises to improve gait mechanics.  Educated pt with lateral hamstring stretches with abilty to demonstrate and verbalize proper form, plans to add to HEP.  No reports of increased pain through session.     Rehab Potential Good   Clinical Impairments Affecting Rehab Potential (+) high PLOF, past success with TKR/past PT    PT Frequency 3x / week   PT Duration 3 weeks   PT Treatment/Interventions ADLs/Self Care Home Management;Biofeedback;Cryotherapy;Moist Heat;DME Instruction;Gait training;Stair training;Functional mobility training;Therapeutic activities;Therapeutic exercise;Balance training;Neuromuscular re-education;Patient/family education;Manual techniques;Scar mobilization;Passive range of motion;Energy conservation   PT Next Visit Plan continue STM to hamstrings, working on edema control and ROM. Continue balance exercises/activities. Follow up on compression stocking order status.    PT Home Exercise Plan Eval: quad sets, self knee extension overpressure, knee flexion stretch, walking program; 4/25: standing lateral hamstring st       Patient will benefit from skilled therapeutic intervention in order to improve the following deficits and impairments:  Abnormal gait, Decreased skin integrity, Pain, Decreased coordination, Decreased mobility, Decreased range of motion, Decreased strength, Decreased activity tolerance, Decreased balance, Difficulty walking, Impaired flexibility  Visit Diagnosis: Stiffness of right knee, not elsewhere classified  Localized edema  Difficulty in walking, not elsewhere classified  Muscle weakness (generalized)     Problem List Patient Active Problem List   Diagnosis Date Noted  . Osteoarthritis of right knee 12/14/2016  . Primary osteoarthritis of  left knee 06/29/2016  . Diabetes mellitus type 2, uncomplicated ([place]) 95/18/8416  . GERD (gastroesophageal reflux disease) 04/16/2015  . Constipation 04/16/2015  . Chronic venous insufficiency 02/10/2015  . Osteoarthritis 02/10/2015  . Obesity 02/10/2015  . OSA (obstructive sleep apnea) 09/05/2012  . Essential hypertension, benign 12/31/2011   Ihor Austin, [place]; [place]  Aldona Lento 01/24/2017, 6:35 PM  [place], [place], [address] Phone: [phone]   Fax:  [phone]  Name: [name] MRN: [id number] Date of Birth: 06-Mar-1956

## 2017-01-26 ENCOUNTER — Ambulatory Visit (HOSPITAL_COMMUNITY): Payer: Managed Care, Other (non HMO)

## 2017-01-26 DIAGNOSIS — M25661 Stiffness of right knee, not elsewhere classified: Secondary | ICD-10-CM

## 2017-01-26 DIAGNOSIS — R262 Difficulty in walking, not elsewhere classified: Secondary | ICD-10-CM

## 2017-01-26 DIAGNOSIS — R600 Localized edema: Secondary | ICD-10-CM

## 2017-01-26 DIAGNOSIS — M6281 Muscle weakness (generalized): Secondary | ICD-10-CM

## 2017-01-26 NOTE — Therapy (Signed)
[place], [place], [address] Phone: [phone]   Fax:  [phone]  Physical Therapy Treatment  Patient Details  Name: [name] MRN: [id number] Date of Birth: Jul 24, 1956 Referring Provider: Rod Can   Encounter Date: 01/26/2017      PT End of Session - 01/26/17 1430    Visit Number 11   Number of Visits 19   Date for PT Re-Evaluation 02/09/17   Authorization Type [employer] Managed (20 visit limit)   Authorization Time Period 01/02/17 to 02/13/17   Authorization - Visit Number 11   Authorization - Number of Visits 19   PT Start Time 1350   PT Stop Time 1429   PT Time Calculation (min) 39 min   Activity Tolerance Patient tolerated treatment well;No increased pain   Behavior During Therapy WFL for tasks assessed/performed      Past Medical History:  Diagnosis Date  . Breast cancer [facility]) 2011   right breast with axillary lymph node removal  . Diabetes mellitus, type II ([place])    type 2  . GERD (gastroesophageal reflux disease)   . Hypertension   . Osteoarthritis of both knees   . Pneumonia    in early 1980's    Past Surgical History:  Procedure Laterality Date  . ABDOMINAL HYSTERECTOMY    . APPENDECTOMY  1978  . BLADDER SUSPENSION    . KNEE ARTHROPLASTY Left 06/29/2016   Procedure: LEFT TOTAL KNEE WITH NAVIGATION;  Surgeon: Rod Can, MD;  [facility];  Service: Orthopedics;  Laterality: Left;  . KNEE ARTHROPLASTY Right 12/14/2016   Procedure: RIGHT TOTAL KNEE ARTHROPLASTY WITH COMPUTER NAVIGATION;  Surgeon: Rod Can, MD;  Location: [facility];  Service: Orthopedics;  Laterality: Right;  Needs RNFA  . MASTECTOMY, PARTIAL     right breast  . MENISCUS REPAIR     right knee  . OOPHORECTOMY      There were no vitals filed for this visit.      Subjective Assessment - 01/26/17 1351    Subjective Pt stated current pain 3/10 "cramping" feeling on lateral aspect of Rt quad.  Reports  she had a better night rest though did toss and turn last night. Edema present Bil knees today   Pertinent History HTN, DM, chronic venous insufficiency, B TKR    Patient Stated Goals improve ROM and strength, get back to PLOF    Currently in Pain? Yes   Pain Score 3             [facility] Adult PT Treatment/Exercise - 01/26/17 0001      Knee/Hip Exercises: Stretches   Active Hamstring Stretch Right;3 reps;30 seconds   Active Hamstring Stretch Limitations standing 12in for lateral hamstring   Gastroc Stretch 3 reps;30 seconds   Gastroc Stretch Limitations slantboard      Knee/Hip Exercises: Standing   Heel Raises Both;1 set;20 reps   Heel Raises Limitations DF on incline x20 reps    Terminal Knee Extension Limitations GTB 15x   Functional Squat 10 reps   Functional Squat Limitations verbal cues to improve technique    Rocker Board 2 minutes   Rocker Board Limitations Lt/Rt   Other Standing Knee Exercises Rt TKE against wall for HEP     Knee/Hip Exercises: Supine   Knee Extension Limitations 4   Knee Flexion Limitations 112     Knee/Hip Exercises: Prone   Prone Knee Hang 4 minutes   Prone Knee Hang Limitations manual  to lateral hamstrings     Manual Therapy   Manual Therapy Soft tissue mobilization   Manual therapy comments separate rest of session    Edema Management retrograde massage with LEs elevated    Soft tissue mobilization STM lateral, distal hamstring (Rt) in prone and lateral distal quad in supine; utilized roller stick                  PT Short Term Goals - 01/22/17 1709      PT SHORT TERM GOAL #1   Title Patient to demonstrate R knee ROM as being 0-115 degrees in order to reduce pain and improve mobility    Baseline 4/23- 10-112   Time 3   Period Weeks   Status On-going     PT SHORT TERM GOAL #2   Title Patient to be independent in scar massage, patella mobilizations, edema control in order to facilitate self-efficacy in managing condition     Baseline 4/23-independent in scar massage/edema control have not discussed patella mobility    Time 3   Period Weeks   Status Partially Met     PT SHORT TERM GOAL #3   Title Patient to be independent in correctly and consistently performing appropriate HEP, to be updated PRN    Baseline 4/23- "the ones that I do"; she is not doing all of them but the ones she can remember    Time 3   Period Weeks   Status Partially Met           PT Long Term Goals - 01/22/17 1711      PT LONG TERM GOAL #1   Title Patient to demonstrate functional strength 5/5 in order to improve gait and reduce pain    Baseline 4/23- improving    Time 6   Period Weeks   Status On-going     PT LONG TERM GOAL #2   Title Patient to be able to ambulate 710f during 3MWT in order to improve community access and overall mobiltiy    Baseline 4/23- 5255f   Time 6   Period Weeks   Status On-going     PT LONG TERM GOAL #3   Title Patient to be able to reciprocally ascend/descend full flight of staris with good eccentric control and minimal unsteadiness in order to enhance home and community access    Time 6   Period Weeks   Status Partially Met     PT LONG TERM GOAL #4   Title Patient to report she has been able to work in her garden and sleep without pain in order to improve general QOL    Baseline 4/23- still hurts through the night, has not tried the garden yet    Time 6   Period Weeks   Status On-going               Plan - 01/26/17 1430    Clinical Impression Statement Began session with manual to address hamstring tightness, lateral quadricep spasm and edema control.  Continued discussion with compression hose benefits for edema control, resent order to MD for compression hose.  AROM 4-112 degrees following manual.  Progressed CKC for functional strengthening and balance activities.  Noted vast improvements with gait mechanics at EOS.  No reports of increased pain through session.     Rehab  Potential Good   Clinical Impairments Affecting Rehab Potential (+) high PLOF, past success with TKR/past PT    PT Frequency 3x / week  PT Duration 3 weeks   PT Treatment/Interventions ADLs/Self Care Home Management;Biofeedback;Cryotherapy;Moist Heat;DME Instruction;Gait training;Stair training;Functional mobility training;Therapeutic activities;Therapeutic exercise;Balance training;Neuromuscular re-education;Patient/family education;Manual techniques;Scar mobilization;Passive range of motion;Energy conservation   PT Next Visit Plan continue STM to hamstrings, working on edema control and ROM. Continue balance exercises/activities. Follow up on compression stocking order status.    PT Home Exercise Plan Eval: quad sets, self knee extension overpressure, knee flexion stretch, walking program; 4/25: standing lateral hamstring st 4/27: standing TKE      Patient will benefit from skilled therapeutic intervention in order to improve the following deficits and impairments:  Abnormal gait, Decreased skin integrity, Pain, Decreased coordination, Decreased mobility, Decreased range of motion, Decreased strength, Decreased activity tolerance, Decreased balance, Difficulty walking, Impaired flexibility  Visit Diagnosis: Stiffness of right knee, not elsewhere classified  Localized edema  Difficulty in walking, not elsewhere classified  Muscle weakness (generalized)     Problem List Patient Active Problem List   Diagnosis Date Noted  . Osteoarthritis of right knee 12/14/2016  . Primary osteoarthritis of left knee 06/29/2016  . Diabetes mellitus type 2, uncomplicated ([place]) 42/70/6237  . GERD (gastroesophageal reflux disease) 04/16/2015  . Constipation 04/16/2015  . Chronic venous insufficiency 02/10/2015  . Osteoarthritis 02/10/2015  . Obesity 02/10/2015  . OSA (obstructive sleep apnea) 09/05/2012  . Essential hypertension, benign 12/31/2011   Ihor Austin, [place];  [place]  Aldona Lento 01/26/2017, 3:39 PM  [place] [address] [place], [place], [address] Phone: [phone]   Fax:  [phone]  Name: [name] MRN: [id number] Date of Birth: [birth year]

## 2017-01-26 NOTE — Patient Instructions (Signed)
Knee Extension: Terminal - Standing (Single Leg)    Standing infront of wall, straighten knee pushing the back into wall.  Hold for 5 seconds and repeat 10 times twice a day.   http://tub.exer.us/36   Copyright  VHI. All rights reserved.

## 2017-01-29 ENCOUNTER — Ambulatory Visit (HOSPITAL_COMMUNITY): Payer: Managed Care, Other (non HMO) | Admitting: Physical Therapy

## 2017-01-29 ENCOUNTER — Telehealth (HOSPITAL_COMMUNITY): Payer: Self-pay | Admitting: Internal Medicine

## 2017-01-29 NOTE — Telephone Encounter (Signed)
01/29/17 pt called to cx she said something was going on with her belly

## 2017-01-31 ENCOUNTER — Ambulatory Visit (HOSPITAL_COMMUNITY): Payer: Managed Care, Other (non HMO) | Attending: Orthopedic Surgery

## 2017-01-31 DIAGNOSIS — M25661 Stiffness of right knee, not elsewhere classified: Secondary | ICD-10-CM | POA: Insufficient documentation

## 2017-01-31 DIAGNOSIS — M6281 Muscle weakness (generalized): Secondary | ICD-10-CM | POA: Diagnosis present

## 2017-01-31 DIAGNOSIS — R262 Difficulty in walking, not elsewhere classified: Secondary | ICD-10-CM

## 2017-01-31 DIAGNOSIS — R600 Localized edema: Secondary | ICD-10-CM | POA: Diagnosis present

## 2017-01-31 NOTE — Therapy (Signed)
[name], [place], [address] Phone: [phone]   Fax:  [phone]  Physical Therapy Treatment  Patient Details  Name: [name] MRN: [id number] Date of Birth: 24-Mar-1956 Referring Provider: Rod Can   Encounter Date: 01/31/2017      PT End of Session - 01/31/17 1523    Visit Number 12   Number of Visits 19   Date for PT Re-Evaluation 02/09/17   Authorization Type [employer] Managed (20 visit limit)   Authorization Time Period 01/02/17 to 02/13/17   Authorization - Visit Number 12   Authorization - Number of Visits 19   PT Start Time 8563   PT Stop Time 1600   PT Time Calculation (min) 43 min   Activity Tolerance Patient tolerated treatment well;No increased pain   Behavior During Therapy WFL for tasks assessed/performed      Past Medical History:  Diagnosis Date  . Breast cancer [facility]) 2011   right breast with axillary lymph node removal  . Diabetes mellitus, type II ([place])    type 2  . GERD (gastroesophageal reflux disease)   . Hypertension   . Osteoarthritis of both knees   . Pneumonia    in early 1980's    Past Surgical History:  Procedure Laterality Date  . ABDOMINAL HYSTERECTOMY    . APPENDECTOMY  1978  . BLADDER SUSPENSION    . KNEE ARTHROPLASTY Left 06/29/2016   Procedure: LEFT TOTAL KNEE WITH NAVIGATION;  Surgeon: Rod Can, MD;  Location: [facility];  Service: Orthopedics;  Laterality: Left;  . KNEE ARTHROPLASTY Right 12/14/2016   Procedure: RIGHT TOTAL KNEE ARTHROPLASTY WITH COMPUTER NAVIGATION;  Surgeon: Rod Can, MD;  Location: [facility];  Service: Orthopedics;  Laterality: Right;  Needs RNFA  . MASTECTOMY, PARTIAL     right breast  . MENISCUS REPAIR     right knee  . OOPHORECTOMY      There were no vitals filed for this visit.      Subjective Assessment - 01/31/17 1518    Subjective Pt stated knee pain scale 1/10 anterior knee, soreness on patella.  Went to MD  yesterday and reports happy wiht progress.  Plans to RTW 02/12/17.     Pertinent History HTN, DM, chronic venous insufficiency, B TKR    Patient Stated Goals improve ROM and strength, get back to PLOF    Currently in Pain? Yes   Pain Score 1    Pain Location Knee   Pain Orientation Right   Pain Descriptors / Indicators Sore   Pain Type Surgical pain   Pain Radiating Towards none   Pain Onset More than a month ago   Pain Frequency Constant   Aggravating Factors  rainy weather   Pain Relieving Factors stretching/exercise   Effect of Pain on Daily Activities mild to moderate impact                         [facility] Adult PT Treatment/Exercise - 01/31/17 0001      Knee/Hip Exercises: Stretches   Active Hamstring Stretch Right;3 reps;30 seconds   Active Hamstring Stretch Limitations standing 12in for lateral hamstring   Gastroc Stretch 3 reps;30 seconds   Gastroc Stretch Limitations slantboard      Knee/Hip Exercises: Standing   Heel Raises Both;1 set;20 reps   Heel Raises Limitations DF on incline x20 reps    Terminal Knee Extension Limitations GTB 15x   Lateral Step Up  Right;15 reps;Hand Hold: 1;Step Height: 4"   Functional Squat 15 reps   Stairs Reciprocal pattern 3RT 7in height with 1 HHA   Rocker Board 2 minutes   Rocker Board Limitations Lt/Rt   SLS Rt 30", Lt 27"   SLS with Vectors 3x 10"   Other Standing Knee Exercises tandem stance 3x 30" on foam     Knee/Hip Exercises: Supine   Quad Sets Right;1 set;15 reps   Quad Sets Limitations 5" holds   Knee Extension Limitations 3   Knee Flexion Limitations 111     Manual Therapy   Manual Therapy Edema management   Manual therapy comments separate rest of session    Edema Management retrograde massage with LEs elevated    Joint Mobilization gentle patella mobilizations grade I-II                   PT Short Term Goals - 01/22/17 1709      PT SHORT TERM GOAL #1   Title Patient to demonstrate R knee  ROM as being 0-115 degrees in order to reduce pain and improve mobility    Baseline 4/23- 10-112   Time 3   Period Weeks   Status On-going     PT SHORT TERM GOAL #2   Title Patient to be independent in scar massage, patella mobilizations, edema control in order to facilitate self-efficacy in managing condition    Baseline 4/23-independent in scar massage/edema control have not discussed patella mobility    Time 3   Period Weeks   Status Partially Met     PT SHORT TERM GOAL #3   Title Patient to be independent in correctly and consistently performing appropriate HEP, to be updated PRN    Baseline 4/23- "the ones that I do"; she is not doing all of them but the ones she can remember    Time 3   Period Weeks   Status Partially Met           PT Long Term Goals - 01/22/17 1711      PT LONG TERM GOAL #1   Title Patient to demonstrate functional strength 5/5 in order to improve gait and reduce pain    Baseline 4/23- improving    Time 6   Period Weeks   Status On-going     PT LONG TERM GOAL #2   Title Patient to be able to ambulate 738f during 3MWT in order to improve community access and overall mobiltiy    Baseline 4/23- 5264f   Time 6   Period Weeks   Status On-going     PT LONG TERM GOAL #3   Title Patient to be able to reciprocally ascend/descend full flight of staris with good eccentric control and minimal unsteadiness in order to enhance home and community access    Time 6   Period Weeks   Status Partially Met     PT LONG TERM GOAL #4   Title Patient to report she has been able to work in her garden and sleep without pain in order to improve general QOL    Baseline 4/23- still hurts through the night, has not tried the garden yet    Time 6   Period Weeks   Status On-going               Plan - 01/31/17 1524    Clinical Impression Statement Received signed referral for compression hose, educated pt with proper wear time and benefits for edema  control.   Pt reports plans for RTW on 02/12/2017, increased focus with functional strengthening this session with addition of stair training.  EOS with manual technqiues to address edema control for pain and knee mobility assistance.  AROM 3-111 degrees at EOS.   Rehab Potential Good   Clinical Impairments Affecting Rehab Potential (+) high PLOF, past success with TKR/past PT    PT Frequency 3x / week   PT Duration 3 weeks   PT Treatment/Interventions ADLs/Self Care Home Management;Biofeedback;Cryotherapy;Moist Heat;DME Instruction;Gait training;Stair training;Functional mobility training;Therapeutic activities;Therapeutic exercise;Balance training;Neuromuscular re-education;Patient/family education;Manual techniques;Scar mobilization;Passive range of motion;Energy conservation   PT Next Visit Plan F/U on purchase and wearing compression stockinig, answer questions PRN.  Progress functional strengtheing and balance.  Manual for edema control and ROM.     PT Home Exercise Plan Eval: quad sets, self knee extension overpressure, knee flexion stretch, walking program; 4/25: standing lateral hamstring st 4/27: standing TKE      Patient will benefit from skilled therapeutic intervention in order to improve the following deficits and impairments:  Abnormal gait, Decreased skin integrity, Pain, Decreased coordination, Decreased mobility, Decreased range of motion, Decreased strength, Decreased activity tolerance, Decreased balance, Difficulty walking, Impaired flexibility  Visit Diagnosis: Stiffness of right knee, not elsewhere classified  Localized edema  Difficulty in walking, not elsewhere classified  Muscle weakness (generalized)     Problem List Patient Active Problem List   Diagnosis Date Noted  . Osteoarthritis of right knee 12/14/2016  . Primary osteoarthritis of left knee 06/29/2016  . Diabetes mellitus type 2, uncomplicated ([place]) 25/27/1292  . GERD (gastroesophageal reflux disease) 04/16/2015   . Constipation 04/16/2015  . Chronic venous insufficiency 02/10/2015  . Osteoarthritis 02/10/2015  . Obesity 02/10/2015  . OSA (obstructive sleep apnea) 09/05/2012  . Essential hypertension, benign 12/31/2011   Ihor Austin, [place]; Rosine  Aldona Lento 01/31/2017, 4:24 PM  [facility] [address] [place], [place], [address] Phone: [phone]   Fax:  [phone]  Name: LYNETT BRASIL MRN: [id number] Date of Birth: [birth year]

## 2017-02-02 ENCOUNTER — Encounter (HOSPITAL_COMMUNITY): Payer: Managed Care, Other (non HMO) | Admitting: Physical Therapy

## 2017-02-05 ENCOUNTER — Ambulatory Visit (HOSPITAL_COMMUNITY): Payer: Managed Care, Other (non HMO) | Admitting: Physical Therapy

## 2017-02-05 ENCOUNTER — Telehealth (HOSPITAL_COMMUNITY): Payer: Self-pay | Admitting: Physical Therapy

## 2017-02-05 DIAGNOSIS — R262 Difficulty in walking, not elsewhere classified: Secondary | ICD-10-CM

## 2017-02-05 DIAGNOSIS — M25661 Stiffness of right knee, not elsewhere classified: Secondary | ICD-10-CM

## 2017-02-05 DIAGNOSIS — M6281 Muscle weakness (generalized): Secondary | ICD-10-CM

## 2017-02-05 DIAGNOSIS — R600 Localized edema: Secondary | ICD-10-CM

## 2017-02-05 NOTE — Therapy (Signed)
[name], [place], [address] Phone: [phone]   Fax:  [phone]  Physical Therapy Treatment  Patient Details  Name: [name] MRN: [id number] Date of Birth: 1955/11/26 Referring Provider: Rod Burns   Encounter Date: 02/05/2017      PT End of Session - 02/05/17 1705    Visit Number 13   Number of Visits 19   Date for PT Re-Evaluation 02/09/17   Authorization Type [employer] Managed (20 visit limit)   Authorization Time Period 01/02/17 to 02/13/17   Authorization - Visit Number 13   Authorization - Number of Visits 19   PT Start Time 6270   PT Stop Time 1730   PT Time Calculation (min) 40 min   Activity Tolerance Patient tolerated treatment well;No increased pain   Behavior During Therapy WFL for tasks assessed/performed      Past Medical History:  Diagnosis Date  . Breast cancer [facility]) 2011   right breast with axillary lymph node removal  . Diabetes mellitus, type II ([place])    type 2  . GERD (gastroesophageal reflux disease)   . Hypertension   . Osteoarthritis of both knees   . Pneumonia    in early 1980's    Past Surgical History:  Procedure Laterality Date  . ABDOMINAL HYSTERECTOMY    . APPENDECTOMY  1978  . BLADDER SUSPENSION    . KNEE ARTHROPLASTY Left 06/29/2016   Procedure: LEFT TOTAL KNEE WITH NAVIGATION;  Surgeon: Alexa Can, MD;  Location: [facility];  Service: Orthopedics;  Laterality: Left;  . KNEE ARTHROPLASTY Right 12/14/2016   Procedure: RIGHT TOTAL KNEE ARTHROPLASTY WITH COMPUTER NAVIGATION;  Surgeon: Alexa Can, MD;  Location: [facility];  Service: Orthopedics;  Laterality: Right;  Needs RNFA  . MASTECTOMY, PARTIAL     right breast  . MENISCUS REPAIR     right knee  . OOPHORECTOMY      There were no vitals filed for this visit.      Subjective Assessment - 02/05/17 1657    Subjective PT states she is doing well.  Is planning on going back to work on Monday and is  planning on finishing her remaining 2 visits.  States she is having a "cramp" pain in her lateral Rt LE   Currently in Pain? No/denies                         [facility] Adult PT Treatment/Exercise - 02/05/17 0001      Knee/Hip Exercises: Stretches   Active Hamstring Stretch Right;3 reps;30 seconds   Active Hamstring Stretch Limitations standing 12in for lateral hamstring   Gastroc Stretch 3 reps;30 seconds   Gastroc Stretch Limitations slantboard      Knee/Hip Exercises: Standing   Heel Raises Both;1 set;20 reps   Heel Raises Limitations DF on incline x20 reps    Functional Squat 15 reps   Functional Squat Limitations in front of chair for cues   Stairs Reciprocal pattern 3RT 7in height with 1 HHA   Rocker Board 2 minutes   Rocker Board Limitations Lt/Rt   SLS with Vectors 3x 10"     Knee/Hip Exercises: Supine   Knee Flexion Limitations 111     Manual Therapy   Manual Therapy Edema management   Manual therapy comments separate rest of session    Edema Management retrograde massage with LEs elevated    Joint Mobilization gentle patella mobilizations grade I-II  PT Short Term Goals - 01/22/17 1709      PT SHORT TERM GOAL #1   Title Patient to demonstrate R knee ROM as being 0-115 degrees in order to reduce pain and improve mobility    Baseline 4/23- 10-112   Time 3   Period Weeks   Status On-going     PT SHORT TERM GOAL #2   Title Patient to be independent in scar massage, patella mobilizations, edema control in order to facilitate self-efficacy in managing condition    Baseline 4/23-independent in scar massage/edema control have not discussed patella mobility    Time 3   Period Weeks   Status Partially Met     PT SHORT TERM GOAL #3   Title Patient to be independent in correctly and consistently performing appropriate HEP, to be updated PRN    Baseline 4/23- "the ones that I do"; she is not doing all of them but the ones she Burns  remember    Time 3   Period Weeks   Status Partially Met           PT Long Term Goals - 01/22/17 1711      PT LONG TERM GOAL #1   Title Patient to demonstrate functional strength 5/5 in order to improve gait and reduce pain    Baseline 4/23- improving    Time 6   Period Weeks   Status On-going     PT LONG TERM GOAL #2   Title Patient to be able to ambulate 75f during 3MWT in order to improve community access and overall mobiltiy    Baseline 4/23- 5275f   Time 6   Period Weeks   Status On-going     PT LONG TERM GOAL #3   Title Patient to be able to reciprocally ascend/descend full flight of staris with good eccentric control and minimal unsteadiness in order to enhance home and community access    Time 6   Period Weeks   Status Partially Met     PT LONG TERM GOAL #4   Title Patient to report she has been able to work in her garden and sleep without pain in order to improve general QOL    Baseline 4/23- still hurts through the night, has not tried the garden yet    Time 6   Period Weeks   Status On-going               Plan - 02/05/17 1706    Clinical Impression Statement Pt to contact [facility] this week as [facility] [employer] they were going to be $80.  Pt is not having any difficulty with any activites or daily work tasks.  Added standing ITB stretch, however did not target tight LE, more posterior in hamstring area.    Pt with good form and no pain completing reciprocal stairs with 1 HR.   swelling seems to be decreasing with less tightness overalll.     Rehab Potential Good   Clinical Impairments Affecting Rehab Potential (+) high PLOF, past success with TKR/past PT    PT Frequency 3x / week   PT Duration 3 weeks   PT Treatment/Interventions ADLs/Self Care Home Management;Biofeedback;Cryotherapy;Moist Heat;DME Instruction;Gait training;Stair training;Functional mobility training;Therapeutic activities;Therapeutic exercise;Balance  training;Neuromuscular re-education;Patient/family education;Manual techniques;Scar mobilization;Passive range of motion;Energy conservation   PT Next Visit Plan F/U on purchase and wearing compression stockings.  Continue X 2 more sessions then re-eval; RTW on Monday 02/12/17   PT  Home Exercise Plan Eval: quad sets, self knee extension overpressure, knee flexion stretch, walking program; 4/25: standing lateral hamstring st 4/27: standing TKE      Patient will benefit from skilled therapeutic intervention in order to improve the following deficits and impairments:  Abnormal gait, Decreased skin integrity, Pain, Decreased coordination, Decreased mobility, Decreased range of motion, Decreased strength, Decreased activity tolerance, Decreased balance, Difficulty walking, Impaired flexibility  Visit Diagnosis: Stiffness of right knee, not elsewhere classified  Localized edema  Difficulty in walking, not elsewhere classified  Muscle weakness (generalized)     Problem List Patient Active Problem List   Diagnosis Date Noted  . Osteoarthritis of right knee 12/14/2016  . Primary osteoarthritis of left knee 06/29/2016  . Diabetes mellitus type 2, uncomplicated ([place]) 16/38/4536  . GERD (gastroesophageal reflux disease) 04/16/2015  . Constipation 04/16/2015  . Chronic venous insufficiency 02/10/2015  . Osteoarthritis 02/10/2015  . Obesity 02/10/2015  . OSA (obstructive sleep apnea) 09/05/2012  . Essential hypertension, benign 12/31/2011    Teena Irani, PTA/CLT [phone]  02/05/2017, 5:31 PM  [place], [place], [address] Phone: [phone]   Fax:  [phone]  Name: [name] MRN: [id number] Date of Birth: 22-Jan-1956

## 2017-02-05 NOTE — Telephone Encounter (Signed)
[employer] Rep Suirj called requesting Medical Records - [employer] was given HIM number to call: [phone]. NF 02/05/17

## 2017-02-07 ENCOUNTER — Ambulatory Visit (HOSPITAL_COMMUNITY): Payer: Managed Care, Other (non HMO)

## 2017-02-07 NOTE — Telephone Encounter (Signed)
No show, called and spoke to pt who stated she was on her way, thought the apt was a later time today.  Reminded next apt date and time wiht contact info given.    [address] [place]; [facility] [phone]

## 2017-02-09 ENCOUNTER — Ambulatory Visit (HOSPITAL_COMMUNITY): Payer: Managed Care, Other (non HMO) | Admitting: Physical Therapy

## 2017-02-09 DIAGNOSIS — R262 Difficulty in walking, not elsewhere classified: Secondary | ICD-10-CM

## 2017-02-09 DIAGNOSIS — M25661 Stiffness of right knee, not elsewhere classified: Secondary | ICD-10-CM

## 2017-02-09 DIAGNOSIS — M6281 Muscle weakness (generalized): Secondary | ICD-10-CM

## 2017-02-09 DIAGNOSIS — R600 Localized edema: Secondary | ICD-10-CM

## 2017-02-12 NOTE — Therapy (Signed)
[place], [place], [address] Phone: [phone]   Fax:  [phone]  Physical Therapy Treatment/Discharge  Patient Details  Name: [name] MRN: [id number] Date of Birth: Apr 21, 1956 Referring Provider: Rod Can, MD  Encounter Date: 02/09/2017      PT End of Session - 02/12/17 0746    Visit Number 14   Number of Visits 19   Date for PT Re-Evaluation 02/09/17   Authorization Type [employer] Managed (20 visit limit)   Authorization Time Period 01/02/17 to 02/13/17   Authorization - Visit Number 14   Authorization - Number of Visits 19   PT Start Time 2536   PT Stop Time 6440   PT Time Calculation (min) 38 min   Activity Tolerance Patient tolerated treatment well;No increased pain   Behavior During Therapy WFL for tasks assessed/performed      Past Medical History:  Diagnosis Date  . Breast cancer [facility]) 2011   right breast with axillary lymph node removal  . Diabetes mellitus, type II ([place])    type 2  . GERD (gastroesophageal reflux disease)   . Hypertension   . Osteoarthritis of both knees   . Pneumonia    in early 1980's    Past Surgical History:  Procedure Laterality Date  . ABDOMINAL HYSTERECTOMY    . APPENDECTOMY  1978  . BLADDER SUSPENSION    . KNEE ARTHROPLASTY Left 06/29/2016   Procedure: LEFT TOTAL KNEE WITH NAVIGATION;  Surgeon: Rod Can, MD;  Location: [facility];  Service: Orthopedics;  Laterality: Left;  . KNEE ARTHROPLASTY Right 12/14/2016   Procedure: RIGHT TOTAL KNEE ARTHROPLASTY WITH COMPUTER NAVIGATION;  Surgeon: Rod Can, MD;  Location: [facility];  Service: Orthopedics;  Laterality: Right;  Needs RNFA  . MASTECTOMY, PARTIAL     right breast  . MENISCUS REPAIR     right knee  . OOPHORECTOMY      There were no vitals filed for this visit.          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; lots of sitting and travelling    Leisure reading, going to [employer], going outside, gardening      Observation/Other Assessments   Observations incision healed and intact      AROM   Right Knee Extension 5   Right Knee Flexion 108     Strength   Right Hip Flexion 5/5   Right Hip Extension 5/5   Right Hip ABduction 5/5   Left Hip Flexion 5/5   Left Hip Extension 5/5   Left Hip ABduction 5/5   Right Knee Flexion 5/5   Right Knee Extension 5/5   Left Knee Flexion 5/5   Left Knee Extension 5/5   Right Ankle Dorsiflexion 5/5   Left Ankle Dorsiflexion 5/5     Ambulation/Gait   Gait Comments no deviations or difficulty noted      6 minute walk test results    Aerobic Endurance  Distance Walked 750   Endurance additional comments 3MWT     High Level Balance   High Level Balance Comments TUG 10  past session                      [facility] Treatment/Exercise - 02/12/17 0001      Manual Therapy   Manual Therapy Soft tissue mobilization;Myofascial release   Manual therapy comments separate rest of session    Soft tissue mobilization IASTM along lateral quad and Medial hamstring   Myofascial Release Trigger point release vastus lateralis                 PT Education - 02/12/17 0744    Education provided Yes   Education Details progress and goals met; updated HEP to include manual techniques for home; discharge from PT; reviewed techniques for self massage/rolling at home.    Person(s) Educated Patient   Methods Explanation   Comprehension Verbalized understanding           PT Short Term Goals - 02/09/17 1549      PT SHORT TERM GOAL #1   Title Patient to demonstrate R knee ROM as being 0-115 degrees in order to reduce pain and improve mobility    Baseline 110 deg    Time 3   Period Weeks   Status Partially Met     PT SHORT TERM GOAL #2   Title Patient to be independent in scar massage, patella mobilizations, edema control in order to facilitate self-efficacy in managing condition    Baseline 4/23-independent in scar massage/edema control have not discussed patella mobility    Time 3   Period Weeks   Status Partially Met     PT SHORT TERM GOAL #3   Title Patient to be independent in correctly and consistently performing appropriate HEP, to be updated PRN    Baseline 4/23- "the ones that I do"; she is not doing all of them but the ones she can remember    Time 3   Period Weeks   Status Partially Met           PT Long Term Goals - 02/09/17 1550      PT LONG TERM GOAL #1   Title Patient to demonstrate functional strength 5/5 in order to improve gait and reduce pain    Baseline 4/23- improving    Time 6   Period Weeks   Status Achieved     PT LONG TERM GOAL #2   Title Patient to be able to ambulate 76f during 3MWT in order to improve community access and overall mobiltiy    Baseline 750 ft   Time 6   Period Weeks   Status Achieved     PT LONG TERM GOAL #3   Title Patient to be able to reciprocally ascend/descend full flight of staris with good eccentric control and minimal unsteadiness in order to enhance home and community access    Time 6   Period Weeks   Status Achieved     PT LONG TERM GOAL #4   Title Patient to report she has been able to work in her garden and sleep without pain in order to improve general QOL    Baseline has been working out in the yard and has been active    Time 6   Period Weeks   Status Achieved               Plan -  02/12/17 0748    Clinical Impression Statement Pt was discharged this  visit having met all of her goals. She demonstrates full and pain free LE strenght, improved knee AROM, and her balance/functional mobility is within what is expected for someone her age. She continues to perform her HEP without difficulty and is planning to return to work soon. At this time, her only limitations are noted with muscle tightness/trigger points throughout the quadriceps and hamstring. Therapist performed manual techniques to address this and provided HEP additions to further address this at home. Pt reporting resolution of her symptoms by the end of the session and demonstrated good understanding of technique.   Rehab Potential Good   Clinical Impairments Affecting Rehab Potential (+) high PLOF, past success with TKR/past PT    PT Frequency 3x / week   PT Duration 3 weeks   PT Treatment/Interventions ADLs/Self Care Home Management;Biofeedback;Cryotherapy;Moist Heat;DME Instruction;Gait training;Stair training;Functional mobility training;Therapeutic activities;Therapeutic exercise;Balance training;Neuromuscular re-education;Patient/family education;Manual techniques;Scar mobilization;Passive range of motion;Energy conservation   PT Next Visit Plan F/U on purchase and wearing compression stockings.  Continue X 2 more sessions then re-eval; RTW on Monday 02/12/17   PT Home Exercise Plan Eval: quad sets, self knee extension overpressure, knee flexion stretch, walking program; 4/25: standing lateral hamstring st 4/27: standing TKE   Consulted and Agree with Plan of Care Patient      Patient will benefit from skilled therapeutic intervention in order to improve the following deficits and impairments:     Visit Diagnosis: Stiffness of right knee, not elsewhere classified  Localized edema  Difficulty in walking, not elsewhere classified  Muscle weakness (generalized)  PHYSICAL THERAPY DISCHARGE SUMMARY  Visits from Start of Care: 14  Current functional level related to goals /  functional outcomes: See above for more details   Remaining deficits: See above for more details   Education / Equipment: See above for more details  Plan: Patient agrees to discharge.  Patient goals were met. Patient is being discharged due to meeting the stated rehab goals.  ?????        Problem List Patient Active Problem List   Diagnosis Date Noted  . Osteoarthritis of right knee 12/14/2016  . Primary osteoarthritis of left knee 06/29/2016  . Diabetes mellitus type 2, uncomplicated ([place]) 81/85/9093  . GERD (gastroesophageal reflux disease) 04/16/2015  . Constipation 04/16/2015  . Chronic venous insufficiency 02/10/2015  . Osteoarthritis 02/10/2015  . Obesity 02/10/2015  . OSA (obstructive sleep apnea) 09/05/2012  . Essential hypertension, benign 12/31/2011   7:54 AM,02/12/17 Elly Modena PT, DPT [facility] [address] [place], [place], [address] Phone: [phone]   Fax:  [phone]  Name: [name] MRN: [id number] Date of Birth: Jul 19, 1956

## 2017-03-13 ENCOUNTER — Other Ambulatory Visit: Payer: Self-pay | Admitting: Internal Medicine

## 2017-05-23 NOTE — Addendum Note (Signed)
Addendum  created 05/23/17 1222 by Albertha Ghee, MD   Sign clinical note

## 2017-07-24 ENCOUNTER — Ambulatory Visit (INDEPENDENT_AMBULATORY_CARE_PROVIDER_SITE_OTHER): Payer: Managed Care, Other (non HMO) | Admitting: Internal Medicine

## 2017-07-24 ENCOUNTER — Other Ambulatory Visit (INDEPENDENT_AMBULATORY_CARE_PROVIDER_SITE_OTHER): Payer: Managed Care, Other (non HMO)

## 2017-07-24 ENCOUNTER — Encounter: Payer: Self-pay | Admitting: Internal Medicine

## 2017-07-24 VITALS — BP 150/92 | HR 78 | Temp 98.5°F | Ht 66.0 in | Wt 201.0 lb

## 2017-07-24 DIAGNOSIS — E119 Type 2 diabetes mellitus without complications: Secondary | ICD-10-CM | POA: Diagnosis not present

## 2017-07-24 DIAGNOSIS — I5022 Chronic systolic (congestive) heart failure: Secondary | ICD-10-CM | POA: Diagnosis not present

## 2017-07-24 DIAGNOSIS — Z0000 Encounter for general adult medical examination without abnormal findings: Secondary | ICD-10-CM

## 2017-07-24 DIAGNOSIS — I10 Essential (primary) hypertension: Secondary | ICD-10-CM

## 2017-07-24 LAB — CBC
HEMATOCRIT: 42.5 % (ref 36.0–46.0)
Hemoglobin: 13.2 g/dL (ref 12.0–15.0)
MCHC: 31 g/dL (ref 30.0–36.0)
MCV: 89.1 fl (ref 78.0–100.0)
Platelets: 439 10*3/uL — ABNORMAL HIGH (ref 150.0–400.0)
RBC: 4.77 Mil/uL (ref 3.87–5.11)
RDW: 14.9 % (ref 11.5–15.5)
WBC: 5.2 10*3/uL (ref 4.0–10.5)

## 2017-07-24 LAB — COMPREHENSIVE METABOLIC PANEL
ALBUMIN: 4.4 g/dL (ref 3.5–5.2)
ALT: 10 U/L (ref 0–35)
AST: 14 U/L (ref 0–37)
Alkaline Phosphatase: 77 U/L (ref 39–117)
BUN: 9 mg/dL (ref 6–23)
CALCIUM: 10.2 mg/dL (ref 8.4–10.5)
CO2: 30 meq/L (ref 19–32)
Chloride: 102 mEq/L (ref 96–112)
Creatinine, Ser: 0.89 mg/dL (ref 0.40–1.20)
GFR: 82.77 mL/min (ref >60.00)
Glucose, Bld: 112 mg/dL — ABNORMAL HIGH (ref 70–99)
POTASSIUM: 4.4 meq/L (ref 3.5–5.1)
Sodium: 138 mEq/L (ref 135–145)
Total Bilirubin: 0.5 mg/dL (ref 0.2–1.2)
Total Protein: 7.9 g/dL (ref 6.0–8.3)

## 2017-07-24 LAB — LIPID PANEL
CHOL/HDL RATIO: 3
Cholesterol: 151 mg/dL (ref 0–200)
HDL: 53.8 mg/dL (ref >39.00)
LDL CALC: 79 mg/dL (ref 0–99)
NonHDL: 97.68
TRIGLYCERIDES: 94 mg/dL (ref 0.0–149.0)
VLDL: 18.8 mg/dL (ref 0.0–40.0)

## 2017-07-24 LAB — HEMOGLOBIN A1C: Hgb A1c MFr Bld: 6.2 % (ref 4.6–6.5)

## 2017-07-24 MED ORDER — DICLOFENAC SODIUM 75 MG PO TBEC: 75.0000 mg | DELAYED_RELEASE_TABLET | Freq: Two times a day (BID) | ORAL | 3 refills | Status: DC

## 2017-07-24 MED ORDER — SPIRONOLACTONE 25 MG PO TABS: 25.0000 mg | ORAL_TABLET | Freq: Every day | ORAL | 1 refills | Status: DC

## 2017-07-24 MED ORDER — AMLODIPINE BESYLATE 10 MG PO TABS: 10.0000 mg | ORAL_TABLET | Freq: Every day | ORAL | 1 refills | Status: DC

## 2017-07-24 MED ORDER — METHOCARBAMOL 500 MG PO TABS: 500.0000 mg | ORAL_TABLET | Freq: Four times a day (QID) | ORAL | 0 refills | Status: DC | PRN

## 2017-07-24 NOTE — Progress Notes (Signed)
   Subjective:    Patient ID: [name], female    DOB: [birth year], 61 y.o.   MRN: [id number]  HPI The patient is a 61 year old female coming in today for physical. Out of some medications.  PMH, [facility], social history reviewed and updated.  Review of Systems  Constitutional: Negative.   HENT: Negative.   Eyes: Negative.   Respiratory: Negative for cough, chest tightness and shortness of breath.   Cardiovascular: Negative for chest pain, palpitations and leg swelling.  Gastrointestinal: Negative for abdominal distention, abdominal pain, constipation, diarrhea, nausea and vomiting.  Musculoskeletal: Negative.   Skin: Negative.   Neurological: Positive for headaches.  Psychiatric/Behavioral: Negative.       Objective:   Physical Exam  Constitutional: She is oriented to person, place, and time. She appears well-developed and well-nourished.  HENT:  Head: Normocephalic and atraumatic.  Eyes: EOM are normal.  Neck: Normal range of motion.  Cardiovascular: Normal rate and regular rhythm.   Pulmonary/Chest: Effort normal and breath sounds normal. No respiratory distress. She has no wheezes. She has no rales.  Abdominal: Soft. Bowel sounds are normal. She exhibits no distension. There is no tenderness. There is no rebound.  Musculoskeletal: She exhibits no edema.  Neurological: She is alert and oriented to person, place, and time. Coordination normal.  Skin: Skin is warm and dry.  Psychiatric: She has a normal mood and affect.   Vitals:   07/24/17 0933  BP: (!) 150/92  Pulse: 78  Temp: 98.5 F (36.9 C)  TempSrc: Oral  SpO2: 98%  Weight: 201 lb (91.2 kg)  Height: 5\' 6"  (1.676 m)      Assessment & Plan:

## 2017-07-24 NOTE — Patient Instructions (Signed)
We need you to start taking the amlodipine and spironolactone again as well as the metformin.   We are checking the labs today and will call with any changes.   We have also sent in the refills for the knee pain.

## 2017-07-26 DIAGNOSIS — Z0000 Encounter for general adult medical examination without abnormal findings: Secondary | ICD-10-CM | POA: Insufficient documentation

## 2017-07-26 NOTE — Assessment & Plan Note (Signed)
Complicated by diabetes and hypertension and OSA. BMI 32 and working on losing 5 pounds in the next 6 months before follow up.

## 2017-07-26 NOTE — Assessment & Plan Note (Signed)
Flu shot done, declines tetanus and pneumonia shot. Pap smear not indicated. Colonoscopy up to date. Counseled about sun safety and mole surveillance. Given screening recommendations.

## 2017-07-26 NOTE — Assessment & Plan Note (Addendum)
Foot exam done, checking HgA1c and lipid panel. Taking metformin and adjust as needed. Not complicated.

## 2017-07-26 NOTE — Assessment & Plan Note (Signed)
BP above goal today due to being out of amlodipine for about 1 week. Restart amlodipine and continue spironolactone and lasix. Checking CMP and adjust as needed.

## 2017-07-28 IMAGING — DX DG KNEE 1-2V PORT*L*
2 series · 2 of 2 positions shown · non-contrast
Comparison: None.

CLINICAL DATA: Status post left total knee arthroplasty.

EXAM:
PORTABLE LEFT KNEE - 1-2 VIEW

[knee ap]
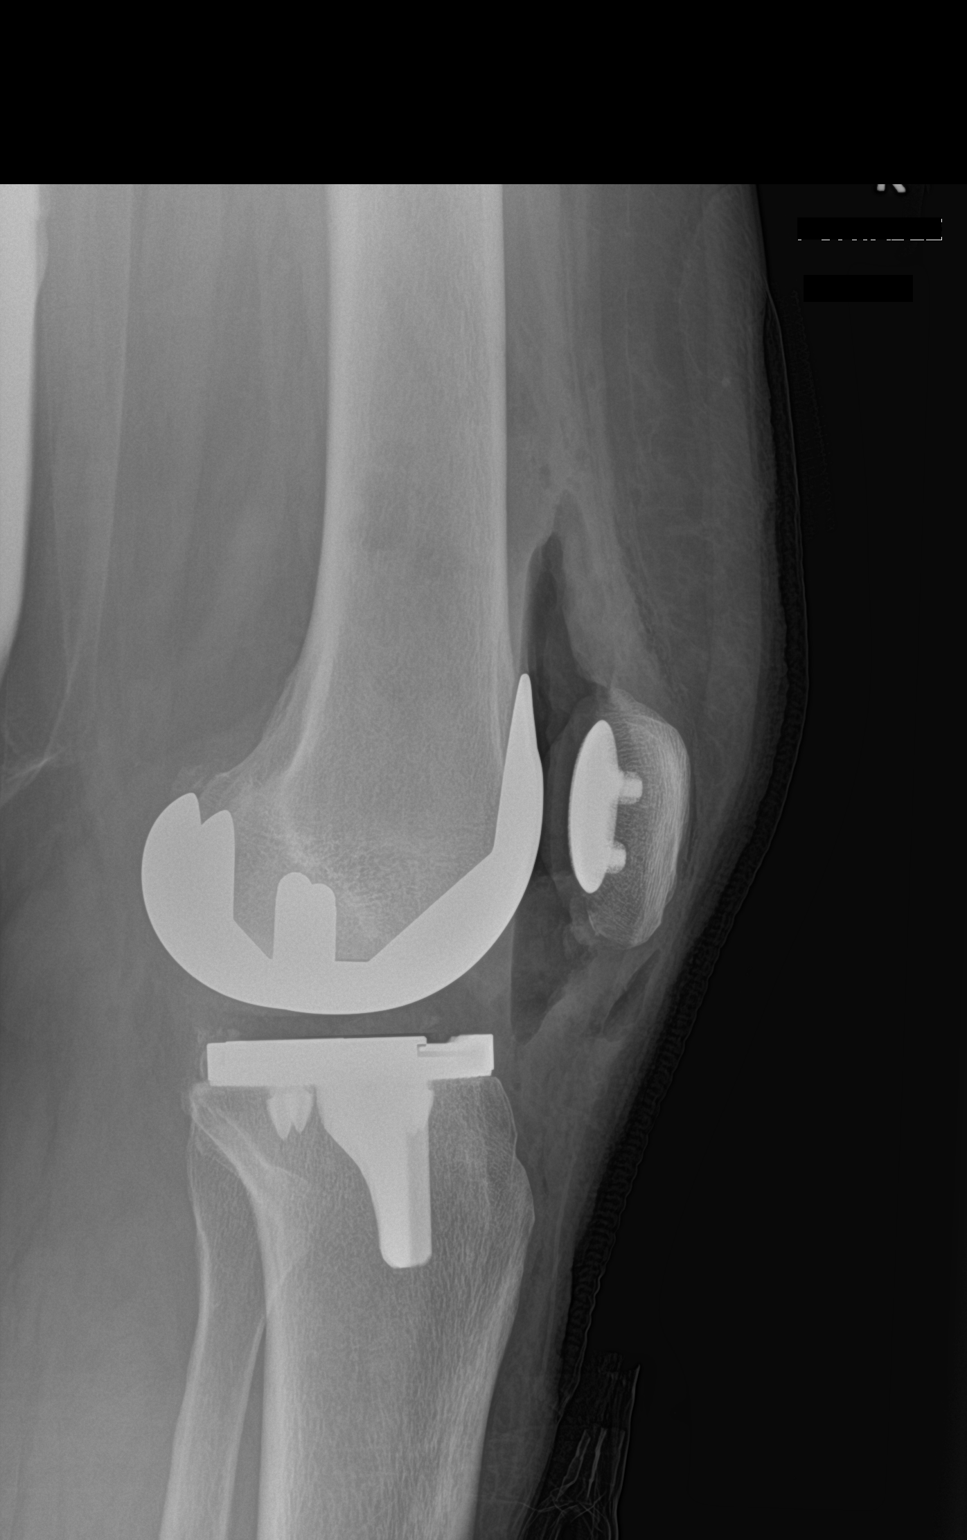

[knee lat]
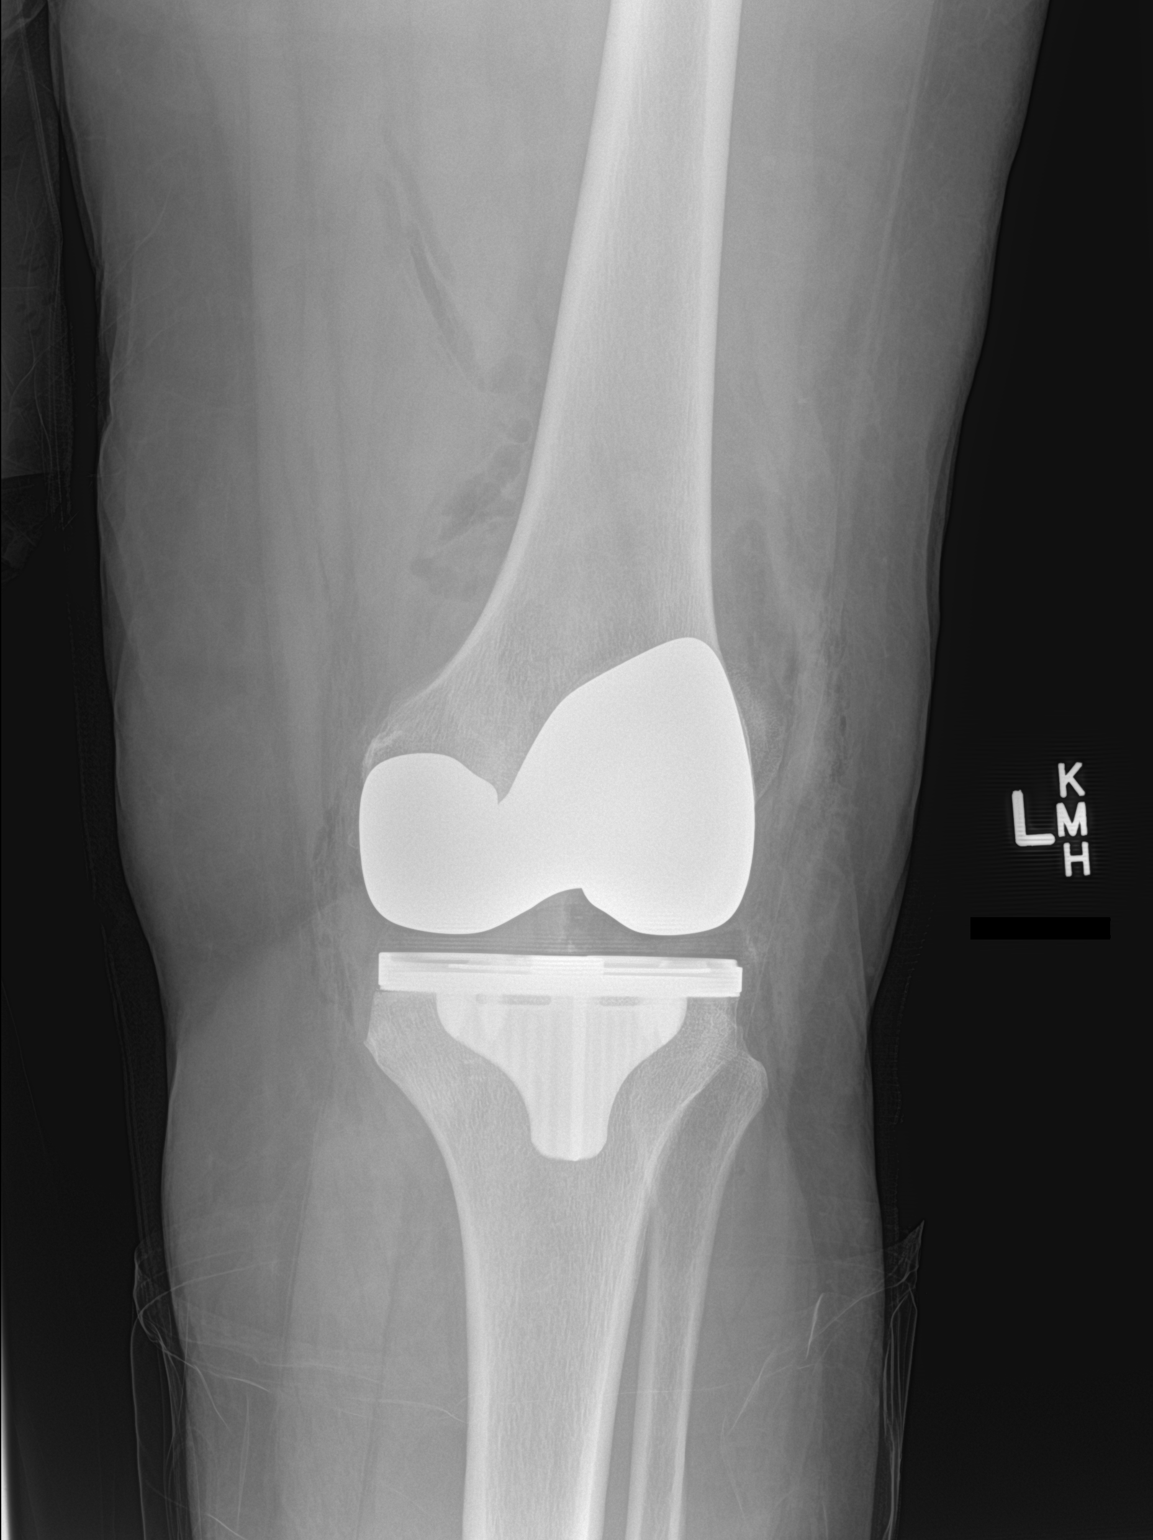

[2 of 2 positions shown; findings below may reference images not displayed]

FINDINGS: The femoral, tibial and patellar components are well seated. No
complicating features are demonstrated.
IMPRESSION: Well seated components of a total left knee arthroplasty.

## 2017-09-17 ENCOUNTER — Other Ambulatory Visit: Payer: Self-pay | Admitting: Internal Medicine

## 2017-09-27 ENCOUNTER — Ambulatory Visit: Payer: Managed Care, Other (non HMO) | Admitting: Internal Medicine

## 2017-09-28 ENCOUNTER — Encounter: Payer: Managed Care, Other (non HMO) | Admitting: Internal Medicine

## 2017-10-08 ENCOUNTER — Other Ambulatory Visit: Payer: Self-pay | Admitting: Internal Medicine

## 2017-10-08 DIAGNOSIS — Z853 Personal history of malignant neoplasm of breast: Secondary | ICD-10-CM

## 2017-10-17 ENCOUNTER — Ambulatory Visit
Admission: RE | Admit: 2017-10-17 | Discharge: 2017-10-17 | Disposition: A | Discharge status: Home / Self Care | Payer: Managed Care, Other (non HMO) | Source: Ambulatory Visit | Attending: Internal Medicine | Admitting: Internal Medicine

## 2017-10-17 DIAGNOSIS — Z853 Personal history of malignant neoplasm of breast: Secondary | ICD-10-CM

## 2017-10-17 HISTORY — DX: Personal history of antineoplastic chemotherapy: Z92.21

## 2017-10-17 HISTORY — DX: Personal history of irradiation: Z92.3

## 2017-11-05 ENCOUNTER — Encounter: Payer: Self-pay | Admitting: Internal Medicine

## 2017-11-09 ENCOUNTER — Other Ambulatory Visit: Payer: Self-pay | Admitting: Internal Medicine

## 2017-11-20 LAB — HM DIABETES EYE EXAM

## 2017-11-21 ENCOUNTER — Encounter: Payer: Self-pay | Admitting: Internal Medicine

## 2017-11-21 NOTE — Progress Notes (Signed)
Abstracted and sent to scan  

## 2017-11-27 ENCOUNTER — Other Ambulatory Visit: Payer: Self-pay

## 2017-11-27 MED ORDER — METFORMIN HCL 500 MG PO TABS: 500.0000 mg | ORAL_TABLET | Freq: Two times a day (BID) | ORAL | 1 refills | Status: DC

## 2017-12-08 ENCOUNTER — Other Ambulatory Visit: Payer: Self-pay | Admitting: Internal Medicine

## 2017-12-26 ENCOUNTER — Encounter: Payer: Self-pay | Admitting: Internal Medicine

## 2017-12-26 ENCOUNTER — Ambulatory Visit: Payer: Managed Care, Other (non HMO) | Admitting: Internal Medicine

## 2017-12-26 DIAGNOSIS — L0291 Cutaneous abscess, unspecified: Secondary | ICD-10-CM | POA: Diagnosis not present

## 2017-12-26 MED ORDER — METHOCARBAMOL 500 MG PO TABS: 500.0000 mg | ORAL_TABLET | Freq: Three times a day (TID) | ORAL | 0 refills | Status: DC | PRN

## 2017-12-26 MED ORDER — SULFAMETHOXAZOLE-TRIMETHOPRIM 800-160 MG PO TABS: 1.0000 | ORAL_TABLET | Freq: Two times a day (BID) | ORAL | 0 refills | Status: DC

## 2017-12-26 NOTE — Patient Instructions (Signed)
We have sent in bactrim for the infection. Take 1 pill twice a day for 10 days. If you are fully better then stop taking it. If you still need more medicine take the last 3 days.   If the areas do not go away call us back and we can possibly drain them.

## 2017-12-26 NOTE — Progress Notes (Signed)
   Subjective:    Patient ID: [name], female    DOB: 1956/08/29, 62 y.o.   MRN: [id number]  HPI The patient is a 62 YO female coming in for skin problem. She scraped her skin with the deodorant and now has several places which are open. She is draining out some stuff. They are sore but not that painful. She denies fevers or chills. Some redness around the areas and they are swollen. She denies history of these. Denies change to hygiene. Does not use antiperspirant.   Review of Systems  Constitutional: Negative.   Respiratory: Negative for cough, chest tightness and shortness of breath.   Cardiovascular: Negative for chest pain, palpitations and leg swelling.  Gastrointestinal: Negative for abdominal distention, abdominal pain, constipation, diarrhea, nausea and vomiting.  Musculoskeletal: Negative.   Skin: Positive for color change, rash and wound.  Neurological: Negative.   Psychiatric/Behavioral: Negative.       Objective:   Physical Exam  Constitutional: She is oriented to person, place, and time. She appears well-developed and well-nourished.  HENT:  Head: Normocephalic and atraumatic.  Eyes: EOM are normal.  Neck: Normal range of motion.  Cardiovascular: Normal rate and regular rhythm.  Pulmonary/Chest: Effort normal and breath sounds normal. No respiratory distress. She has no wheezes. She has no rales.  Abdominal: Soft.  Neurological: She is alert and oriented to person, place, and time. Coordination normal.  Skin: Skin is warm and dry.  Several abscesses, 2 under the right arm which are open. Hard lesion underneath and no purulent material expressed.  1 lesion on the left breast which is also open but no drainage and hard underneath   Vitals:   12/26/17 1115  BP: 110/80  Pulse: 65  Temp: 97.8 F (36.6 C)  TempSrc: Oral  SpO2: 99%  Weight: 207 lb (93.9 kg)  Height: 5\' 6"  (1.676 m)      Assessment & Plan:

## 2017-12-28 ENCOUNTER — Encounter: Payer: Self-pay | Admitting: Internal Medicine

## 2017-12-28 DIAGNOSIS — L0291 Cutaneous abscess, unspecified: Secondary | ICD-10-CM | POA: Insufficient documentation

## 2017-12-28 NOTE — Assessment & Plan Note (Signed)
Rx for 2 weeks bactrim for the lesions. She has some chlorhexidine rinse at home and will use.

## 2018-01-05 ENCOUNTER — Other Ambulatory Visit: Payer: Self-pay | Admitting: Internal Medicine

## 2018-01-05 DIAGNOSIS — I5022 Chronic systolic (congestive) heart failure: Secondary | ICD-10-CM

## 2018-02-14 ENCOUNTER — Ambulatory Visit: Payer: Managed Care, Other (non HMO) | Admitting: Family

## 2018-02-14 ENCOUNTER — Encounter: Payer: Self-pay | Admitting: Family

## 2018-02-14 VITALS — BP 128/72 | HR 89 | Temp 100.3°F | Ht 66.0 in | Wt 199.1 lb

## 2018-02-14 DIAGNOSIS — R6889 Other general symptoms and signs: Secondary | ICD-10-CM

## 2018-02-14 DIAGNOSIS — J209 Acute bronchitis, unspecified: Secondary | ICD-10-CM | POA: Diagnosis not present

## 2018-02-14 MED ORDER — FLUTICASONE PROPIONATE 50 MCG/ACT NA SUSP: 2.0000 | Freq: Every day | NASAL | 6 refills | Status: DC

## 2018-02-14 MED ORDER — AZITHROMYCIN 250 MG PO TABS: ORAL_TABLET | ORAL | 0 refills | Status: DC

## 2018-02-14 NOTE — Progress Notes (Signed)
[name] is a 62 y.o. female with the following history as recorded in EpicCare:  Patient Active Problem List   Diagnosis Date Noted  . Abscess 12/28/2017  . Routine general medical examination at a health care facility 07/26/2017  . Osteoarthritis of right knee 12/14/2016  . Primary osteoarthritis of left knee 06/29/2016  . Diabetes mellitus type 2, uncomplicated ([place]) 57/32/2025  . GERD (gastroesophageal reflux disease) 04/16/2015  . Constipation 04/16/2015  . Chronic venous insufficiency 02/10/2015  . Morbid obesity ([place]) 02/10/2015  . OSA (obstructive sleep apnea) 09/05/2012  . Essential hypertension, benign 12/31/2011    Current Outpatient Medications  Medication Sig Dispense Refill  . ALREX 0.2 % SUSP   0  . amLODipine (NORVASC) 10 MG tablet TAKE 1 TABLET(10 MG) BY MOUTH DAILY 90 tablet 1  . diclofenac (VOLTAREN) 75 MG EC tablet TAKE 1 TABLET(75 MG) BY MOUTH TWICE DAILY 60 tablet 3  . MAGNESIUM PO Take 1 tablet by mouth daily.    . metFORMIN (GLUCOPHAGE) 500 MG tablet Take 1 tablet (500 mg total) by mouth 2 (two) times daily with a meal. 180 tablet 1  . methocarbamol (ROBAXIN) 500 MG tablet Take 1 tablet (500 mg total) by mouth every 8 (eight) hours as needed for muscle spasms. 60 tablet 0  . azithromycin (ZITHROMAX) 250 MG tablet 2 tabs po qd x 1 day; 1 tablet per day x 4 days; 6 tablet 0  . fluticasone (FLONASE) 50 MCG/ACT nasal spray Place 2 sprays into both nostrils daily. 16 g 6  . furosemide (LASIX) 20 MG tablet TAKE 1 TABLET BY MOUTH AS NEEDED. TAKE WITH 20 MEQ POTASSIUM (Patient not taking: Reported on 07/24/2017) 30 tablet 0  . spironolactone (ALDACTONE) 25 MG tablet TAKE 1 TABLET(25 MG) BY MOUTH DAILY 90 tablet 1  . sulfamethoxazole-trimethoprim (BACTRIM DS,SEPTRA DS) 800-160 MG tablet Take 1 tablet by mouth 2 (two) times daily. 28 tablet 0  . TURMERIC PO Take 1 tablet by mouth daily.     No current facility-administered medications for this visit.      Allergies: Patient has no known allergies.  Past Medical History:  Diagnosis Date  . Breast cancer [facility]) 2011   right breast with axillary lymph node removal  . Diabetes mellitus, type II ([place])    type 2  . GERD (gastroesophageal reflux disease)   . Hypertension   . Osteoarthritis of both knees   . Personal history of chemotherapy   . Personal history of radiation therapy   . Pneumonia    in early 1980's    Past Surgical History:  Procedure Laterality Date  . ABDOMINAL HYSTERECTOMY    . APPENDECTOMY  1978  . BLADDER SUSPENSION    . BREAST LUMPECTOMY Right    2011  . KNEE ARTHROPLASTY Left 06/29/2016   Procedure: LEFT TOTAL KNEE WITH NAVIGATION;  Surgeon: Rod Can, MD;  Location: [facility];  Service: Orthopedics;  Laterality: Left;  . KNEE ARTHROPLASTY Right 12/14/2016   Procedure: RIGHT TOTAL KNEE ARTHROPLASTY WITH COMPUTER NAVIGATION;  Surgeon: Rod Can, MD;  Location: [facility];  Service: Orthopedics;  Laterality: Right;  Needs RNFA  . MENISCUS REPAIR     right knee  . OOPHORECTOMY      Family History  Adopted: Yes    Social History   Tobacco Use  . Smoking status: Former Smoker    Packs/day: 0.50    Years: 20.00    Pack years: 10.00    Types: Cigarettes    Last  attempt to quit: 10/14/2013    Years since quitting: 4.3  . Smokeless tobacco: Never Used  Substance Use Topics  . Alcohol use: Yes    Comment: rarely    Subjective:  Started with flu-like symptoms earlier this week; symptoms started suddenly on Monday;  feels like symptoms have progressively worsened as the week has gone on;  Denies any recent tick bites; + sore throat; + feverish; + cough; using OTC Coricidin HBP with some relief;   Objective:  Vitals:   02/14/18 1544  BP: 128/72  Pulse: 89  Temp: 100.3 F (37.9 C)  TempSrc: Oral  SpO2: 98%  Weight: 199 lb 1.3 oz (90.3 kg)  Height: 5\' 6"  (1.676 m)    General: Well developed, well nourished, in no acute distress  Skin : Warm and dry.   Head: Normocephalic and atraumatic  Eyes: Sclera and conjunctiva clear; pupils round and reactive to light; extraocular movements intact  Ears: External normal; canals clear; tympanic membranes normal  Oropharynx: Pink, supple. No suspicious lesions  Neck: Supple without thyromegaly, adenopathy  Lungs: Respirations unlabored; clear to auscultation bilaterally without wheeze, rales, rhonchi  CVS exam: normal rate and regular rhythm.  Neurologic: Alert and oriented; speech intact; face symmetrical; moves all extremities well; CNII-XII intact without focal deficit   Assessment:  1. Flu-like symptoms   2. Acute bronchitis, unspecified organism     Plan:  Patient is out of the 48 hour window for Tamiflu; will treat with Z-pak #1 take as directed for secondary infection; increase fluids, rest and follow-up worse, no better. Work note given for Thursday and Friday as requested.   No follow-ups on file.  No orders of the defined types were placed in this encounter.   Requested Prescriptions   Signed Prescriptions Disp Refills  . azithromycin (ZITHROMAX) 250 MG tablet 6 tablet 0    Sig: 2 tabs po qd x 1 day; 1 tablet per day x 4 days;  . fluticasone (FLONASE) 50 MCG/ACT nasal spray 16 g 6    Sig: Place 2 sprays into both nostrils daily.

## 2018-04-01 ENCOUNTER — Other Ambulatory Visit: Payer: Self-pay | Admitting: Internal Medicine

## 2018-04-02 ENCOUNTER — Other Ambulatory Visit: Payer: Self-pay | Admitting: Internal Medicine

## 2018-04-02 ENCOUNTER — Encounter: Payer: Self-pay | Admitting: Internal Medicine

## 2018-04-02 MED ORDER — METHOCARBAMOL 500 MG PO TABS: 500.0000 mg | ORAL_TABLET | Freq: Three times a day (TID) | ORAL | 0 refills | Status: DC | PRN

## 2018-06-15 ENCOUNTER — Other Ambulatory Visit: Payer: Self-pay | Admitting: Internal Medicine

## 2018-07-10 ENCOUNTER — Ambulatory Visit: Payer: Managed Care, Other (non HMO) | Admitting: Internal Medicine

## 2018-07-10 ENCOUNTER — Ambulatory Visit (INDEPENDENT_AMBULATORY_CARE_PROVIDER_SITE_OTHER)
Admission: RE | Admit: 2018-07-10 | Discharge: 2018-07-10 | Disposition: A | Discharge status: Home / Self Care | Payer: Managed Care, Other (non HMO) | Source: Ambulatory Visit | Attending: Internal Medicine | Admitting: Internal Medicine

## 2018-07-10 ENCOUNTER — Encounter: Payer: Self-pay | Admitting: Internal Medicine

## 2018-07-10 VITALS — BP 126/84 | HR 64 | Temp 98.0°F | Resp 16 | Ht 66.0 in | Wt 206.0 lb

## 2018-07-10 DIAGNOSIS — M549 Dorsalgia, unspecified: Secondary | ICD-10-CM

## 2018-07-10 DIAGNOSIS — M79609 Pain in unspecified limb: Secondary | ICD-10-CM

## 2018-07-10 MED ORDER — GABAPENTIN 100 MG PO CAPS: 100.0000 mg | ORAL_CAPSULE | Freq: Two times a day (BID) | ORAL | 3 refills | Status: DC

## 2018-07-10 MED ORDER — HYDROCODONE-ACETAMINOPHEN 5-325 MG PO TABS: 1.0000 | ORAL_TABLET | Freq: Four times a day (QID) | ORAL | 0 refills | Status: DC | PRN

## 2018-07-10 MED ORDER — METHYLPREDNISOLONE ACETATE 80 MG/ML IJ SUSP
80.0000 mg | Freq: Once | INTRAMUSCULAR | Status: AC
  Administered 2018-07-10: 80 mg via INTRAMUSCULAR

## 2018-07-10 NOTE — Progress Notes (Signed)
Subjective:    Patient ID: [name], female    DOB: 1956/02/19, 62 y.o.   MRN: [id number]  HPI The patient is here for an acute visit.   Right lower back pain and radiation down right leg.  It started 2 weeks ago.  Prior to the pain starting she did feel a catch initially and then soon after started radiating down the right leg.  She sometimes has pain down her left leg as well, but primarily she feels it in the right leg.  She has pain across her lower back and in her buttock regions bilaterally.  She is intermittent numbness in her toes of both feet.  The pain has gotten worse, especially in the past couple of days.  She does not have any pain with sitting or laying.  She has a fairly constant dull pain with walking that she rates as a 6-7/10.  Intermittently while she is walking she will have a very transient sharp pain down her leg that can reach a 40/10.  She has tried taking Tylenol and Robaxin, but this is not helped much.   She did have similar symptoms several years ago and had an MRI of her lumbar spine.   MRI LUMBAR SPINE WITHOUT CONTRAST 02/20/2011  Technique:  Multiplanar and multiecho pulse sequences of the lumbar spine were obtained without intravenous contrast.  Comparison: 08/02/2007  Findings: As on the prior exam, the lowest full intervertebral disk space is labeled L5-S1.  If procedural intervention is to be performed, careful correlation with this numbering strategy is recommended.  The conus medullaris appears unremarkable.  Conus level:  L1-2.  No significant vertebral subluxation.  Inversion recovery weighted images demonstrate no significant abnormal vertebral or periligamentous edema.  Intervertebral disc desiccation is noted at L4-5.  Additional findings at individual levels are as follows:  L2-3:  Unremarkable.  L3-4:  Mild stable facet arthropathy noted with mild disc bulge. There is mild left and borderline right foraminal  stenosis.  L4-5:  Central disc protrusion noted superimposed on a mild disc bulge.  Disc trusion is mildly reduced in size compared the prior exam.  Borderline bilateral foraminal stenosis noted. The AP diameter of the thecal sac is 10 mm, borderline for central stenosis.  L5-S1:  A new right lateral recess disc protrusion causes mild to moderate impingement on the right S1 nerve root.  The disc protrusion extends cephalad in the lateral recess.  There is bilateral facet arthropathy causing mild left and moderate right foraminal stenosis.  IMPRESSION:  1.  Progressive degenerative disc disease at L5-S1 with bilateral foraminal stenosis and new right subarticular lateral recess stenosis. 2.  Borderline stenosis at L4-5. 3.  Mild left and borderline right foraminal stenosis at L3-4.  Medications and allergies reviewed with patient and updated if appropriate.  Patient Active Problem List   Diagnosis Date Noted  . Abscess 12/28/2017  . Routine general medical examination at a health care facility 07/26/2017  . Osteoarthritis of right knee 12/14/2016  . Primary osteoarthritis of left knee 06/29/2016  . Diabetes mellitus type 2, uncomplicated ([place]) 88/50/2774  . GERD (gastroesophageal reflux disease) 04/16/2015  . Constipation 04/16/2015  . Chronic venous insufficiency 02/10/2015  . Morbid obesity ([place]) 02/10/2015  . OSA (obstructive sleep apnea) 09/05/2012  . Essential hypertension, benign 12/31/2011    Current Outpatient Medications on File Prior to Visit  Medication Sig Dispense Refill  . ALREX 0.2 % SUSP   0  . amLODipine (NORVASC) 10 MG  tablet TAKE 1 TABLET(10 MG) BY MOUTH DAILY 90 tablet 1  . diclofenac (VOLTAREN) 75 MG EC tablet TAKE 1 TABLET(75 MG) BY MOUTH TWICE DAILY 60 tablet 3  . fluticasone (FLONASE) 50 MCG/ACT nasal spray Place 2 sprays into both nostrils daily. 16 g 6  . furosemide (LASIX) 20 MG tablet TAKE 1 TABLET BY MOUTH AS NEEDED. TAKE WITH 20 MEQ  POTASSIUM 30 tablet 0  . MAGNESIUM PO Take 1 tablet by mouth daily.    . metFORMIN (GLUCOPHAGE) 500 MG tablet TAKE 1 TABLET(500 MG) BY MOUTH TWICE DAILY WITH A MEAL 180 tablet 0  . methocarbamol (ROBAXIN) 500 MG tablet Take 1 tablet (500 mg total) by mouth every 8 (eight) hours as needed for muscle spasms. 60 tablet 0  . spironolactone (ALDACTONE) 25 MG tablet TAKE 1 TABLET(25 MG) BY MOUTH DAILY 90 tablet 1  . TURMERIC PO Take 1 tablet by mouth daily.    . [DISCONTINUED] Olmesartan-Amlodipine-HCTZ (TRIBENZOR) 40-5-25 MG TABS Take 1 tablet by mouth daily.    . [DISCONTINUED] potassium chloride 20 MEQ TBCR Take 20 mEq by mouth as needed. 30 tablet 6   No current facility-administered medications on file prior to visit.     Past Medical History:  Diagnosis Date  . Breast cancer [facility]) 2011   right breast with axillary lymph node removal  . Diabetes mellitus, type II ([place])    type 2  . GERD (gastroesophageal reflux disease)   . Hypertension   . Osteoarthritis of both knees   . Personal history of chemotherapy   . Personal history of radiation therapy   . Pneumonia    in early 1980's    Past Surgical History:  Procedure Laterality Date  . ABDOMINAL HYSTERECTOMY    . APPENDECTOMY  1978  . BLADDER SUSPENSION    . BREAST LUMPECTOMY Right    2011  . KNEE ARTHROPLASTY Left 06/29/2016   Procedure: LEFT TOTAL KNEE WITH NAVIGATION;  Surgeon: Rod Can, MD;  Location: [facility];  Service: Orthopedics;  Laterality: Left;  . KNEE ARTHROPLASTY Right 12/14/2016   Procedure: RIGHT TOTAL KNEE ARTHROPLASTY WITH COMPUTER NAVIGATION;  Surgeon: Rod Can, MD;  Location: [facility];  Service: Orthopedics;  Laterality: Right;  Needs RNFA  . MENISCUS REPAIR     right knee  . OOPHORECTOMY      Social History   Socioeconomic History  . Marital status: Married    Spouse name: Not on file  . Number of children: 3  . Years of education: Not on file  . Highest education level: Not on file    Occupational History  . Occupation: medical records    Employer: [employer]  . Financial resource strain: Not on file  . Food insecurity:    Worry: Not on file    Inability: Not on file  . Transportation needs:    Medical: Not on file    Non-medical: Not on file  Tobacco Use  . Smoking status: Former Smoker    Packs/day: 0.50    Years: 20.00    Pack years: 10.00    Types: Cigarettes    Last attempt to quit: 10/14/2013    Years since quitting: 4.7  . Smokeless tobacco: Never Used  Substance and Sexual Activity  . Alcohol use: Yes    Comment: rarely  . Drug use: No  . Sexual activity: Not on file  Lifestyle  . Physical activity:    Days per week: Not on file  Minutes per session: Not on file  . Stress: Not on file  Relationships  . Social connections:    Talks on phone: Not on file    Gets together: Not on file    Attends religious service: Not on file    Active member of club or organization: Not on file    Attends meetings of clubs or organizations: Not on file    Relationship status: Not on file  Other Topics Concern  . Not on file  Social History Narrative  . Not on file    Family History  Adopted: Yes    Review of Systems  Constitutional: Negative for chills and fever.  Gastrointestinal:       No incontinence of bowels  Genitourinary: Positive for frequency and urgency. Negative for dysuria and hematuria.       No urinary incontinence  Musculoskeletal: Positive for back pain.  Neurological: Positive for weakness and numbness.       Objective:   Vitals:   07/10/18 0918  BP: 126/84  Pulse: 64  Resp: 16  Temp: 98 F (36.7 C)  SpO2: 98%   BP Readings from Last 3 Encounters:  07/10/18 126/84  02/14/18 128/72  12/26/17 110/80   Wt Readings from Last 3 Encounters:  07/10/18 206 lb (93.4 kg)  02/14/18 199 lb 1.3 oz (90.3 kg)  12/26/17 207 lb (93.9 kg)   Body mass index is 33.25 kg/m.   Physical Exam  Constitutional: She  appears well-developed and well-nourished. No distress.  HENT:  Head: Normocephalic and atraumatic.  Musculoskeletal: She exhibits no edema.  No tenderness across lower back with palpation, minimal lumbar spine discomfort with palpation  Neurological:  Slightly decreased sensation left toes, but sensation intact; normal sensation in lower and upper legs; normal strength bilateral lower extremities, positive straight leg raise on right> left  Skin: Skin is warm and dry. She is not diaphoretic. No erythema.           Assessment & Plan:    See Problem List for Assessment and Plan of chronic medical problems.

## 2018-07-10 NOTE — Assessment & Plan Note (Signed)
She has pain across her lower back, into her bilateral buttock regions and down both legs-right leg more than left leg Associated with intermittent numbness in her toes in both feet History of herniated disc/disc protrusion in the past-concern for similar symptoms today X-ray to rule out any major bony abnormalities Needs MRI given current symptoms and history of disc herniation/protrusion-we will order Will refer to orthopedics Depo-Medrol 80 mg IM x1 Start gabapentin 100 mg twice daily-most of her symptoms are during the day or with walking-we will increase if needed Continue diclofenac-she will hold tonight's dose and she did get the Depo-Medrol

## 2018-07-10 NOTE — Patient Instructions (Addendum)
You had a steroid injection today.  Have an xray today.   An MRI will be ordered.   Medications reviewed and updated.  Changes include :   Gabapentin 100 mg in the morning and 100 mg at bedtime.    Your prescription(s) have been submitted to your pharmacy. Please take as directed and contact our office if you believe you are having problem(s) with the medication(s).  A referral was ordered for [place].

## 2018-07-11 ENCOUNTER — Encounter: Payer: Self-pay | Admitting: Internal Medicine

## 2018-07-15 ENCOUNTER — Ambulatory Visit (INDEPENDENT_AMBULATORY_CARE_PROVIDER_SITE_OTHER): Payer: Managed Care, Other (non HMO)

## 2018-07-15 ENCOUNTER — Ambulatory Visit (INDEPENDENT_AMBULATORY_CARE_PROVIDER_SITE_OTHER): Payer: Managed Care, Other (non HMO) | Admitting: Family Medicine

## 2018-07-15 ENCOUNTER — Encounter (INDEPENDENT_AMBULATORY_CARE_PROVIDER_SITE_OTHER): Payer: Self-pay | Admitting: Family Medicine

## 2018-07-15 DIAGNOSIS — M25551 Pain in right hip: Secondary | ICD-10-CM

## 2018-07-15 MED ORDER — METHYLPREDNISOLONE ACETATE 40 MG/ML IJ SUSP: 40.0000 mg | Freq: Once | INTRAMUSCULAR | Status: DC

## 2018-07-15 NOTE — Progress Notes (Signed)
Office Visit Note   Patient: [name]           Date of Birth: [birth year]           MRN: [id number] Visit Date: 07/15/2018 Requested by: Binnie Rail, MD [place], [place] [address] PCP: Hoyt Koch, MD  Subjective: Chief Complaint  Patient presents with  . Lower Back - Pain    Low back pain - started with a "catch" 3 weeks ago. Now, for the past week, pain has been in right hip (lateral) and down the leg.  Occasionally, feels a "catch" in the right groin area.    HPI: She is here at the request of Dr. Quay Burow for right hip pain.  Pain on the posterior lateral aspect of the hip off-and-on for quite a while, but pretty severe in the past 2 or 3 weeks.  Pain occasionally radiates down the leg toward the knee and she occasionally gets a numbness/tingling sensation in her leg, but her main concern is a sharp catching pain on the lateral aspect with certain movements, especially walking and transitioning.  Pain seems to go away at rest, it does not wake her up at night.  She recently had lumbar x-rays done which we reviewed today.  She has had an MRI scan ordered but it has not been completed yet.  She occasionally has groin pain.  She had an intramuscular steroid injection last week which unfortunately has not given her much relief.  She has diabetes which has been well controlled.  Denies any bowel or bladder incontinence.              ROS: Otherwise noncontributory  Objective: Vital Signs: There were no vitals taken for this visit.  Physical Exam:  Right hip: Her lumbar spine is nontender in the midline.  Slight tenderness over the right sacroiliac joint.  Maximum tenderness is over the greater trochanter.  She has good range of motion and minimal pain with passive hip flexion and internal rotation.  Negative straight leg raise, lower extremity strength and reflexes are normal.  Imaging: Right hip x-rays: Well-preserved joint spaces in both hips with no sign of  stress fracture, no soft tissue calcifications.  No sign of neoplasm.  Assessment & Plan: 1.  Right hip pain, suspect greater trochanter syndrome but cannot rule out lumbar foraminal stenosis. -Discussed various options with her and she would like to try a greater trochanter injection.  If this does not help, she will proceed with lumbar MRI scan.  If MRI is unrevealing, then she will start physical therapy at [facility].   Follow-Up Instructions: No follow-ups on file.       Procedures: Right hip greater trochanter injection: After sterile prep with Betadine, injected 5 cc 1% lidocaine without epinephrine and 40 mg methylprednisolone using a 22-gauge spinal needle into the area of maximum tenderness at the greater trochanter.   PMFS History: Patient Active Problem List   Diagnosis Date Noted  . Pain of back and lower extremity 07/10/2018  . Abscess 12/28/2017  . Routine general medical examination at a health care facility 07/26/2017  . Osteoarthritis of right knee 12/14/2016  . Primary osteoarthritis of left knee 06/29/2016  . Diabetes mellitus type 2, uncomplicated ([place]) 76/22/6333  . GERD (gastroesophageal reflux disease) 04/16/2015  . Constipation 04/16/2015  . Chronic venous insufficiency 02/10/2015  . Morbid obesity ([place]) 02/10/2015  . OSA (obstructive sleep apnea) 09/05/2012  . Essential hypertension, benign 12/31/2011  Past Medical History:  Diagnosis Date  . Breast cancer [facility]) 2011   right breast with axillary lymph node removal  . Diabetes mellitus, type II ([place])    type 2  . GERD (gastroesophageal reflux disease)   . Hypertension   . Osteoarthritis of both knees   . Personal history of chemotherapy   . Personal history of radiation therapy   . Pneumonia    in early 1980's    Family History  Adopted: Yes    Past Surgical History:  Procedure Laterality Date  . ABDOMINAL HYSTERECTOMY    . APPENDECTOMY  1978  . BLADDER SUSPENSION    . BREAST  LUMPECTOMY Right    2011  . KNEE ARTHROPLASTY Left 06/29/2016   Procedure: LEFT TOTAL KNEE WITH NAVIGATION;  Surgeon: Rod Can, MD;  [facility];  Service: Orthopedics;  Laterality: Left;  . KNEE ARTHROPLASTY Right 12/14/2016   Procedure: RIGHT TOTAL KNEE ARTHROPLASTY WITH COMPUTER NAVIGATION;  Surgeon: Rod Can, MD;  [facility];  Service: Orthopedics;  Laterality: Right;  Needs RNFA  . MENISCUS REPAIR     right knee  . OOPHORECTOMY     Social History   Occupational History  . Occupation: medical records    Employer: [employer]  Tobacco Use  . Smoking status: Former Smoker    Packs/day: 0.50    Years: 20.00    Pack years: 10.00    Types: Cigarettes    Last attempt to quit: 10/14/2013    Years since quitting: 4.7  . Smokeless tobacco: Never Used  Substance and Sexual Activity  . Alcohol use: Yes    Comment: rarely  . Drug use: No  . Sexual activity: Not on file

## 2018-07-21 ENCOUNTER — Ambulatory Visit
Admission: RE | Admit: 2018-07-21 | Discharge: 2018-07-21 | Disposition: A | Discharge status: Home / Self Care | Payer: Managed Care, Other (non HMO) | Source: Ambulatory Visit | Attending: Internal Medicine | Admitting: Internal Medicine

## 2018-07-21 DIAGNOSIS — M79609 Pain in unspecified limb: Principal | ICD-10-CM

## 2018-07-21 DIAGNOSIS — M549 Dorsalgia, unspecified: Secondary | ICD-10-CM

## 2018-07-22 ENCOUNTER — Encounter: Payer: Self-pay | Admitting: Internal Medicine

## 2018-07-22 ENCOUNTER — Other Ambulatory Visit: Payer: Self-pay | Admitting: Internal Medicine

## 2018-07-22 DIAGNOSIS — I5022 Chronic systolic (congestive) heart failure: Secondary | ICD-10-CM

## 2018-07-23 NOTE — Telephone Encounter (Deleted)
Copied from [place] [id number]. Topic: Quick Communication - Lab Results (Clinic Use ONLY) >> Jul 22, 2018  2:36 PM Melene Plan, CMA wrote: LVM for pt to call back and discuss results with assistant. >> Jul 23, 2018  8:41 AM Reyne Dumas L wrote: Pt returned call to get results.

## 2018-07-24 ENCOUNTER — Encounter (INDEPENDENT_AMBULATORY_CARE_PROVIDER_SITE_OTHER): Payer: Self-pay | Admitting: Family Medicine

## 2018-07-24 ENCOUNTER — Ambulatory Visit (INDEPENDENT_AMBULATORY_CARE_PROVIDER_SITE_OTHER): Payer: Managed Care, Other (non HMO) | Admitting: Family Medicine

## 2018-07-24 ENCOUNTER — Other Ambulatory Visit (INDEPENDENT_AMBULATORY_CARE_PROVIDER_SITE_OTHER): Payer: Self-pay | Admitting: Family Medicine

## 2018-07-24 DIAGNOSIS — M47816 Spondylosis without myelopathy or radiculopathy, lumbar region: Secondary | ICD-10-CM

## 2018-07-24 DIAGNOSIS — M25551 Pain in right hip: Secondary | ICD-10-CM

## 2018-07-24 NOTE — Progress Notes (Signed)
Office Visit Note   Patient: [name]           Date of Birth: [birth year]           MRN: [id number] Visit Date: 07/24/2018 Requested by: Hoyt Koch, MD [place], [place] [id number] PCP: Hoyt Koch, MD  Subjective: Chief Complaint  Patient presents with  . Lower Back - Pain, Follow-up    MRI review - feeling no better    HPI: She is here for follow-up right sided low back and leg pain.  Unfortunately she got no relief from greater trochanter injection.  She went ahead with lumbar MRI scan and is here to discuss the results.              ROS: Noncontributory  Objective: Vital Signs: There were no vitals taken for this visit.  Physical Exam:  Still has some pain near the greater trochanter.  Imaging: MRI reviewed on computer.  She has a right sided L3-4 probable synovial cyst causing moderate spinal stenosis.  Assessment & Plan: 1.  Right-sided low back and leg pain, probably due to L3-4 synovial cyst causing stenosis -I will ask Dr. Ernestina Patches whether this might be amenable to injection.  If so, patient wants to proceed as soon as able.  If not, then surgical consult.   Follow-Up Instructions: No follow-ups on file.       Procedures: None today.   PMFS History: Patient Active Problem List   Diagnosis Date Noted  . Pain of back and lower extremity 07/10/2018  . Abscess 12/28/2017  . Routine general medical examination at a health care facility 07/26/2017  . Osteoarthritis of right knee 12/14/2016  . Primary osteoarthritis of left knee 06/29/2016  . Diabetes mellitus type 2, uncomplicated ([place]) 68/08/5725  . GERD (gastroesophageal reflux disease) 04/16/2015  . Constipation 04/16/2015  . Chronic venous insufficiency 02/10/2015  . Morbid obesity ([place]) 02/10/2015  . OSA (obstructive sleep apnea) 09/05/2012  . Essential hypertension, benign 12/31/2011   Past Medical History:  Diagnosis Date  . Breast cancer [facility]) 2011   right breast with axillary lymph node removal  . Diabetes mellitus, type II ([place])    type 2  . GERD (gastroesophageal reflux disease)   . Hypertension   . Osteoarthritis of both knees   . Personal history of chemotherapy   . Personal history of radiation therapy   . Pneumonia    in early 1980's    Family History  Adopted: Yes    Past Surgical History:  Procedure Laterality Date  . ABDOMINAL HYSTERECTOMY    . APPENDECTOMY  1978  . BLADDER SUSPENSION    . BREAST LUMPECTOMY Right    2011  . KNEE ARTHROPLASTY Left 06/29/2016   Procedure: LEFT TOTAL KNEE WITH NAVIGATION;  Surgeon: Rod Can, MD;  [facility];  Service: Orthopedics;  Laterality: Left;  . KNEE ARTHROPLASTY Right 12/14/2016   Procedure: RIGHT TOTAL KNEE ARTHROPLASTY WITH COMPUTER NAVIGATION;  Surgeon: Rod Can, MD;  [facility];  Service: Orthopedics;  Laterality: Right;  Needs RNFA  . MENISCUS REPAIR     right knee  . OOPHORECTOMY     Social History   Occupational History  . Occupation: medical records    Employer: [employer]  Tobacco Use  . Smoking status: Former Smoker    Packs/day: 0.50    Years: 20.00    Pack years: 10.00    Types: Cigarettes    Last attempt to quit:  10/14/2013    Years since quitting: 4.7  . Smokeless tobacco: Never Used  Substance and Sexual Activity  . Alcohol use: Yes    Comment: rarely  . Drug use: No  . Sexual activity: Not on file

## 2018-07-30 ENCOUNTER — Telehealth (INDEPENDENT_AMBULATORY_CARE_PROVIDER_SITE_OTHER): Payer: Self-pay

## 2018-07-30 NOTE — Telephone Encounter (Signed)
[place] with [employer] called stating that patient authorization's for Lumbar Facet Arthropathy with Dr. Ernestina Patches had been denied and stated that the next step would be a peer to peer with Dr. Junius Roads.  Cb# for Christella Scheuermann would be [phone].  Please advise.  Thank You.

## 2018-07-30 NOTE — Telephone Encounter (Signed)
We tried to schedule a time with [employer] for this peer-to-peer on Monday, but was told Dr. Junius Roads would need to call.  They were told it would probably be the afternoon of 08/05/18.

## 2018-08-02 NOTE — Telephone Encounter (Signed)
I called and requested a reconsideration.  They will let us know in a few days.

## 2018-08-02 NOTE — Telephone Encounter (Signed)
Left full message on patient's mobile voice mail, informing her of this.

## 2018-08-06 ENCOUNTER — Encounter (INDEPENDENT_AMBULATORY_CARE_PROVIDER_SITE_OTHER): Payer: Self-pay | Admitting: Family Medicine

## 2018-08-07 ENCOUNTER — Other Ambulatory Visit (INDEPENDENT_AMBULATORY_CARE_PROVIDER_SITE_OTHER): Payer: Self-pay | Admitting: Family Medicine

## 2018-08-07 ENCOUNTER — Telehealth (INDEPENDENT_AMBULATORY_CARE_PROVIDER_SITE_OTHER): Payer: Self-pay

## 2018-08-07 MED ORDER — OXYCODONE-ACETAMINOPHEN 5-325 MG PO TABS: 1.0000 | ORAL_TABLET | Freq: Every evening | ORAL | 0 refills | Status: DC | PRN

## 2018-08-07 NOTE — Telephone Encounter (Signed)
-----   Message from Eunice Blase, MD sent at 08/07/2018 11:07 AM EST ----- Regarding: Lorin Mercy referral Please schedule consult with Dr. Lorin Mercy for lumbar facet cyst causing stenosis.

## 2018-08-08 NOTE — Telephone Encounter (Signed)
I called patient and scheduled an appointment on 08/20/18 at 3:00 pm with Dr. Lorin Mercy (1st available).

## 2018-08-20 ENCOUNTER — Ambulatory Visit (INDEPENDENT_AMBULATORY_CARE_PROVIDER_SITE_OTHER): Payer: Managed Care, Other (non HMO) | Admitting: Orthopaedic Surgery

## 2018-08-20 ENCOUNTER — Encounter (INDEPENDENT_AMBULATORY_CARE_PROVIDER_SITE_OTHER): Payer: Self-pay | Admitting: Orthopaedic Surgery

## 2018-08-20 VITALS — BP 124/89 | HR 68 | Ht 66.0 in | Wt 199.0 lb

## 2018-08-20 DIAGNOSIS — M48062 Spinal stenosis, lumbar region with neurogenic claudication: Secondary | ICD-10-CM

## 2018-08-21 ENCOUNTER — Encounter (INDEPENDENT_AMBULATORY_CARE_PROVIDER_SITE_OTHER): Payer: Self-pay | Admitting: Orthopaedic Surgery

## 2018-08-21 NOTE — Pre-Procedure Instructions (Signed)
[name]  08/21/2018      [id number] - [place], [address] [place] [address] Phone: [phone] Fax: [phone]    Your procedure is scheduled on Monday November 25th.  Report to [facility] Admitting at 1030 A.M.  Call this number if you have problems the morning of surgery:  [phone]   Remember:  Do not eat or drink after midnight.    Take these medicines the morning of surgery with A SIP OF WATER   acetaminophen (TYLENOL)  If needed  amLODipine (NORVASC)  fluticasone (FLONASE) if needed  gabapentin (NEURONTIN) if needed  methocarbamol (ROBAXIN)  If needed    7 days prior to surgery STOP taking any diclofenac (VOLTAREN), Aspirin(unless otherwise instructed by your surgeon), Aleve, Naproxen, Ibuprofen, Motrin, Advil, Goody's, BC's, all herbal medications, fish oil, and all vitamins   WHAT DO I DO ABOUT MY DIABETES MEDICATION?   Marland Kitchen Do not take oral diabetes medicines (pills) the morning of surgery:  metFORMIN (GLUCOPHAGE.  Marland Kitchen The day of surgery, do not take other diabetes injectables, including Byetta (exenatide), Bydureon (exenatide ER), Victoza (liraglutide), or Trulicity (dulaglutide).  . If your CBG is greater than 220 mg/dL, you may take  of your sliding scale (correction) dose of insulin.   How to Manage Your Diabetes Before and After Surgery  Why is it important to control my blood sugar before and after surgery? . Improving blood sugar levels before and after surgery helps healing and can limit problems. . A way of improving blood sugar control is eating a healthy diet by: o  Eating less sugar and carbohydrates o  Increasing activity/exercise o  Talking with your doctor about reaching your blood sugar goals . High blood sugars (greater than 180 mg/dL) can raise your risk of infections and slow your recovery, so you will need to focus on  controlling your diabetes during the weeks before surgery. . Make sure that the doctor who takes care of your diabetes knows about your planned surgery including the date and location.  How do I manage my blood sugar before surgery? . Check your blood sugar at least 4 times a day, starting 2 days before surgery, to make sure that the level is not too high or low. o Check your blood sugar the morning of your surgery when you wake up and every 2 hours until you get to the Short Stay unit. . If your blood sugar is less than 70 mg/dL, you will need to treat for low blood sugar: o Do not take insulin. o Treat a low blood sugar (less than 70 mg/dL) with  cup of clear juice (cranberry or apple), 4 glucose tablets, OR glucose gel. o Recheck blood sugar in 15 minutes after treatment (to make sure it is greater than 70 mg/dL). If your blood sugar is not greater than 70 mg/dL on recheck, call [phone] for further instructions. . Report your blood sugar to the short stay nurse when you get to Short Stay.  . If you are admitted to the hospital after surgery: o Your blood sugar will be checked by the staff and you will probably be given insulin after surgery (instead of oral diabetes medicines) to make sure you have good blood sugar levels. o The goal for blood sugar control after surgery is 80-180 mg/dL.    Do not wear jewelry, make-up  or nail polish.  Do not wear lotions, powders, or perfumes, or deodorant.  Do not shave 48 hours prior to surgery.  Men may shave face and neck.  Do not bring valuables to the hospital.  [facility] is not responsible for any belongings or valuables.  Contacts, dentures or bridgework may not be worn into surgery.  Leave your suitcase in the car.  After surgery it may be brought to your room.  For patients admitted to the hospital, discharge time will be determined by your treatment team.  Patients discharged the day of surgery will not be allowed to drive home.     [facility]- Preparing For Surgery  Before surgery, you can play an important role. Because skin is not sterile, your skin needs to be as free of germs as possible. You can reduce the number of germs on your skin by washing with CHG (chlorahexidine gluconate) Soap before surgery.  CHG is an antiseptic cleaner which kills germs and bonds with the skin to continue killing germs even after washing.    Oral Hygiene is also important to reduce your risk of infection.  Remember - BRUSH YOUR TEETH THE MORNING OF SURGERY WITH YOUR REGULAR TOOTHPASTE  Please do not use if you have an allergy to CHG or antibacterial soaps. If your skin becomes reddened/irritated stop using the CHG.  Do not shave (including legs and underarms) for at least 48 hours prior to first CHG shower. It is OK to shave your face.  Please follow these instructions carefully.   1. Shower the NIGHT BEFORE SURGERY and the MORNING OF SURGERY with CHG.   2. If you chose to wash your hair, wash your hair first as usual with your normal shampoo.  3. After you shampoo, rinse your hair and body thoroughly to remove the shampoo.  4. Use CHG as you would any other liquid soap. You can apply CHG directly to the skin and wash gently with a scrungie or a clean washcloth.   5. Apply the CHG Soap to your body ONLY FROM THE NECK DOWN.  Do not use on open wounds or open sores. Avoid contact with your eyes, ears, mouth and genitals (private parts). Wash Face and genitals (private parts)  with your normal soap.  6. Wash thoroughly, paying special attention to the area where your surgery will be performed.  7. Thoroughly rinse your body with warm water from the neck down.  8. DO NOT shower/wash with your normal soap after using and rinsing off the CHG Soap.  9. Pat yourself dry with a CLEAN TOWEL.  10. Wear CLEAN PAJAMAS to bed the night before surgery, wear comfortable clothes the morning of surgery  11. Place CLEAN SHEETS on your bed  the night of your first shower and DO NOT SLEEP WITH PETS.    Day of Surgery:  Do not apply any deodorants/lotions.  Please wear clean clothes to the hospital/surgery center.   Remember to brush your teeth WITH YOUR REGULAR TOOTHPASTE.    Please read over the following fact sheets that you were given.

## 2018-08-21 NOTE — Progress Notes (Signed)
Office Visit Note   Patient: [name]           Date of Birth: April 01, 1956           MRN: [id number] Visit Date: 08/20/2018              Requested by: Hoyt Koch, MD [place], [place] [address] PCP: Hoyt Koch, MD   Assessment & Plan: Visit Diagnoses:  1. Spinal stenosis of lumbar region with neurogenic claudication           And right radiculopathy with intraspinal extradural facet cyst.  Plan: Patient has symptomatic spinal stenosis with nerve root compression secondary to intraspinal extradural facet cyst on the right at the L3-4 level.  This is causing lateral recess stenosis without foraminal stenosis.  Plan to be single level decompression excision of the intraspinal extradural facet cyst at the L3-4 level with use of the operative microscope.  (CPT [id number] and [phone].)  We discussed operative technique overnight stay, several weeks out of work after the procedure with a walking program avoiding lifting turning and twisting for several weeks.  Risk of dural tear, cyst recurrence, progressive instability with need for fusion was discussed in detail.  Questions were elicited and answered she understands and requests we proceed.  MRI scans were reviewed with patient I gave her a copy of the report.  Decision for surgery was made today.  Follow-Up Instructions: No follow-ups on file.   Orders:  No orders of the defined types were placed in this encounter.  No orders of the defined types were placed in this encounter.     Procedures: No procedures performed   Clinical Data: No additional findings.   Subjective: Chief Complaint  Patient presents with  . Lower Back - Pain    HPI patient is seen by me at the request of Dr. Eunice Blase for consideration of surgical intervention for a large facet cyst versus disc fragment causing moderate spinal stenosis with radiculopathy right leg.  She is had some pain off and on radiating into her  right hip for many months and the last 3 weeks has become progressively severe with turning and twisting she feels leg weakness and is concerned her leg will give way.  Denies night pain.  She is had previous steroid injection without relief.  She has diabetes which is well controlled.  No bladder or bowel symptoms.  History of breast cancer 2010.  She been on oxycodone at night taking gabapentin without relief.  MRI scan 07/21/2018 showed large 9 x 6 x 11 mm intraspinal extradural mass most consistent with facet cyst versus sequestered disc fragment causing mass-effect on the right side of the dural sac and lateral recess stenosis with compression of the nerve root.  No neuroforaminal stenosis.  This is a new finding from comparison MRI 02/20/2011. Patient works in [employer].  Patient has increased pain with standing difficulty walking long distance with increased pain.  With rotation she feels sharp pain that radiates from the right buttocks into the right thigh region with increased numbness and quad weakness. Trochanteric injection 07/15/2018 did not give her relief.  Request for epidural was denied x2.  Review of Systems positive for bilateral total knee arthroplasties 2018.  Positive for GERD, sleep apnea, type 2 diabetes uncomplicated.  Venous insufficiency, past history of morbid obesity with weight loss now BMI 32.  Breast cancer 2011 with chemo and radiation.  Previous bladder suspension, appendectomy 1978,  nephrectomy.  Negative cardiovascular respiratory endocrine.  14 point review of systems otherwise negative.   Objective: Vital Signs: BP 124/89   Pulse 68   Ht 5\' 6"  (1.676 m)   Wt 199 lb (90.3 kg)   BMI 32.12 kg/m   Physical Exam  Constitutional: She is oriented to person, place, and time. She appears well-developed.  HENT:  Head: Normocephalic.  Right Ear: External ear normal.  Left Ear: External ear normal.  Eyes: Pupils are equal, round, and reactive to light.    Neck: No tracheal deviation present. No thyromegaly present.  Cardiovascular: Normal rate.  Pulmonary/Chest: Effort normal.  Abdominal: Soft.  Neurological: She is alert and oriented to person, place, and time.  Skin: Skin is warm and dry.  Psychiatric: She has a normal mood and affect. Her behavior is normal.    Ortho Exam pelvis is level patient has negative logroll to the hips.  Well-healed right and left total knee arthroplasty incisions.  Tenderness of the right greater trochanter.  Negative straight leg raising.  Decreased sensation over right thigh.  Normal on the left.  Specialty Comments:  No specialty comments available.  Imaging: CLINICAL DATA:  Low back pain and bilateral leg pain.  EXAM: MRI LUMBAR SPINE WITHOUT CONTRAST  TECHNIQUE: Multiplanar, multisequence MR imaging of the lumbar spine was performed. No intravenous contrast was administered.  COMPARISON:  Lumbar spine radiograph 07/10/2018  Lumbar spine MRI 02/20/2011  FINDINGS: Segmentation: Normal. The lowest disc space is considered to be L5-S1.  Alignment:  Normal  Vertebrae: No acute compression fracture, discitis-osteomyelitis of focal marrow lesion.  Conus medullaris and cauda equina: The conus medullaris terminates at the L1 level. The cauda equina and conus medullaris are both normal.  Paraspinal and other soft tissues: The visualized aorta, IVC and iliac vessels are normal. The visualized retroperitoneal organs and paraspinal soft tissues are normal.  Disc levels: Sagittal plane imaging includes the T10-11 disc level through the upper sacrum, with axial imaging of the T11-12 to L5-S1 disc levels.  T10-L3 levels are normal.  L3-L4: Small disc bulge. Moderate facet hypertrophy. There is a medially projecting, low T1/intermediate T2-weighted signal focus adjacent to the medial aspect of the right L3-4 facet joint. This measures 9 x 6 x 11 mm. This causes moderate spinal canal  stenosis with crowding of the cauda equina nerve roots. Mass effect is greatest on the descending right-sided nerve roots in the posterior right lateral recess. There is no neural foraminal stenosis.  L4-L5: Small central disc protrusion with annular fissure, unchanged. No spinal canal stenosis. No neural foraminal stenosis.  L5-S1: Moderate facet hypertrophy and small central disc protrusion with annular fissure, unchanged. There is mild narrowing of the right lateral recess. No spinal canal stenosis. No foraminal stenosis.  The visualized portion of the sacrum is normal.  IMPRESSION: 1. New medially projecting lesion adjacent to the right L3-4 facet joint, most consistent with synovial cyst (favored) or sequestered disc fragment. This causes moderate spinal canal stenosis and exerts mass effect on the descending right-sided nerve roots in the posterior right lateral recess. 2. Unchanged small central disc protrusions at L4-5 and L5-S1. Mild L5-S1 right lateral recess narrowing, unchanged. Correlate for right S1 radiculopathy.   Electronically Signed   By: Ulyses Jarred M.D.   On: 07/22/2018 04:59    PMFS History: Patient Active Problem List   Diagnosis Date Noted  . Pain of back and lower extremity 07/10/2018  . Abscess 12/28/2017  . Routine general medical examination at  a health care facility 07/26/2017  . Osteoarthritis of right knee 12/14/2016  . Primary osteoarthritis of left knee 06/29/2016  . Diabetes mellitus type 2, uncomplicated ([place]) 26/41/5830  . GERD (gastroesophageal reflux disease) 04/16/2015  . Constipation 04/16/2015  . Chronic venous insufficiency 02/10/2015  . Morbid obesity ([place]) 02/10/2015  . OSA (obstructive sleep apnea) 09/05/2012  . Essential hypertension, benign 12/31/2011   Past Medical History:  Diagnosis Date  . Breast cancer [facility]) 2011   right breast with axillary lymph node removal  . Diabetes mellitus, type II ([place])    type  2  . GERD (gastroesophageal reflux disease)   . Hypertension   . Osteoarthritis of both knees   . Personal history of chemotherapy   . Personal history of radiation therapy   . Pneumonia    in early 1980's    Family History  Adopted: Yes    Past Surgical History:  Procedure Laterality Date  . ABDOMINAL HYSTERECTOMY    . APPENDECTOMY  1978  . BLADDER SUSPENSION    . BREAST LUMPECTOMY Right    2011  . KNEE ARTHROPLASTY Left 06/29/2016   Procedure: LEFT TOTAL KNEE WITH NAVIGATION;  Surgeon: Rod Can, MD;  Location: [facility];  Service: Orthopedics;  Laterality: Left;  . KNEE ARTHROPLASTY Right 12/14/2016   Procedure: RIGHT TOTAL KNEE ARTHROPLASTY WITH COMPUTER NAVIGATION;  Surgeon: Rod Can, MD;  Location: [facility];  Service: Orthopedics;  Laterality: Right;  Needs RNFA  . MENISCUS REPAIR     right knee  . OOPHORECTOMY     Social History   Occupational History  . Occupation: medical records    Employer: [employer]  Tobacco Use  . Smoking status: Former Smoker    Packs/day: 0.50    Years: 20.00    Pack years: 10.00    Types: Cigarettes    Last attempt to quit: 10/14/2013    Years since quitting: 4.8  . Smokeless tobacco: Never Used  Substance and Sexual Activity  . Alcohol use: Yes    Comment: rarely  . Drug use: No  . Sexual activity: Not on file

## 2018-08-22 ENCOUNTER — Encounter (HOSPITAL_COMMUNITY)
Admission: RE | Admit: 2018-08-22 | Discharge: 2018-08-22 | Disposition: A | Discharge status: Home / Self Care | Payer: Managed Care, Other (non HMO) | Source: Ambulatory Visit | Attending: Orthopaedic Surgery | Admitting: Orthopaedic Surgery

## 2018-08-22 ENCOUNTER — Encounter (HOSPITAL_COMMUNITY): Payer: Self-pay

## 2018-08-22 ENCOUNTER — Telehealth (INDEPENDENT_AMBULATORY_CARE_PROVIDER_SITE_OTHER): Payer: Self-pay | Admitting: Radiology

## 2018-08-22 DIAGNOSIS — Z923 Personal history of irradiation: Secondary | ICD-10-CM | POA: Diagnosis not present

## 2018-08-22 DIAGNOSIS — I4510 Unspecified right bundle-branch block: Secondary | ICD-10-CM

## 2018-08-22 DIAGNOSIS — Z7984 Long term (current) use of oral hypoglycemic drugs: Secondary | ICD-10-CM | POA: Diagnosis not present

## 2018-08-22 DIAGNOSIS — R9431 Abnormal electrocardiogram [ECG] [EKG]: Secondary | ICD-10-CM | POA: Insufficient documentation

## 2018-08-22 DIAGNOSIS — M1990 Unspecified osteoarthritis, unspecified site: Secondary | ICD-10-CM | POA: Diagnosis not present

## 2018-08-22 DIAGNOSIS — I10 Essential (primary) hypertension: Secondary | ICD-10-CM | POA: Diagnosis not present

## 2018-08-22 DIAGNOSIS — M5116 Intervertebral disc disorders with radiculopathy, lumbar region: Secondary | ICD-10-CM | POA: Diagnosis present

## 2018-08-22 DIAGNOSIS — Z9221 Personal history of antineoplastic chemotherapy: Secondary | ICD-10-CM | POA: Diagnosis not present

## 2018-08-22 DIAGNOSIS — E119 Type 2 diabetes mellitus without complications: Secondary | ICD-10-CM | POA: Diagnosis not present

## 2018-08-22 DIAGNOSIS — Z87891 Personal history of nicotine dependence: Secondary | ICD-10-CM | POA: Diagnosis not present

## 2018-08-22 DIAGNOSIS — Z6833 Body mass index (BMI) 33.0-33.9, adult: Secondary | ICD-10-CM | POA: Diagnosis not present

## 2018-08-22 DIAGNOSIS — G9619 Other disorders of meninges, not elsewhere classified: Secondary | ICD-10-CM | POA: Diagnosis not present

## 2018-08-22 DIAGNOSIS — I872 Venous insufficiency (chronic) (peripheral): Secondary | ICD-10-CM | POA: Diagnosis not present

## 2018-08-22 DIAGNOSIS — K219 Gastro-esophageal reflux disease without esophagitis: Secondary | ICD-10-CM | POA: Diagnosis not present

## 2018-08-22 DIAGNOSIS — I739 Peripheral vascular disease, unspecified: Secondary | ICD-10-CM | POA: Diagnosis not present

## 2018-08-22 DIAGNOSIS — M778 Other enthesopathies, not elsewhere classified: Secondary | ICD-10-CM | POA: Diagnosis not present

## 2018-08-22 DIAGNOSIS — Z853 Personal history of malignant neoplasm of breast: Secondary | ICD-10-CM | POA: Diagnosis not present

## 2018-08-22 DIAGNOSIS — Z96653 Presence of artificial knee joint, bilateral: Secondary | ICD-10-CM | POA: Diagnosis not present

## 2018-08-22 DIAGNOSIS — Z01818 Encounter for other preprocedural examination: Secondary | ICD-10-CM

## 2018-08-22 DIAGNOSIS — Z79899 Other long term (current) drug therapy: Secondary | ICD-10-CM | POA: Diagnosis not present

## 2018-08-22 DIAGNOSIS — M170 Bilateral primary osteoarthritis of knee: Secondary | ICD-10-CM | POA: Diagnosis not present

## 2018-08-22 DIAGNOSIS — G4730 Sleep apnea, unspecified: Secondary | ICD-10-CM | POA: Diagnosis not present

## 2018-08-22 DIAGNOSIS — M48062 Spinal stenosis, lumbar region with neurogenic claudication: Secondary | ICD-10-CM | POA: Diagnosis not present

## 2018-08-22 DIAGNOSIS — Z9049 Acquired absence of other specified parts of digestive tract: Secondary | ICD-10-CM | POA: Diagnosis not present

## 2018-08-22 DIAGNOSIS — Z905 Acquired absence of kidney: Secondary | ICD-10-CM | POA: Diagnosis not present

## 2018-08-22 LAB — BASIC METABOLIC PANEL
Anion gap: 9 (ref 5–15)
BUN: 13 mg/dL (ref 8–23)
CHLORIDE: 104 mmol/L (ref 98–111)
CO2: 20 mmol/L — AB (ref 22–32)
CREATININE: 1.18 mg/dL — AB (ref 0.44–1.00)
Calcium: 9.9 mg/dL (ref 8.9–10.3)
GFR calc Af Amer: 56 mL/min — ABNORMAL LOW (ref >60)
GFR calc non Af Amer: 48 mL/min — ABNORMAL LOW (ref >60)
Glucose, Bld: 114 mg/dL — ABNORMAL HIGH (ref 70–99)
Potassium: 4.7 mmol/L (ref 3.5–5.1)
Sodium: 133 mmol/L — ABNORMAL LOW (ref 135–145)

## 2018-08-22 LAB — CBC
HEMATOCRIT: 41 % (ref 36.0–46.0)
HEMOGLOBIN: 12.4 g/dL (ref 12.0–15.0)
MCH: 27 pg (ref 26.0–34.0)
MCHC: 30.2 g/dL (ref 30.0–36.0)
MCV: 89.1 fL (ref 80.0–100.0)
Platelets: 491 10*3/uL — ABNORMAL HIGH (ref 150–400)
RBC: 4.6 MIL/uL (ref 3.87–5.11)
RDW: 13.6 % (ref 11.5–15.5)
WBC: 6.5 10*3/uL (ref 4.0–10.5)
nRBC: 0 % (ref 0.0–0.2)

## 2018-08-22 LAB — SURGICAL PCR SCREEN
MRSA, PCR: POSITIVE — AB
Staphylococcus aureus: POSITIVE — AB

## 2018-08-22 LAB — HEMOGLOBIN A1C
Hgb A1c MFr Bld: 6.5 % — ABNORMAL HIGH (ref 4.8–5.6)
Mean Plasma Glucose: 139.85 mg/dL

## 2018-08-22 LAB — GLUCOSE, CAPILLARY: Glucose-Capillary: 111 mg/dL — ABNORMAL HIGH (ref 70–99)

## 2018-08-22 NOTE — Telephone Encounter (Signed)
Patient called and spoke with Jackelyn Poling stating she was told to be off of her diclofenac x 5 days prior to surgery. She is scheduled for tomorrow and last took the medication yesterday. Per Dr. Lorin Mercy, advise the patient to not take any more diclofenac. OK to proceed with surgery. [place] notified.

## 2018-08-23 ENCOUNTER — Observation Stay (HOSPITAL_COMMUNITY)
Admission: RE | Admit: 2018-08-23 | Discharge: 2018-08-24 | Disposition: A | Discharge status: Home Health | Payer: Managed Care, Other (non HMO) | Source: Ambulatory Visit | Attending: Orthopaedic Surgery | Admitting: Orthopaedic Surgery

## 2018-08-23 ENCOUNTER — Ambulatory Visit (HOSPITAL_COMMUNITY): Payer: Managed Care, Other (non HMO) | Admitting: Certified Registered Nurse Anesthetist

## 2018-08-23 ENCOUNTER — Encounter (HOSPITAL_COMMUNITY)
Admission: RE | Disposition: A | Discharge status: Home Health | Payer: Self-pay | Source: Ambulatory Visit | Attending: Orthopaedic Surgery

## 2018-08-23 ENCOUNTER — Ambulatory Visit (HOSPITAL_COMMUNITY): Payer: Managed Care, Other (non HMO) | Admitting: Physician Assistant

## 2018-08-23 ENCOUNTER — Encounter (HOSPITAL_COMMUNITY): Payer: Self-pay | Admitting: *Deleted

## 2018-08-23 ENCOUNTER — Ambulatory Visit (HOSPITAL_COMMUNITY): Payer: Managed Care, Other (non HMO)

## 2018-08-23 DIAGNOSIS — Z79899 Other long term (current) drug therapy: Secondary | ICD-10-CM | POA: Insufficient documentation

## 2018-08-23 DIAGNOSIS — Z87891 Personal history of nicotine dependence: Secondary | ICD-10-CM | POA: Insufficient documentation

## 2018-08-23 DIAGNOSIS — Z96653 Presence of artificial knee joint, bilateral: Secondary | ICD-10-CM | POA: Insufficient documentation

## 2018-08-23 DIAGNOSIS — Z905 Acquired absence of kidney: Secondary | ICD-10-CM | POA: Insufficient documentation

## 2018-08-23 DIAGNOSIS — M170 Bilateral primary osteoarthritis of knee: Secondary | ICD-10-CM | POA: Insufficient documentation

## 2018-08-23 DIAGNOSIS — Z9221 Personal history of antineoplastic chemotherapy: Secondary | ICD-10-CM | POA: Insufficient documentation

## 2018-08-23 DIAGNOSIS — E119 Type 2 diabetes mellitus without complications: Secondary | ICD-10-CM | POA: Insufficient documentation

## 2018-08-23 DIAGNOSIS — M48061 Spinal stenosis, lumbar region without neurogenic claudication: Secondary | ICD-10-CM | POA: Diagnosis present

## 2018-08-23 DIAGNOSIS — M48062 Spinal stenosis, lumbar region with neurogenic claudication: Secondary | ICD-10-CM

## 2018-08-23 DIAGNOSIS — M1990 Unspecified osteoarthritis, unspecified site: Secondary | ICD-10-CM | POA: Insufficient documentation

## 2018-08-23 DIAGNOSIS — K219 Gastro-esophageal reflux disease without esophagitis: Secondary | ICD-10-CM | POA: Insufficient documentation

## 2018-08-23 DIAGNOSIS — M778 Other enthesopathies, not elsewhere classified: Secondary | ICD-10-CM | POA: Insufficient documentation

## 2018-08-23 DIAGNOSIS — Z01818 Encounter for other preprocedural examination: Secondary | ICD-10-CM

## 2018-08-23 DIAGNOSIS — Z853 Personal history of malignant neoplasm of breast: Secondary | ICD-10-CM | POA: Insufficient documentation

## 2018-08-23 DIAGNOSIS — I739 Peripheral vascular disease, unspecified: Secondary | ICD-10-CM | POA: Insufficient documentation

## 2018-08-23 DIAGNOSIS — Z9049 Acquired absence of other specified parts of digestive tract: Secondary | ICD-10-CM | POA: Insufficient documentation

## 2018-08-23 DIAGNOSIS — G4730 Sleep apnea, unspecified: Secondary | ICD-10-CM | POA: Insufficient documentation

## 2018-08-23 DIAGNOSIS — Z7984 Long term (current) use of oral hypoglycemic drugs: Secondary | ICD-10-CM | POA: Insufficient documentation

## 2018-08-23 DIAGNOSIS — Z6833 Body mass index (BMI) 33.0-33.9, adult: Secondary | ICD-10-CM | POA: Insufficient documentation

## 2018-08-23 DIAGNOSIS — I872 Venous insufficiency (chronic) (peripheral): Secondary | ICD-10-CM | POA: Insufficient documentation

## 2018-08-23 DIAGNOSIS — Z923 Personal history of irradiation: Secondary | ICD-10-CM | POA: Insufficient documentation

## 2018-08-23 DIAGNOSIS — I10 Essential (primary) hypertension: Secondary | ICD-10-CM | POA: Insufficient documentation

## 2018-08-23 DIAGNOSIS — M5116 Intervertebral disc disorders with radiculopathy, lumbar region: Secondary | ICD-10-CM | POA: Insufficient documentation

## 2018-08-23 DIAGNOSIS — Z419 Encounter for procedure for purposes other than remedying health state, unspecified: Secondary | ICD-10-CM

## 2018-08-23 DIAGNOSIS — G9619 Other disorders of meninges, not elsewhere classified: Secondary | ICD-10-CM | POA: Insufficient documentation

## 2018-08-23 HISTORY — PX: LUMBAR LAMINECTOMY/DECOMPRESSION MICRODISCECTOMY: SHX5026

## 2018-08-23 LAB — HEPATIC FUNCTION PANEL
ALK PHOS: 74 U/L (ref 38–126)
ALT: 16 U/L (ref 0–44)
AST: 18 U/L (ref 15–41)
Albumin: 3.8 g/dL (ref 3.5–5.0)
Bilirubin, Direct: <0.1 mg/dL (ref 0.0–0.2)
TOTAL PROTEIN: 8.2 g/dL — AB (ref 6.5–8.1)
Total Bilirubin: 0.4 mg/dL (ref 0.3–1.2)

## 2018-08-23 LAB — GLUCOSE, CAPILLARY
Glucose-Capillary: 137 mg/dL — ABNORMAL HIGH (ref 70–99)
Glucose-Capillary: 112 mg/dL — ABNORMAL HIGH (ref 70–99)
Glucose-Capillary: 102 mg/dL — ABNORMAL HIGH (ref 70–99)

## 2018-08-23 SURGERY — LUMBAR LAMINECTOMY/DECOMPRESSION MICRODISCECTOMY
Anesthesia: General | Site: Back

## 2018-08-23 MED ORDER — ROCURONIUM BROMIDE 50 MG/5ML IV SOSY
PREFILLED_SYRINGE | INTRAVENOUS | Status: AC
  Filled 2018-08-23: qty 5

## 2018-08-23 MED ORDER — BUPIVACAINE HCL (PF) 0.25 % IJ SOLN
INTRAMUSCULAR | Status: AC
  Filled 2018-08-23: qty 30

## 2018-08-23 MED ORDER — POTASSIUM CHLORIDE CRYS ER 20 MEQ PO TBCR: 20.0000 meq | EXTENDED_RELEASE_TABLET | Freq: Every day | ORAL | Status: DC | PRN

## 2018-08-23 MED ORDER — SODIUM CHLORIDE 0.9% FLUSH: 3.0000 mL | INTRAVENOUS | Status: DC | PRN

## 2018-08-23 MED ORDER — GLYCOPYRROLATE PF 0.2 MG/ML IJ SOSY
PREFILLED_SYRINGE | INTRAMUSCULAR | Status: AC
  Filled 2018-08-23: qty 1

## 2018-08-23 MED ORDER — ONDANSETRON HCL 4 MG PO TABS: 4.0000 mg | ORAL_TABLET | Freq: Four times a day (QID) | ORAL | Status: DC | PRN

## 2018-08-23 MED ORDER — PROPOFOL 10 MG/ML IV BOLUS
INTRAVENOUS | Status: DC | PRN
  Administered 2018-08-23: 150 mg via INTRAVENOUS
  Administered 2018-08-23: 20 mg via INTRAVENOUS

## 2018-08-23 MED ORDER — HYDROMORPHONE HCL 1 MG/ML IJ SOLN
0.5000 mg | INTRAMUSCULAR | Status: DC | PRN
  Administered 2018-08-23: 0.5 mg via INTRAVENOUS
  Filled 2018-08-23: qty 1

## 2018-08-23 MED ORDER — SODIUM CHLORIDE 0.9% FLUSH
3.0000 mL | Freq: Two times a day (BID) | INTRAVENOUS | Status: DC
  Administered 2018-08-23 – 2018-08-24 (×2): 3 mL via INTRAVENOUS

## 2018-08-23 MED ORDER — OXYCODONE HCL 5 MG PO TABS
5.0000 mg | ORAL_TABLET | Freq: Once | ORAL | Status: AC | PRN
  Administered 2018-08-23: 5 mg via ORAL

## 2018-08-23 MED ORDER — HYDROMORPHONE HCL 1 MG/ML IJ SOLN
0.2500 mg | INTRAMUSCULAR | Status: DC | PRN
  Administered 2018-08-23 (×2): 0.5 mg via INTRAVENOUS

## 2018-08-23 MED ORDER — HYDROMORPHONE HCL 1 MG/ML IJ SOLN
INTRAMUSCULAR | Status: AC
  Administered 2018-08-23: 0.5 mg via INTRAVENOUS
  Filled 2018-08-23: qty 1

## 2018-08-23 MED ORDER — METHOCARBAMOL 500 MG PO TABS
500.0000 mg | ORAL_TABLET | Freq: Four times a day (QID) | ORAL | Status: DC | PRN
  Administered 2018-08-23 – 2018-08-24 (×3): 500 mg via ORAL
  Filled 2018-08-23 (×2): qty 1

## 2018-08-23 MED ORDER — METHOCARBAMOL 1000 MG/10ML IJ SOLN
500.0000 mg | Freq: Four times a day (QID) | INTRAVENOUS | Status: DC | PRN
  Filled 2018-08-23: qty 5

## 2018-08-23 MED ORDER — SODIUM CHLORIDE 0.9 % IV SOLN
INTRAVENOUS | Status: DC
  Administered 2018-08-23: 17:00:00 via INTRAVENOUS

## 2018-08-23 MED ORDER — CHLORHEXIDINE GLUCONATE 4 % EX LIQD: 60.0000 mL | Freq: Once | CUTANEOUS | Status: DC

## 2018-08-23 MED ORDER — AMLODIPINE BESYLATE 10 MG PO TABS
10.0000 mg | ORAL_TABLET | Freq: Every day | ORAL | Status: DC
  Administered 2018-08-24: 10 mg via ORAL
  Filled 2018-08-23: qty 1

## 2018-08-23 MED ORDER — SODIUM CHLORIDE 0.9 % IV SOLN: 250.0000 mL | INTRAVENOUS | Status: DC

## 2018-08-23 MED ORDER — FUROSEMIDE 20 MG PO TABS: 20.0000 mg | ORAL_TABLET | Freq: Every day | ORAL | Status: DC | PRN

## 2018-08-23 MED ORDER — OXYCODONE HCL 5 MG/5ML PO SOLN: 5.0000 mg | Freq: Once | ORAL | Status: AC | PRN

## 2018-08-23 MED ORDER — LIDOCAINE 2% (20 MG/ML) 5 ML SYRINGE
INTRAMUSCULAR | Status: AC
  Filled 2018-08-23: qty 5

## 2018-08-23 MED ORDER — CHLORHEXIDINE GLUCONATE CLOTH 2 % EX PADS
6.0000 | MEDICATED_PAD | Freq: Every day | CUTANEOUS | Status: DC
  Administered 2018-08-24: 6 via TOPICAL

## 2018-08-23 MED ORDER — SUGAMMADEX SODIUM 200 MG/2ML IV SOLN
INTRAVENOUS | Status: AC
  Filled 2018-08-23: qty 2

## 2018-08-23 MED ORDER — SUGAMMADEX SODIUM 200 MG/2ML IV SOLN
INTRAVENOUS | Status: DC | PRN
  Administered 2018-08-23: 187.4 mg via INTRAVENOUS

## 2018-08-23 MED ORDER — PHENYLEPHRINE 40 MCG/ML (10ML) SYRINGE FOR IV PUSH (FOR BLOOD PRESSURE SUPPORT)
PREFILLED_SYRINGE | INTRAVENOUS | Status: DC | PRN
  Administered 2018-08-23: 80 ug via INTRAVENOUS
  Administered 2018-08-23: 120 ug via INTRAVENOUS

## 2018-08-23 MED ORDER — GABAPENTIN 100 MG PO CAPS: 100.0000 mg | ORAL_CAPSULE | Freq: Two times a day (BID) | ORAL | Status: DC | PRN

## 2018-08-23 MED ORDER — THROMBIN (RECOMBINANT) 5000 UNITS EX SOLR
CUTANEOUS | Status: AC
  Filled 2018-08-23: qty 5000

## 2018-08-23 MED ORDER — LACTATED RINGERS IV SOLN
INTRAVENOUS | Status: DC
  Administered 2018-08-23: 06:00:00 via INTRAVENOUS

## 2018-08-23 MED ORDER — PROMETHAZINE HCL 25 MG/ML IJ SOLN: 6.2500 mg | INTRAMUSCULAR | Status: DC | PRN

## 2018-08-23 MED ORDER — METHOCARBAMOL 500 MG PO TABS: 500.0000 mg | ORAL_TABLET | Freq: Four times a day (QID) | ORAL | 0 refills | Status: DC | PRN

## 2018-08-23 MED ORDER — ACETAMINOPHEN 325 MG PO TABS
650.0000 mg | ORAL_TABLET | ORAL | Status: DC | PRN
  Administered 2018-08-24 (×2): 650 mg via ORAL
  Filled 2018-08-23 (×2): qty 2

## 2018-08-23 MED ORDER — DOCUSATE SODIUM 100 MG PO CAPS
100.0000 mg | ORAL_CAPSULE | Freq: Two times a day (BID) | ORAL | Status: DC
  Administered 2018-08-23 – 2018-08-24 (×2): 100 mg via ORAL
  Filled 2018-08-23 (×2): qty 1

## 2018-08-23 MED ORDER — CEFAZOLIN SODIUM-DEXTROSE 2-4 GM/100ML-% IV SOLN
2.0000 g | Freq: Three times a day (TID) | INTRAVENOUS | Status: AC
  Administered 2018-08-23 (×2): 2 g via INTRAVENOUS
  Filled 2018-08-23 (×2): qty 100

## 2018-08-23 MED ORDER — ONDANSETRON HCL 4 MG/2ML IJ SOLN: 4.0000 mg | Freq: Four times a day (QID) | INTRAMUSCULAR | Status: DC | PRN

## 2018-08-23 MED ORDER — FENTANYL CITRATE (PF) 100 MCG/2ML IJ SOLN
INTRAMUSCULAR | Status: DC | PRN
  Administered 2018-08-23 (×3): 50 ug via INTRAVENOUS
  Administered 2018-08-23: 100 ug via INTRAVENOUS
  Administered 2018-08-23 (×2): 50 ug via INTRAVENOUS

## 2018-08-23 MED ORDER — FENTANYL CITRATE (PF) 250 MCG/5ML IJ SOLN
INTRAMUSCULAR | Status: AC
  Filled 2018-08-23: qty 5

## 2018-08-23 MED ORDER — PHENYLEPHRINE HCL 10 MG/ML IJ SOLN
INTRAMUSCULAR | Status: AC
  Filled 2018-08-23: qty 1

## 2018-08-23 MED ORDER — MIDAZOLAM HCL 5 MG/5ML IJ SOLN
INTRAMUSCULAR | Status: DC | PRN
  Administered 2018-08-23: 2 mg via INTRAVENOUS

## 2018-08-23 MED ORDER — ONDANSETRON HCL 4 MG/2ML IJ SOLN
INTRAMUSCULAR | Status: AC
  Filled 2018-08-23: qty 2

## 2018-08-23 MED ORDER — PROPOFOL 10 MG/ML IV BOLUS
INTRAVENOUS | Status: AC
  Filled 2018-08-23: qty 20

## 2018-08-23 MED ORDER — FLUTICASONE PROPIONATE 50 MCG/ACT NA SUSP: 2.0000 | Freq: Every day | NASAL | Status: DC | PRN

## 2018-08-23 MED ORDER — METFORMIN HCL 500 MG PO TABS
500.0000 mg | ORAL_TABLET | Freq: Two times a day (BID) | ORAL | Status: DC
  Administered 2018-08-23 – 2018-08-24 (×2): 500 mg via ORAL
  Filled 2018-08-23 (×2): qty 1

## 2018-08-23 MED ORDER — PHENOL 1.4 % MT LIQD: 1.0000 | OROMUCOSAL | Status: DC | PRN

## 2018-08-23 MED ORDER — PHENYLEPHRINE 40 MCG/ML (10ML) SYRINGE FOR IV PUSH (FOR BLOOD PRESSURE SUPPORT)
PREFILLED_SYRINGE | INTRAVENOUS | Status: AC
  Filled 2018-08-23: qty 10

## 2018-08-23 MED ORDER — METHOCARBAMOL 500 MG PO TABS
ORAL_TABLET | ORAL | Status: AC
  Administered 2018-08-23: 500 mg via ORAL
  Filled 2018-08-23: qty 1

## 2018-08-23 MED ORDER — MENTHOL 3 MG MT LOZG: 1.0000 | LOZENGE | OROMUCOSAL | Status: DC | PRN

## 2018-08-23 MED ORDER — OXYCODONE-ACETAMINOPHEN 5-325 MG PO TABS: 1.0000 | ORAL_TABLET | Freq: Four times a day (QID) | ORAL | 0 refills | Status: DC | PRN

## 2018-08-23 MED ORDER — THROMBIN (RECOMBINANT) 20000 UNITS EX SOLR
CUTANEOUS | Status: AC
  Filled 2018-08-23: qty 20000

## 2018-08-23 MED ORDER — ONDANSETRON HCL 4 MG/2ML IJ SOLN
INTRAMUSCULAR | Status: DC | PRN
  Administered 2018-08-23: 4 mg via INTRAVENOUS

## 2018-08-23 MED ORDER — MIDAZOLAM HCL 2 MG/2ML IJ SOLN
INTRAMUSCULAR | Status: AC
  Filled 2018-08-23: qty 2

## 2018-08-23 MED ORDER — OXYCODONE HCL 5 MG PO TABS
ORAL_TABLET | ORAL | Status: AC
  Administered 2018-08-23: 5 mg via ORAL
  Filled 2018-08-23: qty 1

## 2018-08-23 MED ORDER — MUPIROCIN 2 % EX OINT
1.0000 "application " | TOPICAL_OINTMENT | Freq: Two times a day (BID) | CUTANEOUS | Status: DC
  Administered 2018-08-23 – 2018-08-24 (×2): 1 via NASAL
  Filled 2018-08-23: qty 22

## 2018-08-23 MED ORDER — OXYCODONE HCL 5 MG PO TABS
5.0000 mg | ORAL_TABLET | Freq: Four times a day (QID) | ORAL | Status: DC | PRN
  Administered 2018-08-23 – 2018-08-24 (×3): 5 mg via ORAL
  Filled 2018-08-23 (×3): qty 1

## 2018-08-23 MED ORDER — ACETAMINOPHEN 650 MG RE SUPP: 650.0000 mg | RECTAL | Status: DC | PRN

## 2018-08-23 MED ORDER — SODIUM CHLORIDE 0.9 % IV SOLN
INTRAVENOUS | Status: DC | PRN
  Administered 2018-08-23: 20 ug/min via INTRAVENOUS

## 2018-08-23 MED ORDER — DOXYCYCLINE HYCLATE 100 MG PO CAPS: 100.0000 mg | ORAL_CAPSULE | Freq: Two times a day (BID) | ORAL | 0 refills | Status: DC

## 2018-08-23 MED ORDER — GLYCOPYRROLATE PF 0.2 MG/ML IJ SOSY
PREFILLED_SYRINGE | INTRAMUSCULAR | Status: DC | PRN
  Administered 2018-08-23: .2 mg via INTRAVENOUS

## 2018-08-23 MED ORDER — ROCURONIUM BROMIDE 50 MG/5ML IV SOSY
PREFILLED_SYRINGE | INTRAVENOUS | Status: DC | PRN
  Administered 2018-08-23: 50 mg via INTRAVENOUS
  Administered 2018-08-23: 20 mg via INTRAVENOUS

## 2018-08-23 MED ORDER — BUPIVACAINE HCL (PF) 0.25 % IJ SOLN
INTRAMUSCULAR | Status: DC | PRN
  Administered 2018-08-23: 10 mL

## 2018-08-23 MED ORDER — VANCOMYCIN HCL IN DEXTROSE 1-5 GM/200ML-% IV SOLN
1000.0000 mg | Freq: Once | INTRAVENOUS | Status: AC
  Administered 2018-08-23: 1000 mg via INTRAVENOUS
  Filled 2018-08-23 (×2): qty 200

## 2018-08-23 MED ORDER — LIDOCAINE 2% (20 MG/ML) 5 ML SYRINGE
INTRAMUSCULAR | Status: DC | PRN
  Administered 2018-08-23: 60 mg via INTRAVENOUS

## 2018-08-23 MED ORDER — SPIRONOLACTONE 25 MG PO TABS
25.0000 mg | ORAL_TABLET | Freq: Every day | ORAL | Status: DC
  Administered 2018-08-24: 25 mg via ORAL
  Filled 2018-08-23: qty 1

## 2018-08-23 SURGICAL SUPPLY — 50 items
AGENT HMST KT MTR STRL THRMB (HEMOSTASIS) ×1
APL SKNCLS STERI-STRIP NONHPOA (GAUZE/BANDAGES/DRESSINGS) ×1
BENZOIN TINCTURE PRP APPL 2/3 (GAUZE/BANDAGES/DRESSINGS) ×1 IMPLANT
BUR ROUND FLUTED 4 SOFT TCH (BURR) ×1 IMPLANT
CANISTER SUCT 3000ML PPV (MISCELLANEOUS) ×2 IMPLANT
CLSR STERI-STRIP ANTIMIC 1/2X4 (GAUZE/BANDAGES/DRESSINGS) ×2 IMPLANT
COVER SURGICAL LIGHT HANDLE (MISCELLANEOUS) ×2 IMPLANT
COVER WAND RF STERILE (DRAPES) ×2 IMPLANT
DECANTER SPIKE VIAL GLASS SM (MISCELLANEOUS) ×2 IMPLANT
DRAPE HALF SHEET 40X57 (DRAPES) ×4 IMPLANT
DRAPE MICROSCOPE LEICA (MISCELLANEOUS) ×2 IMPLANT
DRAPE SURG 17X23 STRL (DRAPES) ×2 IMPLANT
DRSG MEPILEX BORDER 4X4 (GAUZE/BANDAGES/DRESSINGS) ×2 IMPLANT
DRSG MEPITEL 3X4 ME34 (GAUZE/BANDAGES/DRESSINGS) ×1 IMPLANT
DURAPREP 26ML APPLICATOR (WOUND CARE) ×2 IMPLANT
ELECT BLADE 4.0 EZ CLEAN MEGAD (MISCELLANEOUS) ×2
ELECT REM PT RETURN 9FT ADLT (ELECTROSURGICAL) ×2
ELECTRODE BLDE 4.0 EZ CLN MEGD (MISCELLANEOUS) IMPLANT
ELECTRODE REM PT RTRN 9FT ADLT (ELECTROSURGICAL) ×1 IMPLANT
GLOVE BIOGEL PI IND STRL 8 (GLOVE) ×2 IMPLANT
GLOVE BIOGEL PI INDICATOR 8 (GLOVE) ×2
GLOVE ORTHO TXT STRL SZ7.5 (GLOVE) ×4 IMPLANT
GOWN STRL REUS W/ TWL LRG LVL3 (GOWN DISPOSABLE) ×2 IMPLANT
GOWN STRL REUS W/ TWL XL LVL3 (GOWN DISPOSABLE) ×1 IMPLANT
GOWN STRL REUS W/TWL 2XL LVL3 (GOWN DISPOSABLE) ×2 IMPLANT
GOWN STRL REUS W/TWL LRG LVL3 (GOWN DISPOSABLE) ×4
GOWN STRL REUS W/TWL XL LVL3 (GOWN DISPOSABLE) ×2
KIT BASIN OR (CUSTOM PROCEDURE TRAY) ×2 IMPLANT
KIT TURNOVER KIT B (KITS) ×2 IMPLANT
MANIFOLD NEPTUNE II (INSTRUMENTS) ×2 IMPLANT
NDL HYPO 25GX1X1/2 BEV (NEEDLE) ×1 IMPLANT
NDL SPNL 18GX3.5 QUINCKE PK (NEEDLE) ×1 IMPLANT
NEEDLE HYPO 25GX1X1/2 BEV (NEEDLE) ×2 IMPLANT
NEEDLE SPNL 18GX3.5 QUINCKE PK (NEEDLE) ×2 IMPLANT
NS IRRIG 1000ML POUR BTL (IV SOLUTION) ×2 IMPLANT
PACK LAMINECTOMY ORTHO (CUSTOM PROCEDURE TRAY) ×2 IMPLANT
PAD ARMBOARD 7.5X6 YLW CONV (MISCELLANEOUS) ×4 IMPLANT
PATTIES SURGICAL .5 X.5 (GAUZE/BANDAGES/DRESSINGS) ×1 IMPLANT
PATTIES SURGICAL .75X.75 (GAUZE/BANDAGES/DRESSINGS) ×1 IMPLANT
STRIP CLOSURE SKIN 1/2X4 (GAUZE/BANDAGES/DRESSINGS) ×1 IMPLANT
SURGIFLO W/THROMBIN 8M KIT (HEMOSTASIS) ×1 IMPLANT
SUT VIC AB 0 CT1 27 (SUTURE) ×2
SUT VIC AB 0 CT1 27XBRD ANBCTR (SUTURE) IMPLANT
SUT VIC AB 1 CTX 36 (SUTURE) ×2
SUT VIC AB 1 CTX36XBRD ANBCTR (SUTURE) ×1 IMPLANT
SUT VIC AB 2-0 CT1 27 (SUTURE) ×2
SUT VIC AB 2-0 CT1 TAPERPNT 27 (SUTURE) ×1 IMPLANT
SUT VIC AB 3-0 X1 27 (SUTURE) ×2 IMPLANT
TOWEL OR 17X24 6PK STRL BLUE (TOWEL DISPOSABLE) ×2 IMPLANT
TOWEL OR 17X26 10 PK STRL BLUE (TOWEL DISPOSABLE) ×2 IMPLANT

## 2018-08-23 NOTE — Interval H&P Note (Signed)
History and Physical Interval Note:  08/23/2018 7:25 AM  [name]  has presented today for surgery, with the diagnosis of L3-4 stenosis, facet cyst  The various methods of treatment have been discussed with the patient and family. After consideration of risks, benefits and other options for treatment, the patient has consented to  Procedure(s): L3-4 DECOMPRESSION, REMOVAL OF INTRASPINAL EXTRADURAL FACET CYST (N/A) as a surgical intervention .  The patient's history has been reviewed, patient examined, no change in status, stable for surgery.  I have reviewed the patient's chart and labs.  Questions were answered to the patient's satisfaction.     Marybelle Killings

## 2018-08-23 NOTE — Anesthesia Procedure Notes (Signed)
Procedure Name: Intubation Date/Time: 08/23/2018 7:44 AM Performed by: Alain Marion, CRNA Pre-anesthesia Checklist: Patient identified, Emergency Drugs available, Suction available and Patient being monitored Patient Re-evaluated:Patient Re-evaluated prior to induction Oxygen Delivery Method: Circle System Utilized Preoxygenation: Pre-oxygenation with 100% oxygen Induction Type: IV induction Ventilation: Mask ventilation without difficulty Laryngoscope Size: Miller and 2 Grade View: Grade I Tube type: Oral Number of attempts: 1 Airway Equipment and Method: Stylet and Oral airway Placement Confirmation: ETT inserted through vocal cords under direct vision,  positive ETCO2 and breath sounds checked- equal and bilateral Secured at: 22 cm Tube secured with: Tape Dental Injury: Teeth and Oropharynx as per pre-operative assessment

## 2018-08-23 NOTE — Discharge Instructions (Addendum)
Ok to shower 1 days postop.  Do not apply any creams or ointments to incision.  Do not remove steri-strips.  Can use 4x4 gauze and tape for dressing changes.  No aggressive activity.  No bending, squatting or prolonged sitting.  Mostly be in reclined position or lying down.    No driving

## 2018-08-23 NOTE — Transfer of Care (Addendum)
Immediate Anesthesia Transfer of Care Note  Patient: [name]  Procedure(s) Performed: L3-4 DECOMPRESSION, REMOVAL OF INTRASPINAL EXTRADURAL FACET CYST (N/A Back)  Patient Location: PACU  Anesthesia Type:General  Level of Consciousness: awake, alert  and oriented  Airway & Oxygen Therapy: Patient Spontanous Breathing and Patient connected to nasal cannula oxygen  Post-op Assessment: Report given to RN and Post -op Vital signs reviewed and stable  Post vital signs: Reviewed and stable  Last Vitals:  Vitals Value Taken Time  BP 110/82 08/23/2018  9:46 AM  Temp    Pulse 64 08/23/2018  9:47 AM  Resp 11 08/23/2018  9:47 AM  SpO2 100 % 08/23/2018  9:47 AM  Vitals shown include unvalidated device data.  Last Pain:  Vitals:   08/23/18 0619  TempSrc:   PainSc: 0-No pain         Complications: No apparent anesthesia complications

## 2018-08-23 NOTE — Anesthesia Postprocedure Evaluation (Signed)
Anesthesia Post Note  Patient: [name]  Procedure(s) Performed: L3-4 DECOMPRESSION, REMOVAL OF INTRASPINAL EXTRADURAL FACET CYST (N/A Back)     Patient location during evaluation: PACU Anesthesia Type: General Level of consciousness: awake and alert Pain management: pain level controlled Vital Signs Assessment: post-procedure vital signs reviewed and stable Respiratory status: spontaneous breathing, nonlabored ventilation and respiratory function stable Cardiovascular status: blood pressure returned to baseline and stable Postop Assessment: no apparent nausea or vomiting Anesthetic complications: no    Last Vitals:  Vitals:   08/23/18 1045 08/23/18 1100  BP: 99/70 108/82  Pulse: (!) 58 (!) 58  Resp: 12 14  Temp:    SpO2: 100% 100%    Last Pain:  Vitals:   08/23/18 1100  TempSrc:   PainSc: [place]

## 2018-08-23 NOTE — H&P (Signed)
Office Visit Note              Patient: [name]                                          Date of Birth: Feb 15, 1956                                                    MRN: [id number] Visit Date: 08/20/2018                                                                     Requested by: Hoyt Koch, MD [place], [place] [id number] PCP: Hoyt Koch, MD   Assessment & Plan: Visit Diagnoses:  1. Spinal stenosis of lumbar region with neurogenic claudication           And right radiculopathy with intraspinal extradural facet cyst.  Plan: Patient has symptomatic spinal stenosis with nerve root compression secondary to intraspinal extradural facet cyst on the right at the L3-4 level.  This is causing lateral recess stenosis without foraminal stenosis.  Plan to be single level decompression excision of the intraspinal extradural facet cyst at the L3-4 level with use of the operative microscope.  (CPT [id number] and [phone].)  We discussed operative technique overnight stay, several weeks out of work after the procedure with a walking program avoiding lifting turning and twisting for several weeks.  Risk of dural tear, cyst recurrence, progressive instability with need for fusion was discussed in detail.  Questions were elicited and answered she understands and requests we proceed.  MRI scans were reviewed with patient I gave her a copy of the report.  Decision for surgery was made today.  Follow-Up Instructions: No follow-ups on file.   Orders:  No orders of the defined types were placed in this encounter.  No orders of the defined types were placed in this encounter.     Procedures: No procedures performed   Clinical Data: No additional findings.   Subjective:    Chief Complaint  Patient presents with  . Lower Back - Pain    HPI patient is seen by me at the request of Dr. Eunice Blase for consideration of surgical intervention  for a large facet cyst versus disc fragment causing moderate spinal stenosis with radiculopathy right leg.  She is had some pain off and on radiating into her right hip for many months and the last 3 weeks has become progressively severe with turning and twisting she feels leg weakness and is concerned her leg will give way.  Denies night pain.  She is had previous steroid injection without relief.  She has diabetes which is well controlled.  No bladder or bowel symptoms.  History of breast cancer 2010.  She been on oxycodone at night taking gabapentin without relief.  MRI scan 07/21/2018 showed large 9 x 6 x 11 mm intraspinal extradural mass most consistent with facet cyst versus sequestered disc fragment causing  mass-effect on the right side of the dural sac and lateral recess stenosis with compression of the nerve root.  No neuroforaminal stenosis.  This is a new finding from comparison MRI 02/20/2011. Patient works in [employer].  Patient has increased pain with standing difficulty walking long distance with increased pain.  With rotation she feels sharp pain that radiates from the right buttocks into the right thigh region with increased numbness and quad weakness. Trochanteric injection 07/15/2018 did not give her relief.  Request for epidural was denied x2.  Review of Systems positive for bilateral total knee arthroplasties 2018.  Positive for GERD, sleep apnea, type 2 diabetes uncomplicated.  Venous insufficiency, past history of morbid obesity with weight loss now BMI 32.  Breast cancer 2011 with chemo and radiation.  Previous bladder suspension, appendectomy 1978, nephrectomy.  Negative cardiovascular respiratory endocrine.  14 point review of systems otherwise negative.   Objective: Vital Signs: BP 124/89   Pulse 68   Ht 5\' 6"  (1.676 m)   Wt 199 lb (90.3 kg)   BMI 32.12 kg/m   Physical Exam  Constitutional: She is oriented to person, place, and time. She appears  well-developed.  HENT:  Head: Normocephalic.  Right Ear: External ear normal.  Left Ear: External ear normal.  Eyes: Pupils are equal, round, and reactive to light.  Neck: No tracheal deviation present. No thyromegaly present.  Cardiovascular: Normal rate.  Pulmonary/Chest: Effort normal.  Abdominal: Soft.  Neurological: She is alert and oriented to person, place, and time.  Skin: Skin is warm and dry.  Psychiatric: She has a normal mood and affect. Her behavior is normal.    Ortho Exam pelvis is level patient has negative logroll to the hips.  Well-healed right and left total knee arthroplasty incisions.  Tenderness of the right greater trochanter.  Negative straight leg raising.  Decreased sensation over right thigh.  Normal on the left.  Specialty Comments:  No specialty comments available.  Imaging: CLINICAL DATA: Low back pain and bilateral leg pain.  EXAM: MRI LUMBAR SPINE WITHOUT CONTRAST  TECHNIQUE: Multiplanar, multisequence MR imaging of the lumbar spine was performed. No intravenous contrast was administered.  COMPARISON: Lumbar spine radiograph 07/10/2018  Lumbar spine MRI 02/20/2011  FINDINGS: Segmentation: Normal. The lowest disc space is considered to be L5-S1.  Alignment: Normal  Vertebrae: No acute compression fracture, discitis-osteomyelitis of focal marrow lesion.  Conus medullaris and cauda equina: The conus medullaris terminates at the L1 level. The cauda equina and conus medullaris are both normal.  Paraspinal and other soft tissues: The visualized aorta, IVC and iliac vessels are normal. The visualized retroperitoneal organs and paraspinal soft tissues are normal.  Disc levels: Sagittal plane imaging includes the T10-11 disc level through the upper sacrum, with axial imaging of the T11-12 to L5-S1 disc levels.  T10-L3 levels are normal.  L3-L4: Small disc bulge. Moderate facet hypertrophy. There is a medially  projecting, low T1/intermediate T2-weighted signal focus adjacent to the medial aspect of the right L3-4 facet joint. This measures 9 x 6 x 11 mm. This causes moderate spinal canal stenosis with crowding of the cauda equina nerve roots. Mass effect is greatest on the descending right-sided nerve roots in the posterior right lateral recess. There is no neural foraminal stenosis.  L4-L5: Small central disc protrusion with annular fissure, unchanged. No spinal canal stenosis. No neural foraminal stenosis.  L5-S1: Moderate facet hypertrophy and small central disc protrusion with annular fissure, unchanged. There is mild narrowing of the  right lateral recess. No spinal canal stenosis. No foraminal stenosis.  The visualized portion of the sacrum is normal.  IMPRESSION: 1. New medially projecting lesion adjacent to the right L3-4 facet joint, most consistent with synovial cyst (favored) or sequestered disc fragment. This causes moderate spinal canal stenosis and exerts mass effect on the descending right-sided nerve roots in the posterior right lateral recess. 2. Unchanged small central disc protrusions at L4-5 and L5-S1. Mild L5-S1 right lateral recess narrowing, unchanged. Correlate for right S1 radiculopathy.   Electronically Signed By: Ulyses Jarred M.D. On: 07/22/2018 04:59

## 2018-08-23 NOTE — Anesthesia Preprocedure Evaluation (Signed)
Anesthesia Evaluation  Patient identified by MRN, date of birth, ID band Patient awake    Reviewed: Allergy & Precautions, H&P , NPO status , Patient's Chart, lab work & pertinent test results  Airway Mallampati: II  TM Distance: >3 FB Neck ROM: full    Dental no notable dental hx.    Pulmonary sleep apnea , former smoker,    Pulmonary exam normal breath sounds clear to auscultation       Cardiovascular hypertension, Pt. on medications + Peripheral Vascular Disease  Normal cardiovascular exam Rhythm:regular Rate:Normal     Neuro/Psych    GI/Hepatic GERD  ,  Endo/Other  diabetes, Type 2, Oral Hypoglycemic Agentsobese  Renal/GU      Musculoskeletal  (+) Arthritis , Osteoarthritis,    Abdominal   Peds  Hematology   Anesthesia Other Findings   Reproductive/Obstetrics                             Anesthesia Physical  Anesthesia Plan  ASA: III  Anesthesia Plan: General   Post-op Pain Management:    Induction: Intravenous  PONV Risk Score and Plan: 3 and Ondansetron, Dexamethasone and Midazolam  Airway Management Planned: Oral ETT  Additional Equipment:   Intra-op Plan:   Post-operative Plan: Extubation in OR  Informed Consent: I have reviewed the patients History and Physical, chart, labs and discussed the procedure including the risks, benefits and alternatives for the proposed anesthesia with the patient or authorized representative who has indicated his/her understanding and acceptance.     Plan Discussed with: CRNA, Anesthesiologist and Surgeon  Anesthesia Plan Comments:         Anesthesia Quick Evaluation

## 2018-08-23 NOTE — Op Note (Signed)
Preop diagnosis: Lumbar spinal stenosis with L3-4 intraspinal extradural facet cyst.  Postop diagnosis: Same  Procedure: L3-4 decompression.  Removal of intraspinal extradural facet cyst.  Surgeon Rodell Perna, MD  Assistant: Benjiman Core, PA-C medically necessary and present for the critical portion removing the facet cyst using the operative microscope  Anesthesia: General plus Marcaine skin local.  EBL: See anesthetic record.  Procedure: After induction general esthesia patient placed prone on chest rolls back was prepped with DuraPrep preoperative vancomycin was given due to the patient's positive PCR for MRSA.  DuraPrep was used calf pumpers yellow pads underneath the ulnar nerve and shoulders.  Area squared with towels Betadine Steri-Drape applied after marking the skin laminectomy sheets drapes.  Spinal needle was placed 2 x-rays were taken adjusting the needle for localization skin was marked and midline incision was made subperiosteal dissection out over the lamina and then another  x-ray was taken with a Kocher clamp placed in the spinous process.  I found with marked central decompression was performed removing the spinous process thinning the lamina with a Leksell using 4 mm bur to thin the lamina and operative microscope was draped brought in posterior aspect of the lamina was removed for decompression.  There are overhanging spurs trimmed back thick hypertrophic ligamentum and decompression was done on the left side first where the cyst was on the right side causing compression of the dura.  Once chunks of ligament were removed and overhanging spur was removed out to lower the pedicle the cyst was well visualized was dark bluish in color adherent to the dura microdissection was used to separate the cyst from the dura.  Once we got around the inferior aspect a tonsil clamp was placed on the cyst and the cyst was removed for permanent pathology exam.  Remaining chunks of ligament and gutter  were was removed.  Disc was palpated was flat no anterior compression of the dura.  Some Surgi-Flo was placed in the gutters bipolar cautery was available for epidural bleeding but there was minimal epidural bleeding.  Copious irrigation closure of the deep fascia with #1 Vicryl 2-0 Vicryl subtenons tissue subcuticular closure Dermabond in the skin postop dressing and transferred recovery room.  Instrument count needle count was correct.

## 2018-08-23 NOTE — Progress Notes (Signed)
Pt. Reports she has an area on right lower lateral leg that came up 2 weeks ago. Scabbed over and 1/2 inch in diameter.

## 2018-08-24 ENCOUNTER — Other Ambulatory Visit: Payer: Self-pay

## 2018-08-24 ENCOUNTER — Encounter (HOSPITAL_COMMUNITY): Payer: Self-pay | Admitting: Orthopaedic Surgery

## 2018-08-24 DIAGNOSIS — M48062 Spinal stenosis, lumbar region with neurogenic claudication: Secondary | ICD-10-CM | POA: Diagnosis not present

## 2018-08-24 NOTE — Progress Notes (Signed)
Pt assisted to BR. No assistive devices needed. Pt voided in commode using a raised toilet seat. Then she was assisted to the recliner. Pt asked for a blanket. I covered pt up with a blanket. Then placed call bell on pts lap. Let her know to call if she needed anything.

## 2018-08-24 NOTE — Progress Notes (Signed)
Heard pt calling out. I walked into the room, she said "you didn't even leave me with my call light. I need to get out of this chair." I assisted pt to put down the foot stool on the recliner. She then said "don't touch me, I can do it". Pt was able to stand, walk to the bed, and get in the bed without assistance. Medicated for pain. Pt noted to be crying. I attempted to apologize to pt, but she wouldn't speak to me.   Pt has orders to discharge home.

## 2018-08-24 NOTE — Progress Notes (Signed)
Received call from desk that pt had used her call button to request pain medication.

## 2018-08-24 NOTE — Progress Notes (Signed)
Pts husband has arrived to take pt home. Pt being assisted with dressing by husband.

## 2018-08-24 NOTE — Progress Notes (Signed)
Pt alert, able to make needs known. Pt stable. Discharge home with husband. Verbalized understanding to discharge teaching. Pt escorted to private vehicle by staff.

## 2018-08-24 NOTE — Progress Notes (Signed)
   Subjective: 1 Day Post-Op Procedure(s) (LRB): L3-4 DECOMPRESSION, REMOVAL OF INTRASPINAL EXTRADURAL FACET CYST (N/A) Patient reports pain as moderate.    Objective: Vital signs in last 24 hours: Temp:  [97 F (36.1 C)-99.6 F (37.6 C)] 99 F (37.2 C) (11/23 1008) Pulse Rate:  [56-79] 74 (11/23 1008) Resp:  [11-18] 18 (11/23 1008) BP: (92-117)/(60-82) 115/75 (11/23 1008) SpO2:  [93 %-100 %] 94 % (11/23 1008) Weight:  [93.7 kg] 93.7 kg (11/23 0704)  Intake/Output from previous day: 11/22 0701 - 11/23 0700 In: 1040 [P.O.:240; I.V.:800] Out: 50 [Blood:50] Intake/Output this shift: Total I/O In: 240 [P.O.:240] Out: -   Recent Labs    08/22/18 0918  HGB 12.4   Recent Labs    08/22/18 0918  WBC 6.5  RBC 4.60  HCT 41.0  PLT 491*   Recent Labs    08/22/18 0918  NA 133*  K 4.7  CL 104  CO2 20*  BUN 13  CREATININE 1.18*  GLUCOSE 114*  CALCIUM 9.9   No results for input(s): LABPT, INR in the last 72 hours.  Neurologically intact No results found.  Assessment/Plan: 1 Day Post-Op Procedure(s) (LRB): L3-4 DECOMPRESSION, REMOVAL OF INTRASPINAL EXTRADURAL FACET CYST (N/A) Plan:    Ambulate pt and then discharge home. Dressing changed by me.   [name] 08/24/2018, 10:25 AM

## 2018-08-24 NOTE — Plan of Care (Signed)

## 2018-08-26 ENCOUNTER — Telehealth: Payer: Self-pay | Admitting: *Deleted

## 2018-08-26 NOTE — Telephone Encounter (Signed)
Pt was on TCM report admitted 08/23/18 for spinal stenosis of lumbar region w/neurogen claudication, and right radiculopathy w/intraspinal extradural facet cyst. Pt underwent a L3-4 DECOMPRESSION, REMOVAL OF INTRASPINAL EXTRADURAL FACET CYST, and D/C 08/24/18 will follow-up with specialist Dr. Rodell Perna...Johny Chess

## 2018-08-27 NOTE — Discharge Summary (Signed)
Patient ID: APPHIA CROPLEY MRN: [id number] DOB/AGE: 01-28-1956 62 y.o.  Admit date: 08/23/2018 Discharge date: 08/27/2018  Admission Diagnoses:  Active Problems:   Spinal stenosis of lumbar region   Discharge Diagnoses:  Active Problems:   Spinal stenosis of lumbar region  status post Procedure(s): L3-4 DECOMPRESSION, REMOVAL OF INTRASPINAL EXTRADURAL FACET CYST  Past Medical History:  Diagnosis Date  . Breast cancer [facility]) 2011   right breast with axillary lymph node removal  . Diabetes mellitus, type II ([place])    type 2  . GERD (gastroesophageal reflux disease)   . Hypertension   . Osteoarthritis of both knees   . Personal history of chemotherapy   . Personal history of radiation therapy   . Pneumonia    in early 1980's    Surgeries: Procedure(s): L3-4 DECOMPRESSION, REMOVAL OF INTRASPINAL EXTRADURAL FACET CYST on 08/23/2018   Consultants:   Discharged Condition: Improved  Hospital Course: MIKIALA FUGETT is an 62 y.o. female who was admitted 08/23/2018 for operative treatment of lumbar stenosis, facet cyst. Patient failed conservative treatments (please see the history and physical for the specifics) and had severe unremitting pain that affects sleep, daily activities and work/hobbies. After pre-op clearance, the patient was taken to the operating room on 08/23/2018 and underwent  Procedure(s): L3-4 DECOMPRESSION, REMOVAL OF INTRASPINAL EXTRADURAL FACET CYST.    Patient was given perioperative antibiotics:  Anti-infectives (From admission, onward)   Start     Dose/Rate Route Frequency Ordered Stop   08/23/18 1400  ceFAZolin (ANCEF) IVPB 2g/100 mL premix     2 g 200 mL/hr over 30 Minutes Intravenous Every 8 hours 08/23/18 1322 08/24/18 0701   08/23/18 0545  vancomycin (VANCOCIN) IVPB 1000 mg/200 mL premix     1,000 mg 200 mL/hr over 60 Minutes Intravenous  Once 08/23/18 0537 08/23/18 0715   08/23/18 0000  doxycycline (VIBRAMYCIN) 100 MG capsule    Note to  Pharmacy:  Dr Lorin Mercy gave script to family member   100 mg Oral 2 times daily 08/23/18 3825         Patient was given sequential compression devices and early ambulation to prevent DVT.   Patient benefited maximally from hospital stay and there were no complications. At the time of discharge, the patient was urinating/moving their bowels without difficulty, tolerating a regular diet, pain is controlled with oral pain medications and they have been cleared by PT/OT.   Recent vital signs: No data found.   Recent laboratory studies: No results for input(s): WBC, HGB, HCT, PLT, NA, K, CL, CO2, BUN, CREATININE, GLUCOSE, INR, CALCIUM in the last 72 hours.  Invalid input(s): PT, 2   Discharge Medications:   Allergies as of 08/24/2018   No Known Allergies     Medication List    STOP taking these medications   acetaminophen 500 MG tablet Commonly known as:  TYLENOL   diclofenac 75 MG EC tablet Commonly known as:  VOLTAREN   TURMERIC PO     TAKE these medications   amLODipine 10 MG tablet Commonly known as:  NORVASC Take 1 tablet (10 mg total) by mouth daily. Need annual appointment for further refills   doxycycline 100 MG capsule Commonly known as:  VIBRAMYCIN Take 1 capsule (100 mg total) by mouth 2 (two) times daily.   fluticasone 50 MCG/ACT nasal spray Commonly known as:  FLONASE Place 2 sprays into both nostrils daily. What changed:    when to take this  reasons to take this  furosemide 20 MG tablet Commonly known as:  LASIX TAKE 1 TABLET BY MOUTH AS NEEDED. TAKE WITH 20 MEQ POTASSIUM What changed:  See the new instructions.   gabapentin 100 MG capsule Commonly known as:  NEURONTIN Take 1 capsule (100 mg total) by mouth 2 (two) times daily. What changed:    when to take this  reasons to take this   MAGNESIUM PO Take 500 mg by mouth daily.   metFORMIN 500 MG tablet Commonly known as:  GLUCOPHAGE TAKE 1 TABLET(500 MG) BY MOUTH TWICE DAILY WITH A  MEAL What changed:  See the new instructions.   methocarbamol 500 MG tablet Commonly known as:  ROBAXIN Take 1 tablet (500 mg total) by mouth every 6 (six) hours as needed for muscle spasms. What changed:  when to take this   oxyCODONE-acetaminophen 5-325 MG tablet Commonly known as:  PERCOCET/ROXICET Take 1-2 tablets by mouth every 6 (six) hours as needed for severe pain. What changed:  when to take this   potassium chloride SA 20 MEQ tablet Commonly known as:  K-DUR,KLOR-CON Take 20 mEq by mouth daily as needed (Only when taking furosemide).   spironolactone 25 MG tablet Commonly known as:  ALDACTONE Take 1 tablet (25 mg total) by mouth once for 1 dose. Need an annual appointment for further refills What changed:  when to take this       Diagnostic Studies: Dg Chest 2 View  Result Date: 08/23/2018 CLINICAL DATA:  62 y/o  F; preop testing.  No new chest complaints. EXAM: CHEST - 2 VIEW COMPARISON:  11/20/2011 chest radiograph and chest CT. FINDINGS: Stable heart size and mediastinal contours are within normal limits. Both lungs are clear. The visualized skeletal structures are unremarkable. IMPRESSION: No active cardiopulmonary disease. Electronically Signed   By: Kristine Garbe M.D.   On: 08/23/2018 06:06   Dg Lumbar Spine 2-3 Views  Result Date: 08/23/2018 CLINICAL DATA:  Localization for spinal surgery. EXAM: LUMBAR SPINE - 2-3 VIEW COMPARISON:  07/21/2018 MR FINDINGS: Intraoperative LATERAL views of the lumbar spine are submitted postoperatively for interpretation. Film 1 demonstrates 2 posterior metallic probes with the SUPERIOR probe directed at the UPPER aspect of L3 and the INFERIOR probe directed at the L3-4 interspace. Film 2 demonstrates 2 posterior metallic probes with the SUPERIOR probe directed at the L2-interspace and the INFERIOR probe between the L2 and L3 spinous processes. Film 3 demonstrates 2 posterior metallic probes with the SUPERIOR probe directed  towards the LOWER aspect of L2-3 interspace and the INFERIOR probe directed at the L3-4 interspace. IMPRESSION: As above Electronically Signed   By: Margarette Canada M.D.   On: 08/23/2018 10:13      Follow-up Information    Marybelle Killings, MD. Schedule an appointment as soon as possible for a visit in 2 day(s).   Specialty:  Orthopedic Surgery Why:  needs return office visit two week postop Contact information: [place] [address] [phone]           Discharge Plan:  discharge to home  Disposition:     Signed: Benjiman Core 08/27/2018, 4:14 PM

## 2018-09-02 ENCOUNTER — Other Ambulatory Visit: Payer: Self-pay | Admitting: Internal Medicine

## 2018-09-03 ENCOUNTER — Encounter (INDEPENDENT_AMBULATORY_CARE_PROVIDER_SITE_OTHER): Payer: Self-pay | Admitting: Orthopaedic Surgery

## 2018-09-03 ENCOUNTER — Ambulatory Visit (INDEPENDENT_AMBULATORY_CARE_PROVIDER_SITE_OTHER): Payer: Managed Care, Other (non HMO) | Admitting: Orthopaedic Surgery

## 2018-09-03 VITALS — BP 106/73 | HR 70 | Ht 66.0 in | Wt 199.0 lb

## 2018-09-03 DIAGNOSIS — Z9889 Other specified postprocedural states: Secondary | ICD-10-CM

## 2018-09-03 NOTE — Progress Notes (Signed)
Post-Op Visit Note   Patient: [name]           Date of Birth: Jul 26, 1956           MRN: [id number] Visit Date: 09/03/2018 PCP: Hoyt Koch, MD   Assessment & Plan: Follow-up L3-4 decompression removal of intraspinal extradural facet cyst.  She is got good relief for spinal stenosis symptoms incision looks good.  Continue walking program.  Chief Complaint:  Chief Complaint  Patient presents with  . Lower Back - Routine Post Op    08/23/18 L3-4 decompression, removal of intraspinal extradural facet cyst   Visit Diagnoses:  1. Status post lumbar spine surgery for decompression of spinal cord     Plan: Satisfactory decompression with removal of intraspinal extradural facet cyst at L3-4.  Work resumption on 09/30/2018 work slip given.  I plan to recheck her in 4 weeks.  Follow-Up Instructions: No follow-ups on file.   Orders:  No orders of the defined types were placed in this encounter.  No orders of the defined types were placed in this encounter.   Imaging: No results found.  PMFS History: Patient Active Problem List   Diagnosis Date Noted  . Spinal stenosis of lumbar region 08/23/2018  . Pain of back and lower extremity 07/10/2018  . Abscess 12/28/2017  . Routine general medical examination at a health care facility 07/26/2017  . Osteoarthritis of right knee 12/14/2016  . Primary osteoarthritis of left knee 06/29/2016  . Diabetes mellitus type 2, uncomplicated ([place]) 58/06/9832  . GERD (gastroesophageal reflux disease) 04/16/2015  . Constipation 04/16/2015  . Chronic venous insufficiency 02/10/2015  . Morbid obesity ([place]) 02/10/2015  . OSA (obstructive sleep apnea) 09/05/2012  . Essential hypertension, benign 12/31/2011   Past Medical History:  Diagnosis Date  . Breast cancer [facility]) 2011   right breast with axillary lymph node removal  . Diabetes mellitus, type II ([place])    type 2  . GERD (gastroesophageal reflux disease)   . Hypertension     . Osteoarthritis of both knees   . Personal history of chemotherapy   . Personal history of radiation therapy   . Pneumonia    in early 1980's    Family History  Adopted: Yes    Past Surgical History:  Procedure Laterality Date  . ABDOMINAL HYSTERECTOMY    . APPENDECTOMY  1978  . BLADDER SUSPENSION    . BREAST LUMPECTOMY Right    2011  . JOINT REPLACEMENT     bil knees  . KNEE ARTHROPLASTY Left 06/29/2016   Procedure: LEFT TOTAL KNEE WITH NAVIGATION;  Surgeon: Rod Can, MD;  Location: [facility];  Service: Orthopedics;  Laterality: Left;  . KNEE ARTHROPLASTY Right 12/14/2016   Procedure: RIGHT TOTAL KNEE ARTHROPLASTY WITH COMPUTER NAVIGATION;  Surgeon: Rod Can, MD;  Location: [facility];  Service: Orthopedics;  Laterality: Right;  Needs RNFA  . LUMBAR LAMINECTOMY/DECOMPRESSION MICRODISCECTOMY N/A 08/23/2018   Procedure: L3-4 DECOMPRESSION, REMOVAL OF INTRASPINAL EXTRADURAL FACET CYST;  Surgeon: Marybelle Killings, MD;  Location: [facility];  Service: Orthopedics;  Laterality: N/A;  . MENISCUS REPAIR     right knee  . OOPHORECTOMY     Social History   Occupational History  . Occupation: medical records    Employer: [employer]  Tobacco Use  . Smoking status: Former Smoker    Packs/day: 0.50    Years: 20.00    Pack years: 10.00    Types: Cigarettes    Last attempt to quit:  10/14/2013    Years since quitting: 4.8  . Smokeless tobacco: Never Used  Substance and Sexual Activity  . Alcohol use: Yes    Comment: rarely  . Drug use: No  . Sexual activity: Not on file

## 2018-09-10 ENCOUNTER — Telehealth (INDEPENDENT_AMBULATORY_CARE_PROVIDER_SITE_OTHER): Payer: Self-pay | Admitting: Orthopaedic Surgery

## 2018-09-10 MED ORDER — TRAMADOL HCL 50 MG PO TABS: ORAL_TABLET | ORAL | 0 refills | Status: DC

## 2018-09-10 NOTE — Telephone Encounter (Signed)
Called to pharmacy 

## 2018-09-10 NOTE — Telephone Encounter (Signed)
Patient called to request an RX refill on her Oxycodone.  CB#[id number]

## 2018-09-10 NOTE — Telephone Encounter (Signed)
Please advise. OK for refill? 

## 2018-09-10 NOTE — Telephone Encounter (Signed)
Ultram # 40  One po tid prn post op pain.   [facility] in [place].

## 2018-10-01 ENCOUNTER — Encounter (INDEPENDENT_AMBULATORY_CARE_PROVIDER_SITE_OTHER): Payer: Self-pay | Admitting: Orthopaedic Surgery

## 2018-10-01 ENCOUNTER — Ambulatory Visit (INDEPENDENT_AMBULATORY_CARE_PROVIDER_SITE_OTHER): Payer: Managed Care, Other (non HMO) | Admitting: Orthopaedic Surgery

## 2018-10-01 VITALS — BP 125/81 | HR 69 | Ht 66.0 in | Wt 196.0 lb

## 2018-10-01 DIAGNOSIS — Z9889 Other specified postprocedural states: Secondary | ICD-10-CM

## 2018-10-01 NOTE — Progress Notes (Signed)
Post-Op Visit Note   Patient: [name]           Date of Birth: 08/07/1956           MRN: [id number] Visit Date: 10/01/2018 PCP: Hoyt Koch, MD   Assessment & Plan: Follow-up decompression for removal of intraspinal extradural cyst.  She is gotten good relief of her neurogenic claudication symptoms and radicular symptoms from the surgery.  She had trace drainage from her incision and had past history of positive PCR she will retreat with the mupirocin in her nose and still has some chlorhexidine soap for bath and washing her hair.  If she has any increased drainage she will call.  Return as needed.  Chief Complaint:  Chief Complaint  Patient presents with  . Lower Back - Follow-up    08/23/18 L3-4 Decompression/Removal of Intraspinal Extradural Facet Cyst   Visit Diagnoses: Postop lumbar decompression  Plan: Patient is released from care she is happy with the surgical result return PRN.  Follow-Up Instructions: No follow-ups on file.   Orders:  No orders of the defined types were placed in this encounter.  No orders of the defined types were placed in this encounter.   Imaging: No results found.  PMFS History: Patient Active Problem List   Diagnosis Date Noted  . Spinal stenosis of lumbar region 08/23/2018  . Pain of back and lower extremity 07/10/2018  . Abscess 12/28/2017  . Routine general medical examination at a health care facility 07/26/2017  . Osteoarthritis of right knee 12/14/2016  . Primary osteoarthritis of left knee 06/29/2016  . Diabetes mellitus type 2, uncomplicated ([place]) 61/60/7371  . GERD (gastroesophageal reflux disease) 04/16/2015  . Constipation 04/16/2015  . Chronic venous insufficiency 02/10/2015  . Morbid obesity ([place]) 02/10/2015  . OSA (obstructive sleep apnea) 09/05/2012  . Essential hypertension, benign 12/31/2011   Past Medical History:  Diagnosis Date  . Breast cancer [facility]) 2011   right breast with axillary lymph  node removal  . Diabetes mellitus, type II ([place])    type 2  . GERD (gastroesophageal reflux disease)   . Hypertension   . Osteoarthritis of both knees   . Personal history of chemotherapy   . Personal history of radiation therapy   . Pneumonia    in early 1980's    Family History  Adopted: Yes    Past Surgical History:  Procedure Laterality Date  . ABDOMINAL HYSTERECTOMY    . APPENDECTOMY  1978  . BLADDER SUSPENSION    . BREAST LUMPECTOMY Right    2011  . JOINT REPLACEMENT     bil knees  . KNEE ARTHROPLASTY Left 06/29/2016   Procedure: LEFT TOTAL KNEE WITH NAVIGATION;  Surgeon: Rod Can, MD;  Location: [facility];  Service: Orthopedics;  Laterality: Left;  . KNEE ARTHROPLASTY Right 12/14/2016   Procedure: RIGHT TOTAL KNEE ARTHROPLASTY WITH COMPUTER NAVIGATION;  Surgeon: Rod Can, MD;  Location: [facility];  Service: Orthopedics;  Laterality: Right;  Needs RNFA  . LUMBAR LAMINECTOMY/DECOMPRESSION MICRODISCECTOMY N/A 08/23/2018   Procedure: L3-4 DECOMPRESSION, REMOVAL OF INTRASPINAL EXTRADURAL FACET CYST;  Surgeon: Marybelle Killings, MD;  Location: [place];  Service: Orthopedics;  Laterality: N/A;  . MENISCUS REPAIR     right knee  . OOPHORECTOMY     Social History   Occupational History  . Occupation: medical records    Employer: [employer]  Tobacco Use  . Smoking status: Former Smoker    Packs/day: 0.50    Years:  20.00    Pack years: 10.00    Types: Cigarettes    Last attempt to quit: 10/14/2013    Years since quitting: 4.9  . Smokeless tobacco: Never Used  Substance and Sexual Activity  . Alcohol use: Yes    Comment: rarely  . Drug use: No  . Sexual activity: Not on file

## 2018-10-16 ENCOUNTER — Other Ambulatory Visit: Payer: Self-pay | Admitting: Internal Medicine

## 2018-10-16 DIAGNOSIS — Z1231 Encounter for screening mammogram for malignant neoplasm of breast: Secondary | ICD-10-CM

## 2018-10-29 ENCOUNTER — Other Ambulatory Visit: Payer: Self-pay | Admitting: Internal Medicine

## 2018-10-29 DIAGNOSIS — I5022 Chronic systolic (congestive) heart failure: Secondary | ICD-10-CM

## 2018-11-13 ENCOUNTER — Ambulatory Visit
Admission: RE | Admit: 2018-11-13 | Discharge: 2018-11-13 | Disposition: A | Discharge status: Home / Self Care | Payer: 59 | Source: Ambulatory Visit | Attending: Internal Medicine | Admitting: Internal Medicine

## 2018-11-13 DIAGNOSIS — Z1231 Encounter for screening mammogram for malignant neoplasm of breast: Secondary | ICD-10-CM | POA: Diagnosis not present

## 2018-11-14 ENCOUNTER — Other Ambulatory Visit: Payer: Self-pay | Admitting: Internal Medicine

## 2018-11-14 DIAGNOSIS — R928 Other abnormal and inconclusive findings on diagnostic imaging of breast: Secondary | ICD-10-CM

## 2018-11-25 ENCOUNTER — Other Ambulatory Visit: Payer: Self-pay | Admitting: Internal Medicine

## 2018-11-25 ENCOUNTER — Ambulatory Visit
Admission: RE | Admit: 2018-11-25 | Discharge: 2018-11-25 | Disposition: A | Discharge status: Home / Self Care | Payer: 59 | Source: Ambulatory Visit | Attending: Internal Medicine | Admitting: Internal Medicine

## 2018-11-25 DIAGNOSIS — R928 Other abnormal and inconclusive findings on diagnostic imaging of breast: Secondary | ICD-10-CM

## 2018-11-25 DIAGNOSIS — N6012 Diffuse cystic mastopathy of left breast: Secondary | ICD-10-CM | POA: Diagnosis not present

## 2018-11-25 DIAGNOSIS — N63 Unspecified lump in unspecified breast: Secondary | ICD-10-CM

## 2018-11-25 DIAGNOSIS — Z853 Personal history of malignant neoplasm of breast: Secondary | ICD-10-CM | POA: Diagnosis not present

## 2018-12-23 ENCOUNTER — Other Ambulatory Visit: Payer: Self-pay

## 2018-12-23 ENCOUNTER — Encounter: Payer: Self-pay | Admitting: Internal Medicine

## 2018-12-23 ENCOUNTER — Other Ambulatory Visit (INDEPENDENT_AMBULATORY_CARE_PROVIDER_SITE_OTHER): Payer: 59

## 2018-12-23 ENCOUNTER — Telehealth: Payer: Self-pay

## 2018-12-23 ENCOUNTER — Ambulatory Visit (INDEPENDENT_AMBULATORY_CARE_PROVIDER_SITE_OTHER): Payer: 59 | Admitting: Internal Medicine

## 2018-12-23 VITALS — BP 126/90 | HR 74 | Temp 98.3°F | Ht 66.0 in | Wt 207.0 lb

## 2018-12-23 DIAGNOSIS — Z0000 Encounter for general adult medical examination without abnormal findings: Secondary | ICD-10-CM | POA: Diagnosis not present

## 2018-12-23 DIAGNOSIS — E119 Type 2 diabetes mellitus without complications: Secondary | ICD-10-CM | POA: Diagnosis not present

## 2018-12-23 DIAGNOSIS — E669 Obesity, unspecified: Secondary | ICD-10-CM

## 2018-12-23 DIAGNOSIS — I10 Essential (primary) hypertension: Secondary | ICD-10-CM

## 2018-12-23 LAB — CBC
HCT: 41.9 % (ref 36.0–46.0)
HEMOGLOBIN: 13.9 g/dL (ref 12.0–15.0)
MCHC: 33.1 g/dL (ref 30.0–36.0)
MCV: 85.4 fl (ref 78.0–100.0)
PLATELETS: 449 10*3/uL — AB (ref 150.0–400.0)
RBC: 4.91 Mil/uL (ref 3.87–5.11)
RDW: 14.1 % (ref 11.5–15.5)
WBC: 6.1 10*3/uL (ref 4.0–10.5)

## 2018-12-23 LAB — COMPREHENSIVE METABOLIC PANEL
ALK PHOS: 82 U/L (ref 39–117)
ALT: 12 U/L (ref 0–35)
AST: 17 U/L (ref 0–37)
Albumin: 4.6 g/dL (ref 3.5–5.2)
BILIRUBIN TOTAL: 0.8 mg/dL (ref 0.2–1.2)
BUN: 13 mg/dL (ref 6–23)
CO2: 27 mEq/L (ref 19–32)
CREATININE: 1 mg/dL (ref 0.40–1.20)
Calcium: 10.3 mg/dL (ref 8.4–10.5)
Chloride: 101 mEq/L (ref 96–112)
GFR: 67.76 mL/min (ref >60.00)
Glucose, Bld: 114 mg/dL — ABNORMAL HIGH (ref 70–99)
Potassium: 4.7 mEq/L (ref 3.5–5.1)
Sodium: 136 mEq/L (ref 135–145)
Total Protein: 8.1 g/dL (ref 6.0–8.3)

## 2018-12-23 LAB — LIPID PANEL
Cholesterol: 174 mg/dL (ref 0–200)
HDL: 45 mg/dL (ref >39.00)
LDL Cholesterol: 99 mg/dL (ref 0–99)
NONHDL: 128.54
TRIGLYCERIDES: 146 mg/dL (ref 0.0–149.0)
Total CHOL/HDL Ratio: 4
VLDL: 29.2 mg/dL (ref 0.0–40.0)

## 2018-12-23 LAB — MICROALBUMIN / CREATININE URINE RATIO
CREATININE, U: 26.5 mg/dL
MICROALB/CREAT RATIO: 2.6 mg/g (ref 0.0–30.0)

## 2018-12-23 LAB — HEMOGLOBIN A1C: Hgb A1c MFr Bld: 7.1 % — ABNORMAL HIGH (ref 4.6–6.5)

## 2018-12-23 MED ORDER — AMLODIPINE BESYLATE 10 MG PO TABS: 10.0000 mg | ORAL_TABLET | Freq: Every day | ORAL | 3 refills | Status: DC

## 2018-12-23 MED ORDER — TRIAMCINOLONE ACETONIDE 0.1 % EX CREA: 1.0000 "application " | TOPICAL_CREAM | Freq: Two times a day (BID) | CUTANEOUS | 0 refills | Status: DC

## 2018-12-23 MED ORDER — SPIRONOLACTONE 25 MG PO TABS: 25.0000 mg | ORAL_TABLET | Freq: Every day | ORAL | 3 refills | Status: DC

## 2018-12-23 NOTE — Patient Instructions (Signed)
We have sent in cream to use twice a day on the ears when needed. Use vaseline daily once healed to help this from returning.   We are checking the labs today and have sent in refills of the amlodipine and spironolactone for blood pressure.   Health Maintenance, Female Adopting a healthy lifestyle and getting preventive care can go a long way to promote health and wellness. Talk with your health care provider about what schedule of regular examinations is right for you. This is a good chance for you to check in with your provider about disease prevention and staying healthy. In between checkups, there are plenty of things you can do on your own. Experts have done a lot of research about which lifestyle changes and preventive measures are most likely to keep you healthy. Ask your health care provider for more information. Weight and diet Eat a healthy diet  Be sure to include plenty of vegetables, fruits, low-fat dairy products, and lean protein.  Do not eat a lot of foods high in solid fats, added sugars, or salt.  Get regular exercise. This is one of the most important things you can do for your health. ? Most adults should exercise for at least 150 minutes each week. The exercise should increase your heart rate and make you sweat (moderate-intensity exercise). ? Most adults should also do strengthening exercises at least twice a week. This is in addition to the moderate-intensity exercise. Maintain a healthy weight  Body mass index (BMI) is a measurement that can be used to identify possible weight problems. It estimates body fat based on height and weight. Your health care provider can help determine your BMI and help you achieve or maintain a healthy weight.  For females 68 years of age and older: ? A BMI below 18.5 is considered underweight. ? A BMI of 18.5 to 24.9 is normal. ? A BMI of 25 to 29.9 is considered overweight. ? A BMI of 30 and above is considered obese. Watch levels of  cholesterol and blood lipids  You should start having your blood tested for lipids and cholesterol at 63 years of age, then have this test every 5 years.  You may need to have your cholesterol levels checked more often if: ? Your lipid or cholesterol levels are high. ? You are older than 63 years of age. ? You are at high risk for heart disease. Cancer screening Lung Cancer  Lung cancer screening is recommended for adults 34-21 years old who are at high risk for lung cancer because of a history of smoking.  A yearly low-dose CT scan of the lungs is recommended for people who: ? Currently smoke. ? Have quit within the past 15 years. ? Have at least a 30-pack-year history of smoking. A pack year is smoking an average of one pack of cigarettes a day for 1 year.  Yearly screening should continue until it has been 15 years since you quit.  Yearly screening should stop if you develop a health problem that would prevent you from having lung cancer treatment. Breast Cancer  Practice breast self-awareness. This means understanding how your breasts normally appear and feel.  It also means doing regular breast self-exams. Let your health care provider know about any changes, no matter how small.  If you are in your 20s or 30s, you should have a clinical breast exam (CBE) by a health care provider every 1-3 years as part of a regular health exam.  If you  are 17 or older, have a CBE every year. Also consider having a breast X-ray (mammogram) every year.  If you have a family history of breast cancer, talk to your health care provider about genetic screening.  If you are at high risk for breast cancer, talk to your health care provider about having an MRI and a mammogram every year.  Breast cancer gene (BRCA) assessment is recommended for women who have family members with BRCA-related cancers. BRCA-related cancers include: ? Breast. ? Ovarian. ? Tubal. ? Peritoneal cancers.  Results of  the assessment will determine the need for genetic counseling and BRCA1 and BRCA2 testing. Cervical Cancer Your health care provider may recommend that you be screened regularly for cancer of the pelvic organs (ovaries, uterus, and vagina). This screening involves a pelvic examination, including checking for microscopic changes to the surface of your cervix (Pap test). You may be encouraged to have this screening done every 3 years, beginning at age 105.  For women ages 13-65, health care providers may recommend pelvic exams and Pap testing every 3 years, or they may recommend the Pap and pelvic exam, combined with testing for human papilloma virus (HPV), every 5 years. Some types of HPV increase your risk of cervical cancer. Testing for HPV may also be done on women of any age with unclear Pap test results.  Other health care providers may not recommend any screening for nonpregnant women who are considered low risk for pelvic cancer and who do not have symptoms. Ask your health care provider if a screening pelvic exam is right for you.  If you have had past treatment for cervical cancer or a condition that could lead to cancer, you need Pap tests and screening for cancer for at least 20 years after your treatment. If Pap tests have been discontinued, your risk factors (such as having a new sexual partner) need to be reassessed to determine if screening should resume. Some women have medical problems that increase the chance of getting cervical cancer. In these cases, your health care provider may recommend more frequent screening and Pap tests. Colorectal Cancer  This type of cancer can be detected and often prevented.  Routine colorectal cancer screening usually begins at 63 years of age and continues through 63 years of age.  Your health care provider may recommend screening at an earlier age if you have risk factors for colon cancer.  Your health care provider may also recommend using home test  kits to check for hidden blood in the stool.  A small camera at the end of a tube can be used to examine your colon directly (sigmoidoscopy or colonoscopy). This is done to check for the earliest forms of colorectal cancer.  Routine screening usually begins at age 30.  Direct examination of the colon should be repeated every 5-10 years through 62 years of age. However, you may need to be screened more often if early forms of precancerous polyps or small growths are found. Skin Cancer  Check your skin from head to toe regularly.  Tell your health care provider about any new moles or changes in moles, especially if there is a change in a mole's shape or color.  Also tell your health care provider if you have a mole that is larger than the size of a pencil eraser.  Always use sunscreen. Apply sunscreen liberally and repeatedly throughout the day.  Protect yourself by wearing long sleeves, pants, a wide-brimmed hat, and sunglasses whenever you are outside.  Heart disease, diabetes, and high blood pressure  High blood pressure causes heart disease and increases the risk of stroke. High blood pressure is more likely to develop in: ? People who have blood pressure in the high end of the normal range (130-139/85-89 mm Hg). ? People who are overweight or obese. ? People who are African American.  If you are 58-24 years of age, have your blood pressure checked every 3-5 years. If you are 49 years of age or older, have your blood pressure checked every year. You should have your blood pressure measured twice-once when you are at a hospital or clinic, and once when you are not at a hospital or clinic. Record the average of the two measurements. To check your blood pressure when you are not at a hospital or clinic, you can use: ? An automated blood pressure machine at a pharmacy. ? A home blood pressure monitor.  If you are between 71 years and 65 years old, ask your health care provider if you should  take aspirin to prevent strokes.  Have regular diabetes screenings. This involves taking a blood sample to check your fasting blood sugar level. ? If you are at a normal weight and have a low risk for diabetes, have this test once every three years after 63 years of age. ? If you are overweight and have a high risk for diabetes, consider being tested at a younger age or more often. Preventing infection Hepatitis B  If you have a higher risk for hepatitis B, you should be screened for this virus. You are considered at high risk for hepatitis B if: ? You were born in a country where hepatitis B is common. Ask your health care provider which countries are considered high risk. ? Your parents were born in a high-risk country, and you have not been immunized against hepatitis B (hepatitis B vaccine). ? You have HIV or AIDS. ? You use needles to inject street drugs. ? You live with someone who has hepatitis B. ? You have had sex with someone who has hepatitis B. ? You get hemodialysis treatment. ? You take certain medicines for conditions, including cancer, organ transplantation, and autoimmune conditions. Hepatitis C  Blood testing is recommended for: ? Everyone born from [birth year] through 1965. ? Anyone with known risk factors for hepatitis C. Sexually transmitted infections (STIs)  You should be screened for sexually transmitted infections (STIs) including gonorrhea and chlamydia if: ? You are sexually active and are younger than 63 years of age. ? You are older than 63 years of age and your health care provider tells you that you are at risk for this type of infection. ? Your sexual activity has changed since you were last screened and you are at an increased risk for chlamydia or gonorrhea. Ask your health care provider if you are at risk.  If you do not have HIV, but are at risk, it may be recommended that you take a prescription medicine daily to prevent HIV infection. This is called  pre-exposure prophylaxis (PrEP). You are considered at risk if: ? You are sexually active and do not regularly use condoms or know the HIV status of your partner(s). ? You take drugs by injection. ? You are sexually active with a partner who has HIV. Talk with your health care provider about whether you are at high risk of being infected with HIV. If you choose to begin PrEP, you should first be tested for HIV. You should then  be tested every 3 months for as long as you are taking PrEP. Pregnancy  If you are premenopausal and you may become pregnant, ask your health care provider about preconception counseling.  If you may become pregnant, take 400 to 800 micrograms (mcg) of folic acid every day.  If you want to prevent pregnancy, talk to your health care provider about birth control (contraception). Osteoporosis and menopause  Osteoporosis is a disease in which the bones lose minerals and strength with aging. This can result in serious bone fractures. Your risk for osteoporosis can be identified using a bone density scan.  If you are 79 years of age or older, or if you are at risk for osteoporosis and fractures, ask your health care provider if you should be screened.  Ask your health care provider whether you should take a calcium or vitamin D supplement to lower your risk for osteoporosis.  Menopause may have certain physical symptoms and risks.  Hormone replacement therapy may reduce some of these symptoms and risks. Talk to your health care provider about whether hormone replacement therapy is right for you. Follow these instructions at home:  Schedule regular health, dental, and eye exams.  Stay current with your immunizations.  Do not use any tobacco products including cigarettes, chewing tobacco, or electronic cigarettes.  If you are pregnant, do not drink alcohol.  If you are breastfeeding, limit how much and how often you drink alcohol.  Limit alcohol intake to no more  than 1 drink per day for nonpregnant women. One drink equals 12 ounces of beer, 5 ounces of wine, or 1 ounces of hard liquor.  Do not use street drugs.  Do not share needles.  Ask your health care provider for help if you need support or information about quitting drugs.  Tell your health care provider if you often feel depressed.  Tell your health care provider if you have ever been abused or do not feel safe at home. This information is not intended to replace advice given to you by your health care provider. Make sure you discuss any questions you have with your health care provider. Document Released: 04/03/2011 Document Revised: 02/24/2016 Document Reviewed: 06/22/2015 Elsevier Interactive Patient Education  2019 [employer].

## 2018-12-23 NOTE — Assessment & Plan Note (Signed)
Flu shot up to date. Pneumonia declines. Shingrix counseled. Tetanus declines. Colonoscopy up to date. Mammogram up to date, pap smear not indicated and dexa not indicated. Counseled about sun safety and mole surveillance. Counseled about the dangers of distracted driving. Given 10 year screening recommendations.

## 2018-12-23 NOTE — Progress Notes (Signed)
   Subjective:   Patient ID: [name], female    DOB: Jul 12, 1956, 63 y.o.   MRN: [id number]  HPI The patient is a 63 YO female coming in for physical. Has been out of meds for months due to lack of visit.   PMH, [facility], social history reviewed and updated  Review of Systems  Constitutional: Negative.   HENT: Negative.   Eyes: Negative.   Respiratory: Negative for cough, chest tightness and shortness of breath.   Cardiovascular: Negative for chest pain, palpitations and leg swelling.  Gastrointestinal: Negative for abdominal distention, abdominal pain, constipation, diarrhea, nausea and vomiting.  Musculoskeletal: Negative.   Skin: Negative.   Neurological: Positive for headaches.  Psychiatric/Behavioral: Negative.     Objective:  Physical Exam Constitutional:      Appearance: She is well-developed.  HENT:     Head: Normocephalic and atraumatic.  Neck:     Musculoskeletal: Normal range of motion.  Cardiovascular:     Rate and Rhythm: Normal rate and regular rhythm.  Pulmonary:     Effort: Pulmonary effort is normal. No respiratory distress.     Breath sounds: Normal breath sounds. No wheezing or rales.  Abdominal:     General: Bowel sounds are normal. There is no distension.     Palpations: Abdomen is soft.     Tenderness: There is no abdominal tenderness. There is no rebound.  Skin:    General: Skin is warm and dry.  Neurological:     Mental Status: She is alert and oriented to person, place, and time.     Coordination: Coordination normal.     Vitals:   12/23/18 0859 12/23/18 0925  BP: (!) 150/100 126/90  Pulse: 74   Temp: 98.3 F (36.8 C)   TempSrc: Oral   SpO2: 99%   Weight: 207 lb (93.9 kg)   Height: 5\' 6"  (1.676 m)     Assessment & Plan:

## 2018-12-23 NOTE — Assessment & Plan Note (Signed)
BP above goal off meds. Refill amlodipine and spironolactone which she was controlled on previously. Checking CMP and adjust as needed.

## 2018-12-23 NOTE — Telephone Encounter (Signed)
LVM for patient to call back and let us know if she is having any upper respiratory symptoms. Fevers, SOB, cough, or chills.

## 2018-12-23 NOTE — Assessment & Plan Note (Signed)
Checking labs, not on ARB or statin at this time. Not on meds due to running out. Checking HgA1c and microalbumin to creatinine ratio and adjust as needed.

## 2018-12-23 NOTE — Assessment & Plan Note (Signed)
Weight is stable, she is working on exercise and diet changes.

## 2019-03-29 ENCOUNTER — Telehealth: Payer: 59 | Admitting: Family

## 2019-03-29 DIAGNOSIS — R0600 Dyspnea, unspecified: Secondary | ICD-10-CM

## 2019-03-29 NOTE — Progress Notes (Signed)
Based on what you shared with me, I feel your condition warrants further evaluation and I recommend that you be seen for a face to face office visit.  NOTE: If you entered your credit card information for this eVisit, you will not be charged. You may see a "hold" on your card for the $35 but that hold will drop off and you will not have a charge processed.  If you are having a true medical emergency please call 911.     For an urgent face to face visit, [place] has five urgent care centers for your convenience:    [email or web address] to reserve your spot online an avoid wait times  [facility] [address], [address] [place], [place] [address] Modified hours of operation: Monday-Friday, 12 PM to 6 PM  Closed Saturday & Sunday  *Across the street from [place] (New Address!) [address], [place], [place] [address] *Just off [address], across the road from [place] hours of operation: Monday-Friday, 12 PM to 6 PM  Closed Saturday & Sunday   The following sites will take your insurance:  . [facility]    [phone]                  Get Driving Directions  [address], [place] [address] . 10 am to 8 pm Monday-Friday . 12 pm to 8 pm Saturday-Sunday   . [facility] [place]                  Get Driving Directions  [address], [place], [place] [address] . 8 am to 8 pm Monday-Friday . 9 am to 6 pm Saturday . 11 am to 6 pm Sunday   . [facility] at [place]                  Get Driving Directions   [address] [place], [place] [address] . 8 am to 8 pm Monday-Friday . 8 am to 4 pm Saturday-Sunday    . [facility] [place]                    Get Driving Directions  [phone]  [address], [place], [place] [address]   . Monday-Friday, 12 PM to 6 PM    Your e-visit answers were reviewed by a board certified advanced clinical practitioner to complete your personal care plan.  Thank you for using e-Visits.

## 2019-03-31 ENCOUNTER — Other Ambulatory Visit: Payer: Self-pay

## 2019-03-31 ENCOUNTER — Encounter (HOSPITAL_COMMUNITY): Payer: Self-pay | Admitting: Emergency Medicine

## 2019-03-31 ENCOUNTER — Encounter: Payer: Self-pay | Admitting: Internal Medicine

## 2019-03-31 ENCOUNTER — Ambulatory Visit (INDEPENDENT_AMBULATORY_CARE_PROVIDER_SITE_OTHER): Payer: 59 | Admitting: Internal Medicine

## 2019-03-31 ENCOUNTER — Other Ambulatory Visit: Payer: Self-pay | Admitting: Internal Medicine

## 2019-03-31 ENCOUNTER — Ambulatory Visit (HOSPITAL_COMMUNITY)
Admission: EM | Admit: 2019-03-31 | Discharge: 2019-03-31 | Disposition: A | Discharge status: Home / Self Care | Payer: 59 | Attending: Family Medicine | Admitting: Family Medicine

## 2019-03-31 ENCOUNTER — Other Ambulatory Visit: Payer: 59

## 2019-03-31 DIAGNOSIS — Z20822 Contact with and (suspected) exposure to covid-19: Secondary | ICD-10-CM

## 2019-03-31 DIAGNOSIS — R6889 Other general symptoms and signs: Secondary | ICD-10-CM | POA: Diagnosis not present

## 2019-03-31 DIAGNOSIS — R3915 Urgency of urination: Secondary | ICD-10-CM

## 2019-03-31 DIAGNOSIS — R32 Unspecified urinary incontinence: Secondary | ICD-10-CM | POA: Diagnosis not present

## 2019-03-31 DIAGNOSIS — Z20828 Contact with and (suspected) exposure to other viral communicable diseases: Secondary | ICD-10-CM

## 2019-03-31 DIAGNOSIS — R05 Cough: Secondary | ICD-10-CM | POA: Diagnosis not present

## 2019-03-31 DIAGNOSIS — R0602 Shortness of breath: Secondary | ICD-10-CM | POA: Diagnosis not present

## 2019-03-31 LAB — POCT URINALYSIS DIP (DEVICE)
Bilirubin Urine: NEGATIVE
Glucose, UA: NEGATIVE mg/dL
Hgb urine dipstick: NEGATIVE
Ketones, ur: NEGATIVE mg/dL
Leukocytes,Ua: NEGATIVE
Nitrite: NEGATIVE
Protein, ur: NEGATIVE mg/dL
Specific Gravity, Urine: 1.015 (ref 1.005–1.030)
Urobilinogen, UA: 0.2 mg/dL (ref 0.0–1.0)
pH: 6 (ref 5.0–8.0)

## 2019-03-31 NOTE — Progress Notes (Signed)
Virtual Visit via Audio Note  I connected with [name] on 03/31/19 at 10:00 AM EDT by an audio-only enabled telemedicine application and verified that I am speaking with the correct person using two identifiers.  The patient and the provider were at separate locations throughout the entire encounter.   I discussed the limitations of evaluation and management by telemedicine and the availability of in person appointments. The patient expressed understanding and agreed to proceed.  History of Present Illness: The patient is a 63 y.o. female with visit for many concerns including covid symptoms (coughing, chest tightness, SOB with walking, chills and body aches, started 2-3 days ago, got covid-19 testing this morning, had e-visit over the weekend and was advised to go seek an in person visit, overall worsening), and urinary incontinence (she is having urgency, not making it to the bathroom and having accidents, some pain in the low stomach which radiates around to her back, denies injury recently, other symptoms as above, denies diarrhea or constipation).  Observations/Objective: Voice strong, mild dyspnea but able to speak in full sentences, some coughing during visit  Assessment and Plan: See problem oriented charting  Follow Up Instructions: go to ER or urgent care for further evaluation  Visit time 7 minutes: that time was spent in face to face counseling and coordination of care with the patient: counseled about as above  I discussed the assessment and treatment plan with the patient. The patient was provided an opportunity to ask questions and all were answered. The patient agreed with the plan and demonstrated an understanding of the instructions.   The patient was advised to call back or seek an in-person evaluation if the symptoms worsen or if the condition fails to improve as anticipated.  Hoyt Koch, MD

## 2019-03-31 NOTE — ED Provider Notes (Signed)
[place]    CSN: [id number] Arrival date & time: 03/31/19  1019     History   Chief Complaint Chief Complaint  Patient presents with  . Abdominal Pain  . Dysuria    HPI [name] is a 63 y.o. female history of type 2 diabetes, GERD, hypertension, obesity presenting for urinary urgency.  Patient states that she is noticed this off and on for the last few months though has been getting worse over the last week.  States that she "has a hard time holding it ".  Denies fecal incontinence, painful bowel movements, constipation.  Patient denies burning with urination, urinary frequency, hematuria, abdominal or back pain.,  Patient states that she completed COVID screening earlier this morning in [place].  States that she has been at home during quarantine, though is concerned about family members.  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  Patient denies known sick contacts, close contact with COVID positive persons.  Patient has had subjective fever with intermittent chills, myalgias, malaise.  Patient states that last night she had a tickle in her throat with infrequent, dry cough.  Patient denies productive cough, wheezing, shortness of breath, chest pain.   Past Medical History:  Diagnosis Date  . Breast cancer [facility]) 2011   right breast with axillary lymph node removal  . Diabetes mellitus, type II ([place])    type 2  . GERD (gastroesophageal reflux disease)   . Hypertension   . Osteoarthritis of both knees   . Personal history of chemotherapy   . Personal history of radiation therapy   . Pneumonia    in early 1980's    Patient Active Problem List   Diagnosis Date Noted  . Suspected 2019 Novel Coronavirus Infection 03/31/2019  . Pain of back and lower extremity 07/10/2018  . Routine general medical examination at a health care facility 07/26/2017  . Osteoarthritis of right  knee 12/14/2016  . Primary osteoarthritis of left knee 06/29/2016  . Diabetes mellitus type 2, uncomplicated ([place]) 35/36/1443  . GERD (gastroesophageal reflux disease) 04/16/2015  . Constipation 04/16/2015  . Chronic venous insufficiency 02/10/2015  . Obesity (BMI 30.0-34.9) 02/10/2015  . OSA (obstructive sleep apnea) 09/05/2012  . Essential hypertension, benign 12/31/2011    Past Surgical History:  Procedure Laterality Date  . ABDOMINAL HYSTERECTOMY    . APPENDECTOMY  1978  . BLADDER SUSPENSION    . BREAST LUMPECTOMY Right    2011  . JOINT REPLACEMENT     bil knees  . KNEE ARTHROPLASTY Left 06/29/2016   Procedure: LEFT TOTAL KNEE WITH NAVIGATION;  Surgeon: Rod Can, MD;  Location: [facility];  Service: Orthopedics;  Laterality: Left;  . KNEE ARTHROPLASTY Right 12/14/2016   Procedure: RIGHT TOTAL KNEE ARTHROPLASTY WITH COMPUTER NAVIGATION;  Surgeon: Rod Can, MD;  Location: [facility];  Service: Orthopedics;  Laterality: Right;  Needs RNFA  . LUMBAR LAMINECTOMY/DECOMPRESSION MICRODISCECTOMY N/A 08/23/2018   Procedure: L3-4 DECOMPRESSION, REMOVAL OF INTRASPINAL EXTRADURAL FACET CYST;  Surgeon: Marybelle Killings, MD;  Location: [place];  Service: Orthopedics;  Laterality: N/A;  . MENISCUS REPAIR     right knee  . OOPHORECTOMY      OB History   No obstetric history on file.      Home Medications    Prior to Admission medications   Medication Sig Start Date End Date Taking? Authorizing Provider  amLODipine (NORVASC) 10 MG tablet  Take 1 tablet (10 mg total) by mouth daily. 12/23/18   Hoyt Koch, MD  MAGNESIUM PO Take 500 mg by mouth daily.     [provider]  metFORMIN (GLUCOPHAGE) 500 MG tablet TAKE 1 TABLET(500 MG) BY MOUTH TWICE DAILY WITH A MEAL Patient taking differently: Take 500 mg by mouth 2 (two) times daily with a meal.  06/17/18   Hoyt Koch, MD  spironolactone (ALDACTONE) 25 MG tablet Take 1 tablet (25 mg total) by mouth daily. 12/23/18    Hoyt Koch, MD  triamcinolone cream (KENALOG) 0.1 % Apply 1 application topically 2 (two) times daily. 12/23/18   Hoyt Koch, MD  Olmesartan-Amlodipine-HCTZ [facility]) 40-5-25 MG TABS Take 1 tablet by mouth daily.  12/20/11  [provider]  potassium chloride 20 MEQ TBCR Take 20 mEq by mouth as needed. 10/29/13 06/29/14  Conrad [place], NP    Family History Family History  Adopted: Yes  Problem Relation Age of Onset  . Breast cancer Maternal Aunt     Social History Social History   Tobacco Use  . Smoking status: Former Smoker    Packs/day: 0.50    Years: 20.00    Pack years: 10.00    Types: Cigarettes    Quit date: 10/14/2013    Years since quitting: 5.4  . Smokeless tobacco: Never Used  Substance Use Topics  . Alcohol use: Yes    Comment: rarely  . Drug use: No     Allergies   Patient has no known allergies.   Review of Systems As per HPI   Physical Exam Triage Vital Signs ED Triage Vitals  Enc Vitals Group     BP 03/31/19 1103 (!) 137/93     Pulse Rate 03/31/19 1103 83     Resp 03/31/19 1103 18     Temp 03/31/19 1103 99 F (37.2 C)     Temp Source 03/31/19 1103 Oral     SpO2 03/31/19 1103 97 %     Weight --      Height --      Head Circumference --      Peak Flow --      Pain Score 03/31/19 1104 5     Pain Loc --      Pain Edu? --      Excl. in [place]? --    No data found.  Updated Vital Signs BP (!) 137/93 (BP Location: Right Arm)   Pulse 83   Temp 99 F (37.2 C) (Oral)   Resp 18   SpO2 97%   Visual Acuity Right Eye Distance:   Left Eye Distance:   Bilateral Distance:    Right Eye Near:   Left Eye Near:    Bilateral Near:     Physical Exam Constitutional:      General: She is not in acute distress.    Appearance: She is obese. She is not ill-appearing.  HENT:     Head: Normocephalic and atraumatic.     Mouth/Throat:     Mouth: Mucous membranes are moist.     Pharynx: Oropharynx is clear. No pharyngeal  swelling or oropharyngeal exudate.  Eyes:     General: No scleral icterus.    Pupils: Pupils are equal, round, and reactive to light.  Cardiovascular:     Rate and Rhythm: Normal rate.  Pulmonary:     Effort: Pulmonary effort is normal. No respiratory distress.     Breath sounds: Normal breath sounds.  Skin:    Coloration: Skin is not jaundiced or pale.  Neurological:     Mental Status: She is alert and oriented to person, place, and time.      UC Treatments / Results  Labs (all labs ordered are listed, but only abnormal results are displayed) Labs Reviewed  POCT URINALYSIS DIP (DEVICE)    EKG None  Radiology No results found.  Procedures Procedures (including critical care time)  Medications Ordered in UC Medications - No data to display  Initial Impression / Assessment and Plan / UC Course  I have reviewed the triage vital signs and the nursing notes.  Pertinent labs & imaging results that were available during my care of the patient were reviewed by me and considered in my medical decision making (see chart for details).     Pleasant 63 year old female with history of diabetes, hypertension, sending for urinary urgency and constellation of symptoms concerning for COVID.  Patient already underwent COVID testing, will self isolate until results are back.  Patient is hemodynamically stable in office, oxygen saturation greater than 96%.  Patient is calm, no acute distress in the office.  Discussed restrictions/distancing protocols for family members and home as well as use of Tylenol, water, return precautions for difficulty breathing if she should test positive.  Patient verbalized understanding.  POCT urinalysis done: Negative.  Only symptom is urgency, will follow up with PCP regarding further evaluation/management of this.  All, patient was reassured, agreeable to plan. Final Clinical Impressions(s) / UC Diagnoses   Final diagnoses:  Urinary urgency  Suspected  Covid-19 Virus Infection     Discharge Instructions     Part to stay hydrated: Drink plenty of water throughout the day and avoid soda, tea, juice as this can further dehydrate you. Take Tylenol as needed for muscle aches and fever. Stay home until you know the results of your COVID test. Return for evaluation should you develop cough, difficulty breathing, wheezing    ED Prescriptions    None     Controlled Substance Prescriptions [place] Controlled Substance Registry consulted? Not Applicable   Quincy Sheehan, [place] 03/31/19 1723

## 2019-03-31 NOTE — Assessment & Plan Note (Signed)
She did get testing this morning already but really needs in person evaluation for her symptoms and clinical status. She is advised to go to ER or urgent care now.

## 2019-03-31 NOTE — Discharge Instructions (Addendum)
Part to stay hydrated: Drink plenty of water throughout the day and avoid soda, tea, juice as this can further dehydrate you. Take Tylenol as needed for muscle aches and fever. Stay home until you know the results of your COVID test. Return for evaluation should you develop cough, difficulty breathing, wheezing

## 2019-03-31 NOTE — ED Triage Notes (Signed)
Pt sts some lower abd pain with dysuria and urgency; pt sts some chills

## 2019-04-03 LAB — NOVEL CORONAVIRUS, NAA: SARS-CoV-2, NAA: NOT DETECTED

## 2019-05-28 ENCOUNTER — Ambulatory Visit
Admission: RE | Admit: 2019-05-28 | Discharge: 2019-05-28 | Disposition: A | Discharge status: Home / Self Care | Payer: 59 | Source: Ambulatory Visit | Attending: Internal Medicine | Admitting: Internal Medicine

## 2019-05-28 ENCOUNTER — Other Ambulatory Visit: Payer: Self-pay

## 2019-05-28 DIAGNOSIS — N63 Unspecified lump in unspecified breast: Secondary | ICD-10-CM

## 2019-10-17 ENCOUNTER — Other Ambulatory Visit: Payer: Self-pay | Admitting: Internal Medicine

## 2019-10-17 DIAGNOSIS — Z1231 Encounter for screening mammogram for malignant neoplasm of breast: Secondary | ICD-10-CM

## 2019-11-26 ENCOUNTER — Other Ambulatory Visit: Payer: Self-pay

## 2019-11-26 ENCOUNTER — Ambulatory Visit
Admission: RE | Admit: 2019-11-26 | Discharge: 2019-11-26 | Disposition: A | Discharge status: Home / Self Care | Payer: 59 | Source: Ambulatory Visit | Attending: Internal Medicine | Admitting: Internal Medicine

## 2019-11-26 DIAGNOSIS — Z1231 Encounter for screening mammogram for malignant neoplasm of breast: Secondary | ICD-10-CM

## 2019-12-13 ENCOUNTER — Ambulatory Visit: Payer: 59 | Attending: Internal Medicine

## 2019-12-13 DIAGNOSIS — Z23 Encounter for immunization: Secondary | ICD-10-CM

## 2019-12-13 NOTE — Progress Notes (Signed)
   Covid-19 Vaccination Clinic  Name:  [name]    MRN: [id number] DOB: 08/27/1956  12/13/2019  [name] was observed post Covid-19 immunization for 15 minutes without incident. She was provided with Vaccine Information Sheet and instruction to access the V-Safe system.   [name] was instructed to call 911 with any severe reactions post vaccine: Marland Kitchen Difficulty breathing  . Swelling of face and throat  . A fast heartbeat  . A bad rash all over body  . Dizziness and weakness   Immunizations Administered    Name Date Dose VIS Date Route   Moderna COVID-19 Vaccine 12/13/2019 12:16 PM 0.5 mL 09/02/2019 Intramuscular   Manufacturer: Moderna   Lot: [id number]   [id number]

## 2020-01-14 ENCOUNTER — Ambulatory Visit: Payer: 59 | Attending: Internal Medicine

## 2020-01-14 ENCOUNTER — Ambulatory Visit: Payer: 59

## 2020-01-14 DIAGNOSIS — Z23 Encounter for immunization: Secondary | ICD-10-CM

## 2020-01-14 NOTE — Progress Notes (Signed)
   Covid-19 Vaccination Clinic  Name:  [name]    MRN: [id number] DOB: 1956-01-03  01/14/2020  [name] was observed post Covid-19 immunization for 15 minutes without incident. She was provided with Vaccine Information Sheet and instruction to access the V-Safe system.   [name] was instructed to call 911 with any severe reactions post vaccine: Marland Kitchen Difficulty breathing  . Swelling of face and throat  . A fast heartbeat  . A bad rash all over body  . Dizziness and weakness   Immunizations Administered    Name Date Dose VIS Date Route   Moderna COVID-19 Vaccine 01/14/2020 12:56 PM 0.5 mL 09/02/2019 Intramuscular   Manufacturer: Moderna   Lot: [id number]   [id number]

## 2020-02-05 ENCOUNTER — Encounter: Payer: Self-pay | Admitting: Internal Medicine

## 2020-02-05 ENCOUNTER — Other Ambulatory Visit: Payer: Self-pay

## 2020-02-05 ENCOUNTER — Ambulatory Visit: Payer: 59 | Admitting: Internal Medicine

## 2020-02-05 VITALS — BP 134/86 | HR 76 | Temp 98.8°F | Ht 66.0 in | Wt 215.8 lb

## 2020-02-05 DIAGNOSIS — Z23 Encounter for immunization: Secondary | ICD-10-CM | POA: Diagnosis not present

## 2020-02-05 DIAGNOSIS — I872 Venous insufficiency (chronic) (peripheral): Secondary | ICD-10-CM

## 2020-02-05 DIAGNOSIS — I10 Essential (primary) hypertension: Secondary | ICD-10-CM | POA: Diagnosis not present

## 2020-02-05 DIAGNOSIS — E119 Type 2 diabetes mellitus without complications: Secondary | ICD-10-CM | POA: Diagnosis not present

## 2020-02-05 DIAGNOSIS — R21 Rash and other nonspecific skin eruption: Secondary | ICD-10-CM

## 2020-02-05 DIAGNOSIS — M722 Plantar fascial fibromatosis: Secondary | ICD-10-CM

## 2020-02-05 LAB — COMPREHENSIVE METABOLIC PANEL
ALT: 14 U/L (ref 0–35)
AST: 18 U/L (ref 0–37)
Albumin: 4.6 g/dL (ref 3.5–5.2)
Alkaline Phosphatase: 78 U/L (ref 39–117)
BUN: 16 mg/dL (ref 6–23)
CO2: 30 mEq/L (ref 19–32)
Calcium: 10.5 mg/dL (ref 8.4–10.5)
Chloride: 100 mEq/L (ref 96–112)
Creatinine, Ser: 1.2 mg/dL (ref 0.40–1.20)
GFR: 54.71 mL/min — ABNORMAL LOW (ref >60.00)
Glucose, Bld: 148 mg/dL — ABNORMAL HIGH (ref 70–99)
Potassium: 4.7 mEq/L (ref 3.5–5.1)
Sodium: 132 mEq/L — ABNORMAL LOW (ref 135–145)
Total Bilirubin: 0.5 mg/dL (ref 0.2–1.2)
Total Protein: 8.5 g/dL — ABNORMAL HIGH (ref 6.0–8.3)

## 2020-02-05 LAB — LIPID PANEL
Cholesterol: 171 mg/dL (ref 0–200)
HDL: 45.8 mg/dL (ref >39.00)
LDL Cholesterol: 92 mg/dL (ref 0–99)
NonHDL: 125.64
Total CHOL/HDL Ratio: 4
Triglycerides: 169 mg/dL — ABNORMAL HIGH (ref 0.0–149.0)
VLDL: 33.8 mg/dL (ref 0.0–40.0)

## 2020-02-05 LAB — CBC
HCT: 43.3 % (ref 36.0–46.0)
Hemoglobin: 13.9 g/dL (ref 12.0–15.0)
MCHC: 32 g/dL (ref 30.0–36.0)
MCV: 86.2 fl (ref 78.0–100.0)
Platelets: 403 10*3/uL — ABNORMAL HIGH (ref 150.0–400.0)
RBC: 5.02 Mil/uL (ref 3.87–5.11)
RDW: 13.5 % (ref 11.5–15.5)
WBC: 6.5 10*3/uL (ref 4.0–10.5)

## 2020-02-05 LAB — MICROALBUMIN / CREATININE URINE RATIO
Creatinine,U: 166.1 mg/dL
Microalb Creat Ratio: 1.5 mg/g (ref 0.0–30.0)
Microalb, Ur: 2.5 mg/dL — ABNORMAL HIGH (ref 0.0–1.9)

## 2020-02-05 LAB — HEMOGLOBIN A1C: Hgb A1c MFr Bld: 8.2 % — ABNORMAL HIGH (ref 4.6–6.5)

## 2020-02-05 MED ORDER — LOSARTAN POTASSIUM 50 MG PO TABS
50.0000 mg | ORAL_TABLET | Freq: Every day | ORAL | 3 refills | Status: DC
Start: 2020-02-05 — End: 2020-11-03

## 2020-02-05 MED ORDER — SPIRONOLACTONE 25 MG PO TABS: 25.0000 mg | ORAL_TABLET | Freq: Every day | ORAL | 3 refills | Status: DC

## 2020-02-05 MED ORDER — NYSTATIN-TRIAMCINOLONE 100000-0.1 UNIT/GM-% EX OINT
1.0000 | TOPICAL_OINTMENT | Freq: Two times a day (BID) | CUTANEOUS | 2 refills | Status: DC
Start: 2020-02-05 — End: 2024-01-22

## 2020-02-05 NOTE — Patient Instructions (Signed)
We have sent in losartan to take instead of the amlodipine.   We have sent in a cream to use on the breast area.    Plantar Fasciitis Rehab Ask your health care provider which exercises are safe for you. Do exercises exactly as told by your health care provider and adjust them as directed. It is normal to feel mild stretching, pulling, tightness, or discomfort as you do these exercises. Stop right away if you feel sudden pain or your pain gets worse. Do not begin these exercises until told by your health care provider. Stretching and range-of-motion exercises These exercises warm up your muscles and joints and improve the movement and flexibility of your foot. These exercises also help to relieve pain. Plantar fascia stretch  1. Sit with your left / right leg crossed over your opposite knee. 2. Hold your heel with one hand with that thumb near your arch. With your other hand, hold your toes and gently pull them back toward the top of your foot. You should feel a stretch on the bottom of your toes or your foot (plantar fascia) or both. 3. Hold this stretch for__________ seconds. 4. Slowly release your toes and return to the starting position. Repeat __________ times. Complete this exercise __________ times a day. Gastrocnemius stretch, standing This exercise is also called a calf (gastroc) stretch. It stretches the muscles in the back of the upper calf. 1. Stand with your hands against a wall. 2. Extend your left / right leg behind you, and bend your front knee slightly. 3. Keeping your heels on the floor and your back knee straight, shift your weight toward the wall. Do not arch your back. You should feel a gentle stretch in your upper left / right calf. 4. Hold this position for __________ seconds. Repeat __________ times. Complete this exercise __________ times a day. Soleus stretch, standing This exercise is also called a calf (soleus) stretch. It stretches the muscles in the back of the  lower calf. 1. Stand with your hands against a wall. 2. Extend your left / right leg behind you, and bend your front knee slightly. 3. Keeping your heels on the floor, bend your back knee and shift your weight slightly over your back leg. You should feel a gentle stretch deep in your lower calf. 4. Hold this position for __________ seconds. Repeat __________ times. Complete this exercise __________ times a day. Gastroc and soleus stretch, standing step This exercise stretches the muscles in the back of the lower leg. These muscles are in the upper calf (gastrocnemius) and the lower calf (soleus). 1. Stand with the ball of your left / right foot on a step. The ball of your foot is on the walking surface, right under your toes. 2. Keep your other foot firmly on the same step. 3. Hold on to the wall or a railing for balance. 4. Slowly lift your other foot, allowing your body weight to press your left / right heel down over the edge of the step. You should feel a stretch in your left / right calf. 5. Hold this position for __________ seconds. 6. Return both feet to the step. 7. Repeat this exercise with a slight bend in your left / right knee. Repeat __________ times with your left / right knee straight and __________ times with your left / right knee bent. Complete this exercise __________ times a day. Balance exercise This exercise builds your balance and strength control of your arch to help take pressure  off your plantar fascia. Single leg stand If this exercise is too easy, you can try it with your eyes closed or while standing on a pillow. 1. Without shoes, stand near a railing or in a doorway. You may hold on to the railing or door frame as needed. 2. Stand on your left / right foot. Keep your big toe down on the floor and try to keep your arch lifted. Do not let your foot roll inward. 3. Hold this position for __________ seconds. Repeat __________ times. Complete this exercise __________  times a day. This information is not intended to replace advice given to you by your health care provider. Make sure you discuss any questions you have with your health care provider. Document Revised: 01/09/2019 Document Reviewed: 07/17/2018 Elsevier Patient Education  [place].

## 2020-02-05 NOTE — Progress Notes (Signed)
   Subjective:   Patient ID: [name], female    DOB: November 14, 1955, 64 y.o.   MRN: [id number]  HPI The patient is a 64 YO female coming in for several concerns including right foot pain (feels like walking on marbles in the morning and with extended rest time, works itself out with walking, more toward the back of the foot, certain shoes do put pressure on the midfoot which hurts sometimes throughout the day) and swelling in feet/ankles (going on for some time but worse in the last few weeks, does not matter what she eats, worse at the end of the day, sometimes feet are so swollen shoes do not fit right, denies change to diet or activity) and rash on left breast (has a keloid on that location but dark itchy rash surrounding this, denies spread, denies trying anything for it, showed up several months ago) and needs follow up diabetes (not on meds currently, last HgA1c about 1 year ago, 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   Review of Systems  Constitutional: Negative.   HENT: Negative.   Eyes: Negative.   Respiratory: Negative for cough, chest tightness and shortness of breath.   Cardiovascular: Positive for leg swelling. Negative for chest pain and palpitations.  Gastrointestinal: Negative for abdominal distention, abdominal pain, constipation, diarrhea, nausea and vomiting.  Musculoskeletal: Positive for arthralgias and myalgias.  Skin: Positive for color change and rash.  Neurological: Negative.   Psychiatric/Behavioral: Negative.     Objective:  Physical Exam Constitutional:      Appearance: She is well-developed.  HENT:     Head: Normocephalic and atraumatic.  Cardiovascular:     Rate and Rhythm: Normal rate and regular rhythm.  Pulmonary:     Effort: Pulmonary effort is normal. No respiratory distress.     Breath sounds: Normal breath sounds. No wheezing or rales.  Abdominal:     General: Bowel sounds are normal. There is no distension.     Palpations: Abdomen is  soft.     Tenderness: There is no abdominal tenderness. There is no rebound.  Musculoskeletal:        General: Swelling and tenderness present.     Cervical back: Normal range of motion.     Comments: Tenderness right heel, minimal swelling to the feet and ankles pitting 1+ bilateral  Skin:    General: Skin is warm and dry.     Findings: Rash present.     Comments: Rash surrounding keloid on the left breast with itching, no surrounding cellulitis  Neurological:     Mental Status: She is alert and oriented to person, place, and time.     Coordination: Coordination normal.     Vitals:   02/05/20 1458  BP: 134/86  Pulse: 76  Temp: 98.8 F (37.1 C)  SpO2: 100%  Weight: 215 lb 12.8 oz (97.9 kg)  Height: 5\' 6"  (1.676 m)    This visit occurred during the SARS-CoV-2 public health emergency.  Safety protocols were in place, including screening questions prior to the visit, additional usage of staff PPE, and extensive cleaning of exam room while observing appropriate contact time as indicated for disinfecting solutions.   Assessment & Plan:  Tdap given at visit

## 2020-02-06 DIAGNOSIS — M722 Plantar fascial fibromatosis: Secondary | ICD-10-CM | POA: Insufficient documentation

## 2020-02-06 DIAGNOSIS — R21 Rash and other nonspecific skin eruption: Secondary | ICD-10-CM | POA: Insufficient documentation

## 2020-02-06 NOTE — Assessment & Plan Note (Signed)
Could be exacerbated by amlodipine so will switch this medication.

## 2020-02-06 NOTE — Assessment & Plan Note (Signed)
Rx mycolog ointment to use on the breast.

## 2020-02-06 NOTE — Assessment & Plan Note (Signed)
Change amlodipine to losartan 50 mg daily due to swelling in the feet and ankles which is worsening. Keep spironolactone and will need follow up with labs in about 1-2 months.

## 2020-02-06 NOTE — Assessment & Plan Note (Signed)
Checking HgA1c and adjust as needed. Diet controlled currently.  ?

## 2020-02-06 NOTE — Assessment & Plan Note (Signed)
Given exercises for the right foot and encouraged to have supportive shoes.

## 2020-02-10 ENCOUNTER — Encounter: Payer: Self-pay | Admitting: Internal Medicine

## 2020-02-11 MED ORDER — JARDIANCE 10 MG PO TABS: 10.0000 mg | ORAL_TABLET | Freq: Every day | ORAL | 6 refills | Status: DC

## 2020-06-25 IMAGING — MG DIGITAL DIAGNOSTIC UNILATERAL LEFT MAMMOGRAM WITH TOMO AND CAD
4 series · 4 of 12 positions shown · non-contrast
Comparison: Previous exam(s).

CLINICAL DATA: 63-year-old female for six-month follow-up of LEFT
breast mass.

EXAM:
DIGITAL DIAGNOSTIC LEFT MAMMOGRAM WITH CAD AND TOMO
ULTRASOUND LEFT BREAST

[L CC synth-2D]
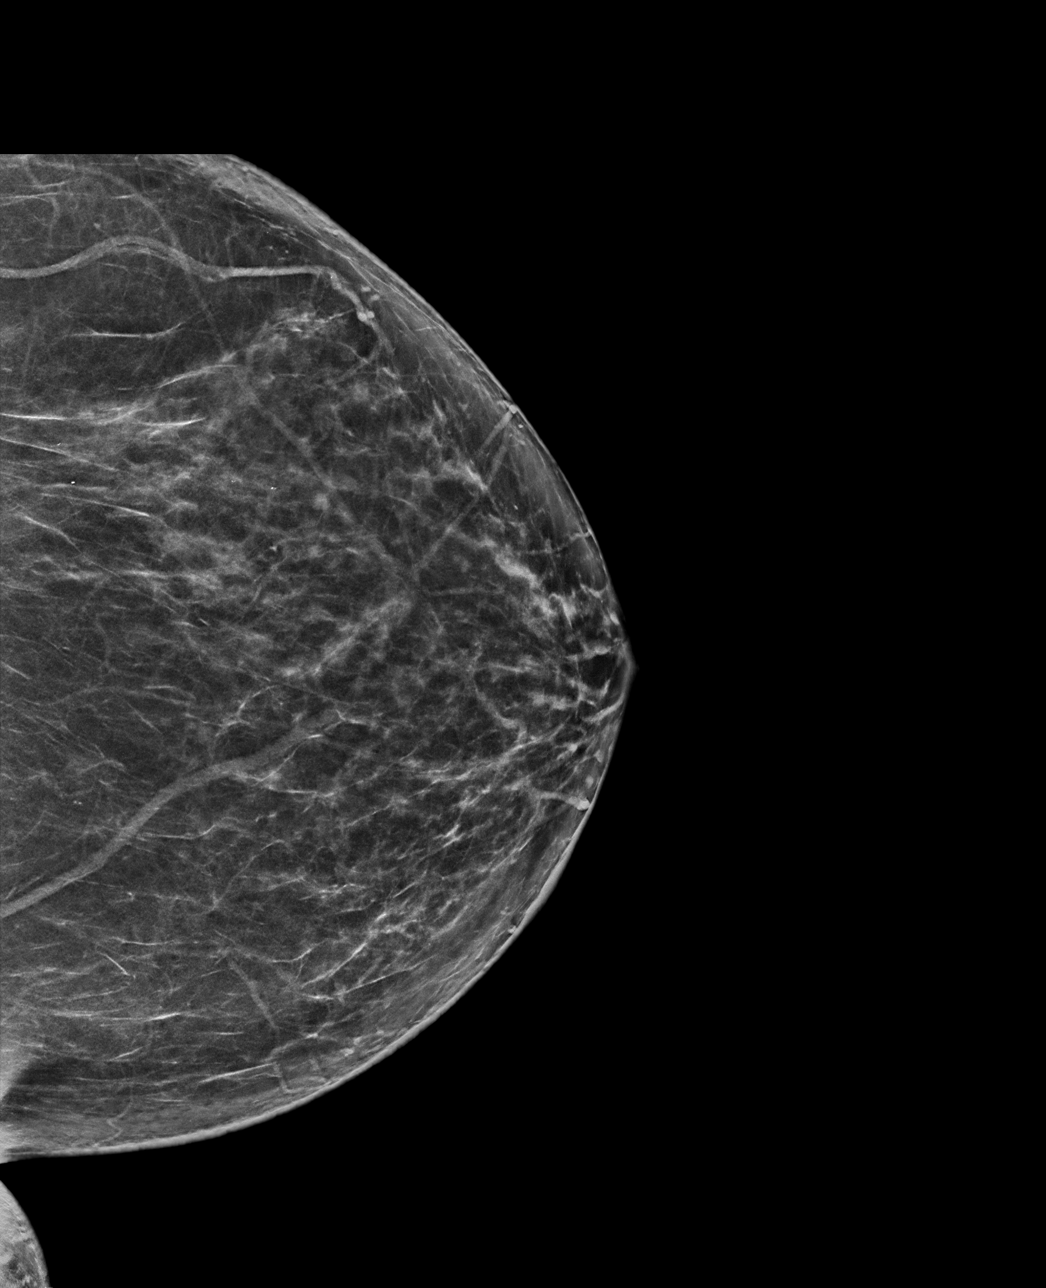

[L MLO synth-2D]
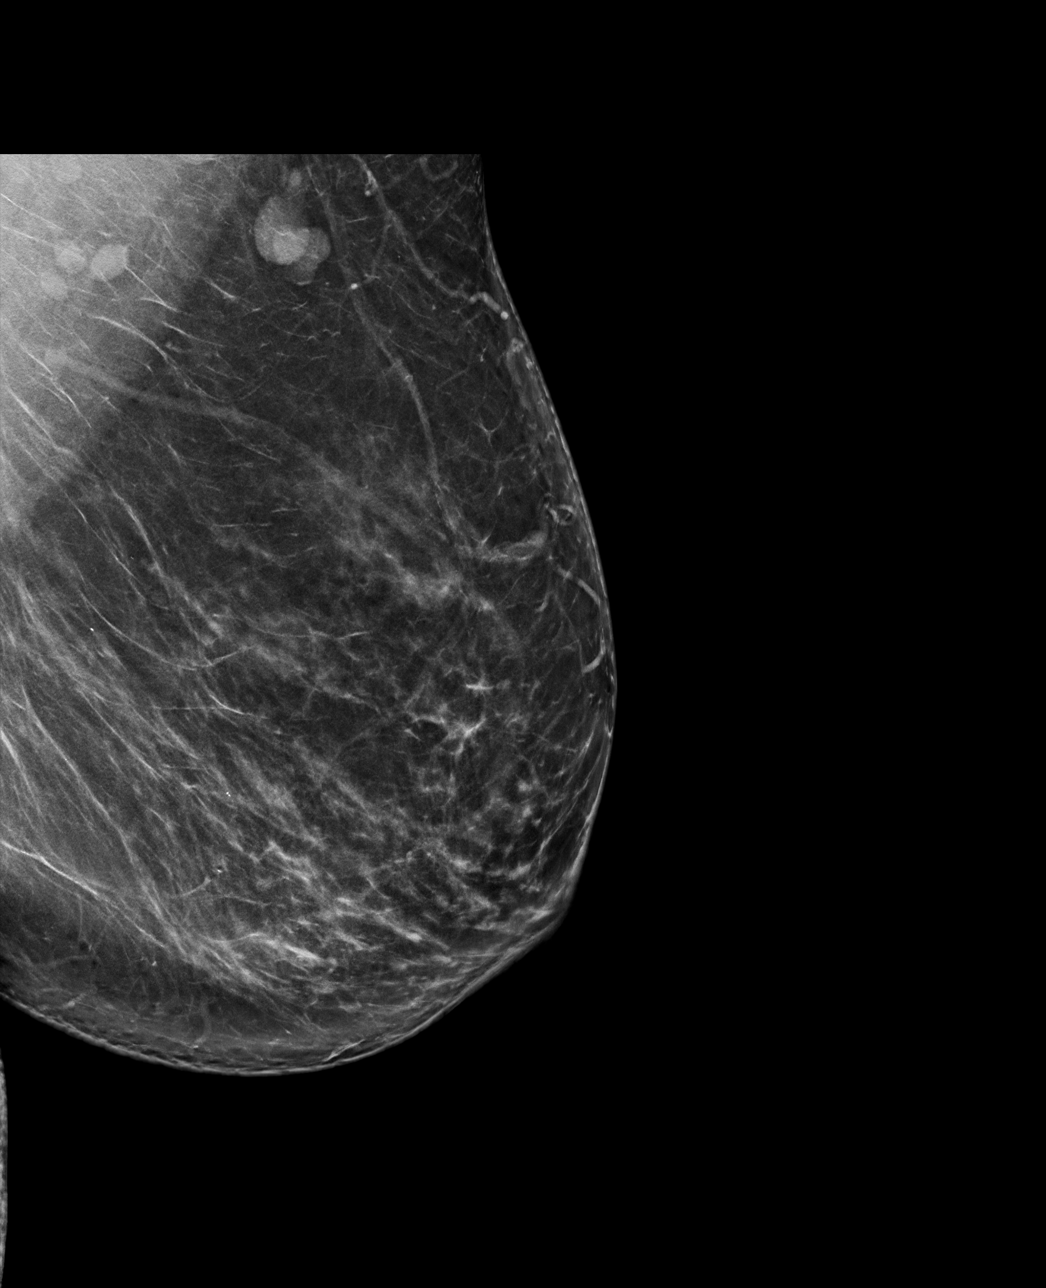

[L CC tomo · tomo slice 39/77.0]
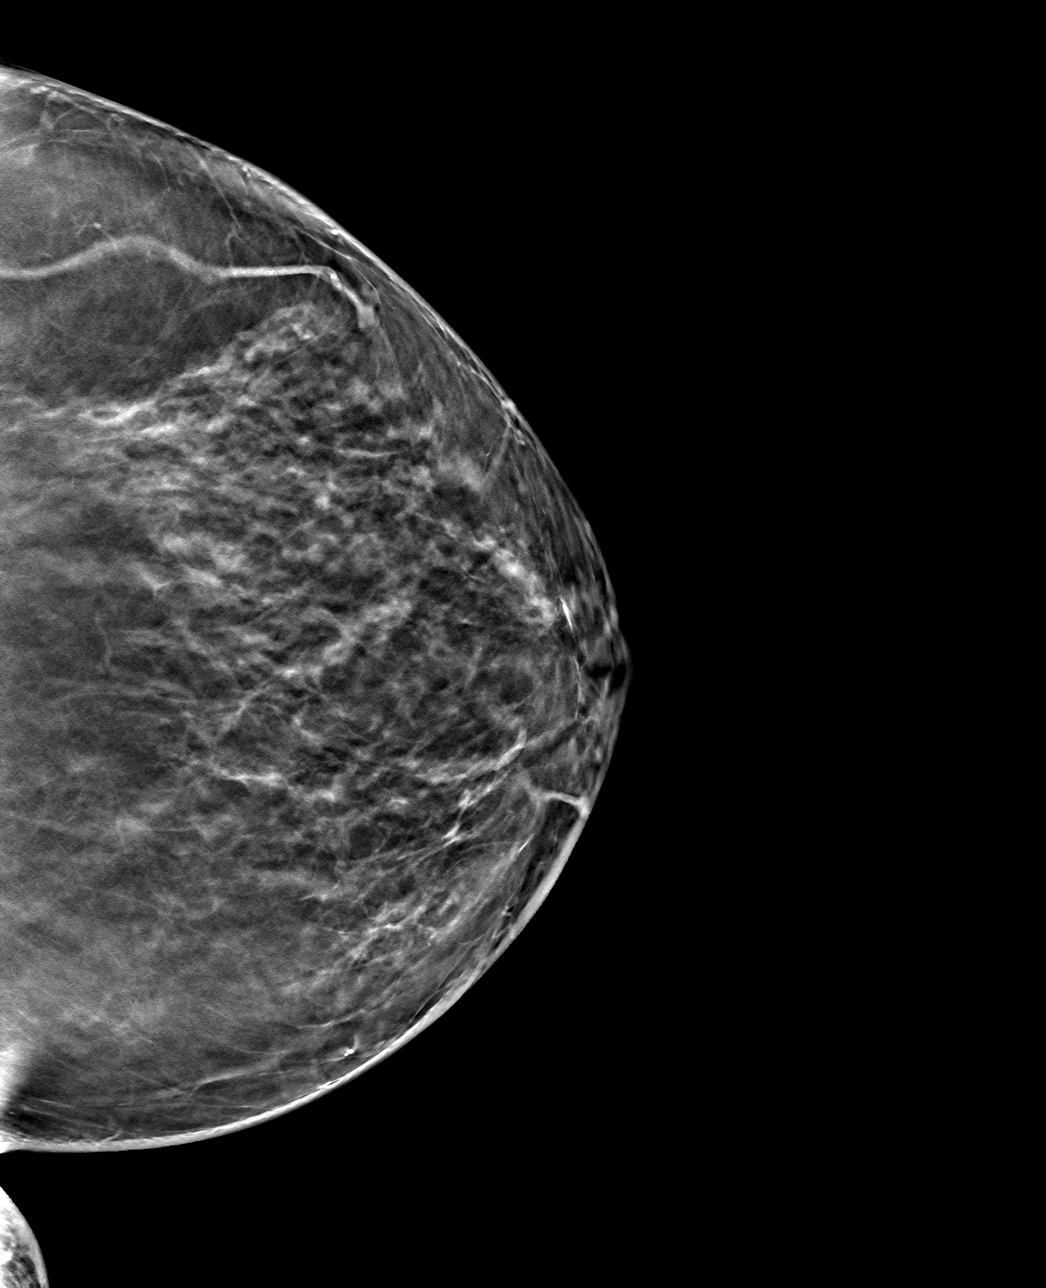

[L MLO tomo · tomo slice 45/90.0]
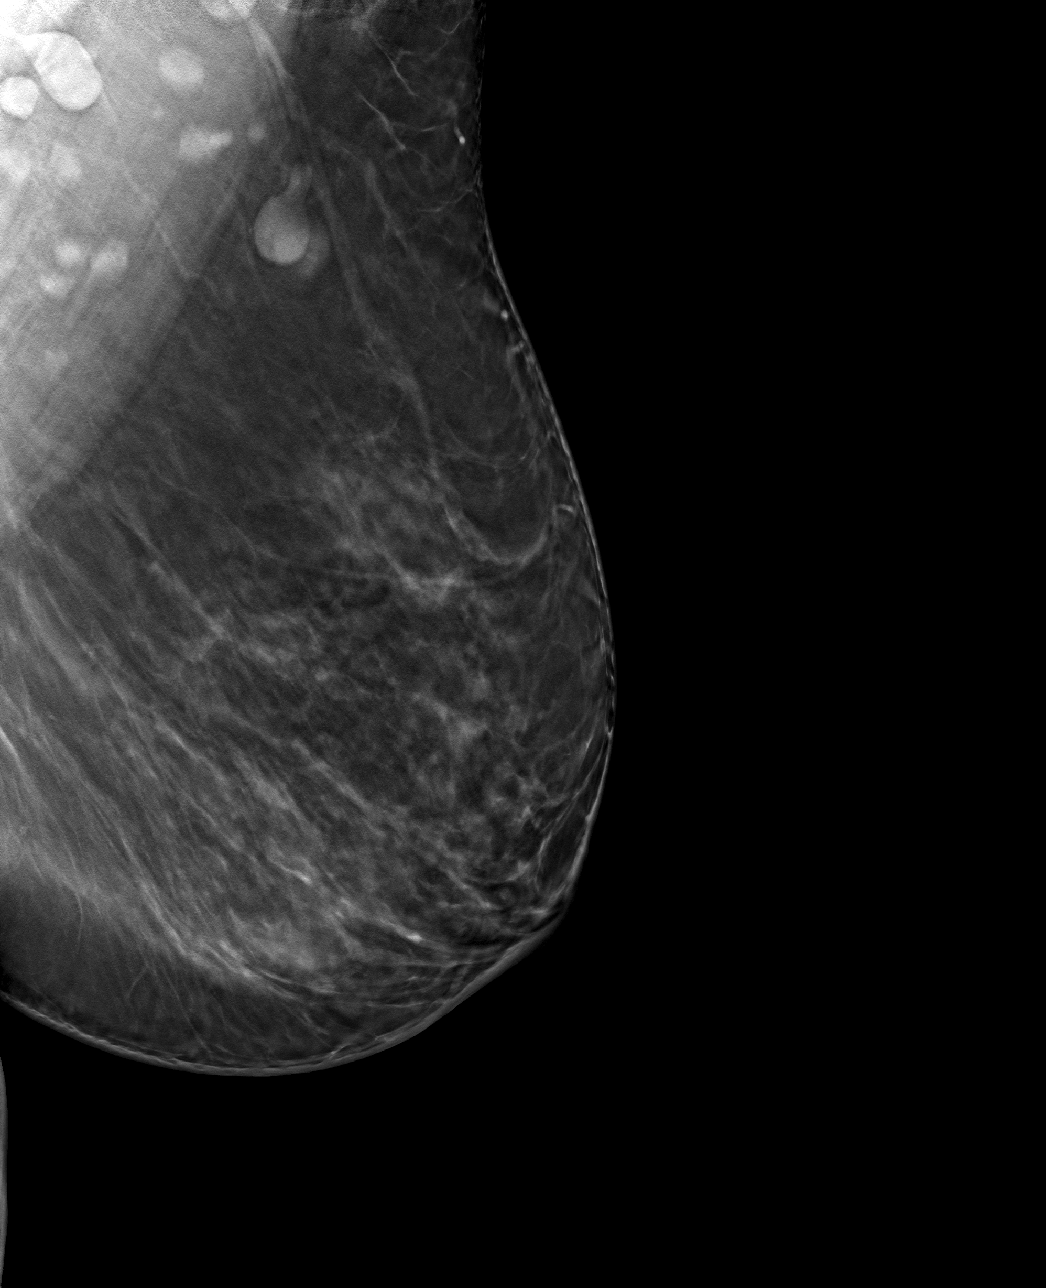

[4 of 12 positions shown; findings below may reference images not displayed]

ACR Breast Density Category b: There are scattered areas of
fibroglandular density.
FINDINGS: 2D/3D full field views of the LEFT breast demonstrate no suspicious
mass, distortion or worrisome calcifications. The previously
identified UPPER LEFT breast mass is no longer visualized.

Mammographic images were processed with CAD.

Targeted ultrasound is performed, showing no solid or cystic mass,
distortion or abnormal shadowing within the UPPER LEFT breast.
IMPRESSION: 1. Interval resolution of LEFT breast mass, compatible with a benign
process.
2. No mammographic evidence of LEFT breast malignancy.

RECOMMENDATION:
Bilateral screening mammogram in 6 months to resume annual mammogram
schedule.

I have discussed the findings and recommendations with the patient.
If applicable, a reminder letter will be sent to the patient
regarding the next appointment.

BI-RADS CATEGORY  1: Negative.

## 2020-06-25 IMAGING — US ULTRASOUND LEFT BREAST LIMITED
1 series · 4 of 4 positions shown · non-contrast
Comparison: Previous exam(s).

CLINICAL DATA: 63-year-old female for six-month follow-up of LEFT
breast mass.

EXAM:
DIGITAL DIAGNOSTIC LEFT MAMMOGRAM WITH CAD AND TOMO
ULTRASOUND LEFT BREAST

[Series 1: ultrasound left breast limited · 0.07mm/px · 4 of 4 slices shown]
[im 1/4]
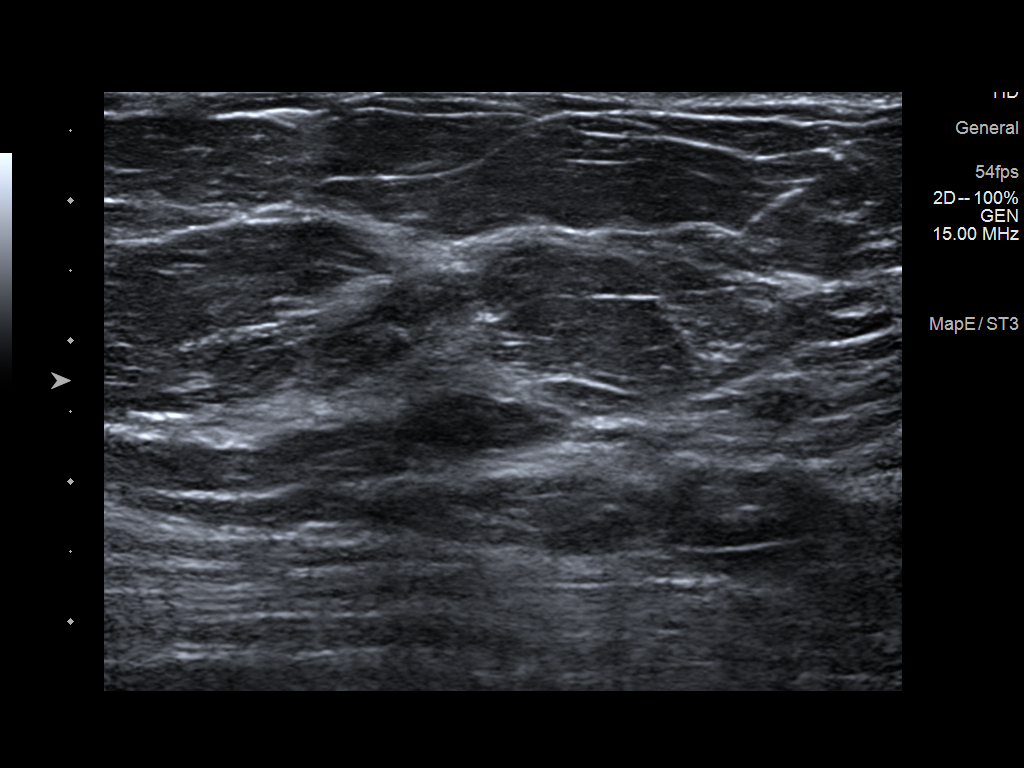
[im 2/4]
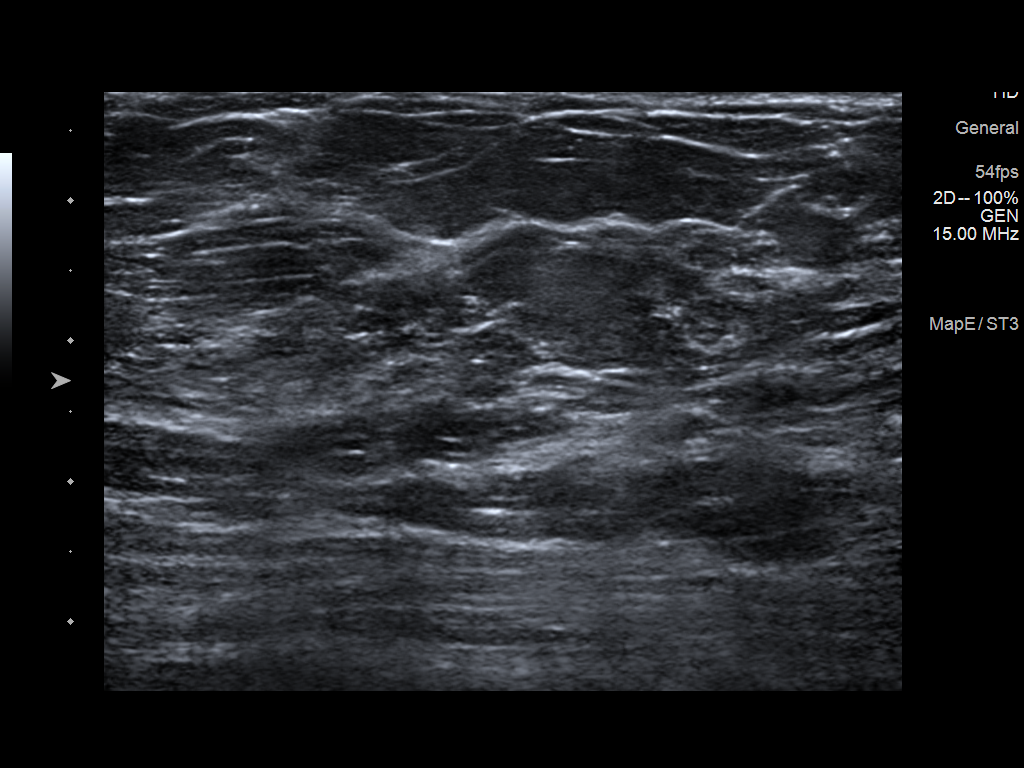
[im 3/4]
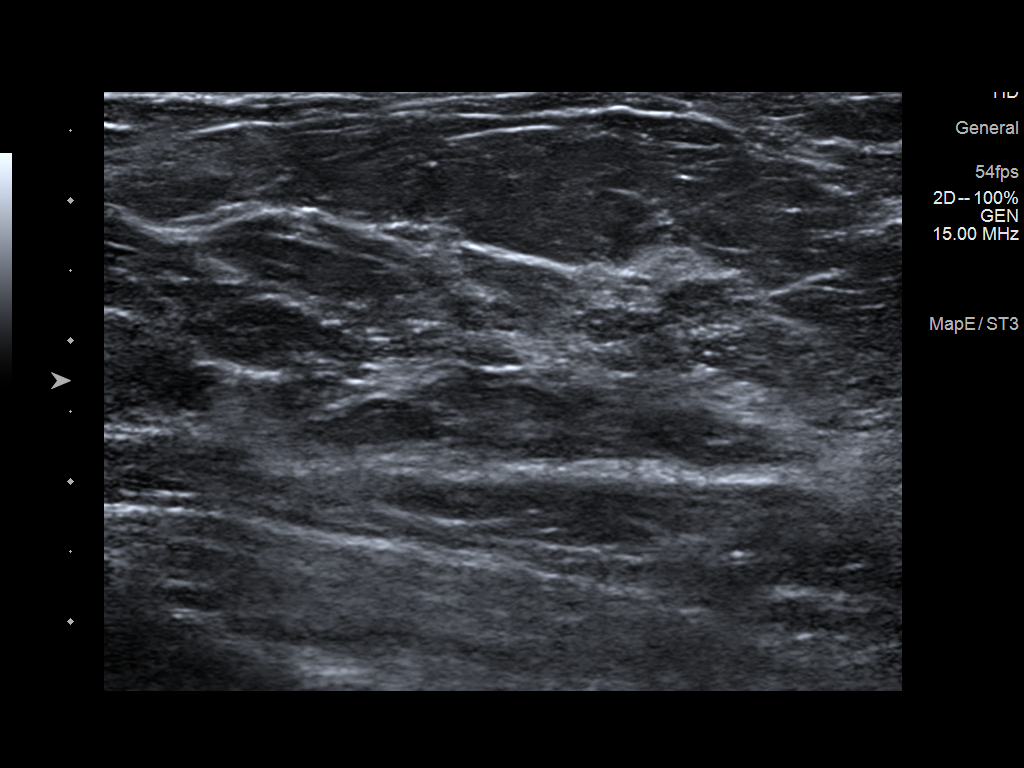
[im 4/4]
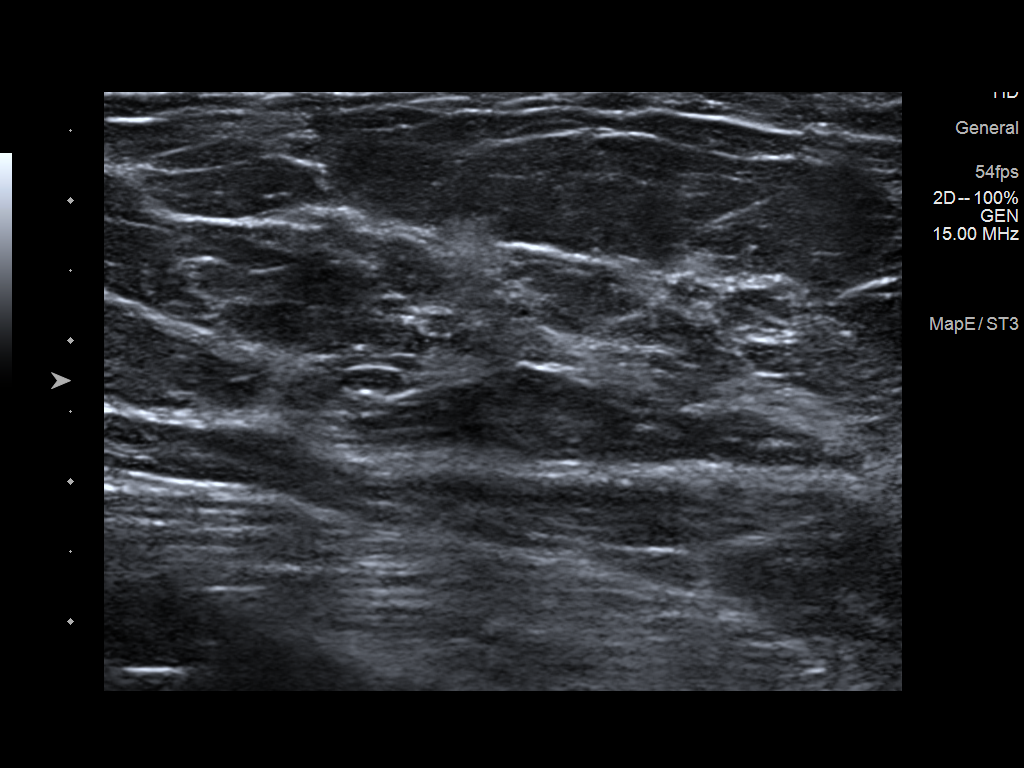

[4 of 4 positions shown; findings below may reference images not displayed]

ACR Breast Density Category b: There are scattered areas of
fibroglandular density.
FINDINGS: 2D/3D full field views of the LEFT breast demonstrate no suspicious
mass, distortion or worrisome calcifications. The previously
identified UPPER LEFT breast mass is no longer visualized.

Mammographic images were processed with CAD.

Targeted ultrasound is performed, showing no solid or cystic mass,
distortion or abnormal shadowing within the UPPER LEFT breast.
IMPRESSION: 1. Interval resolution of LEFT breast mass, compatible with a benign
process.
2. No mammographic evidence of LEFT breast malignancy.

RECOMMENDATION:
Bilateral screening mammogram in 6 months to resume annual mammogram
schedule.

I have discussed the findings and recommendations with the patient.
If applicable, a reminder letter will be sent to the patient
regarding the next appointment.

BI-RADS CATEGORY  1: Negative.

## 2020-09-04 ENCOUNTER — Other Ambulatory Visit: Payer: Self-pay | Admitting: Internal Medicine

## 2020-09-26 ENCOUNTER — Other Ambulatory Visit: Payer: Self-pay

## 2020-09-26 ENCOUNTER — Ambulatory Visit
Admission: EM | Admit: 2020-09-26 | Discharge: 2020-09-26 | Disposition: A | Discharge status: Home / Self Care | Payer: 59 | Attending: Family Medicine | Admitting: Family Medicine

## 2020-09-26 DIAGNOSIS — R519 Headache, unspecified: Secondary | ICD-10-CM

## 2020-09-26 DIAGNOSIS — R067 Sneezing: Secondary | ICD-10-CM

## 2020-09-26 DIAGNOSIS — B349 Viral infection, unspecified: Secondary | ICD-10-CM

## 2020-09-26 DIAGNOSIS — Z1152 Encounter for screening for COVID-19: Secondary | ICD-10-CM

## 2020-09-26 DIAGNOSIS — J3489 Other specified disorders of nose and nasal sinuses: Secondary | ICD-10-CM

## 2020-09-26 NOTE — Discharge Instructions (Addendum)
May use plain mucinex for decongestant  Your COVID and Flu tests are pending.  You should self quarantine until the test results are back.    Take Tylenol or ibuprofen as needed for fever or discomfort.  Rest and keep yourself hydrated.    Follow-up with your primary care provider if your symptoms are not improving.

## 2020-09-26 NOTE — ED Triage Notes (Signed)
Pt presents with nasal congestion for past 3 days

## 2020-09-26 NOTE — ED Provider Notes (Signed)
[place]   [id number] 09/26/20 Arrival Time: 1009   CC: COVID symptoms  SUBJECTIVE: History from: patient.  [name] is a 64 y.o. female who presents with abrupt onset of nasal congestion, fatigue, PND, and persistent dry cough for the last 3 days. Denies sick exposure to COVID, flu or strep. Denies recent travel. Has negative history of Covid. Has completed Covid vaccines. Has taken OTC cough and cold with little relief. There are no aggravating or alleviating factors. Denies previous symptoms in the past. Denies fever, chills, sinus pain, rhinorrhea, sore throat, SOB, wheezing, chest pain, nausea, changes in bowel or bladder habits.    ROS: As per HPI.  All other pertinent ROS negative.     Past Medical History:  Diagnosis Date  . Breast cancer [facility]) 2011   right breast with axillary lymph node removal  . Diabetes mellitus, type II ([place])    type 2  . GERD (gastroesophageal reflux disease)   . Hypertension   . Osteoarthritis of both knees   . Personal history of chemotherapy   . Personal history of radiation therapy   . Pneumonia    in early 1980's   Past Surgical History:  Procedure Laterality Date  . ABDOMINAL HYSTERECTOMY    . APPENDECTOMY  1978  . BLADDER SUSPENSION    . BREAST LUMPECTOMY Right 2011  . JOINT REPLACEMENT     bil knees  . KNEE ARTHROPLASTY Left 06/29/2016   Procedure: LEFT TOTAL KNEE WITH NAVIGATION;  Surgeon: Rod Can, MD;  Location: [facility];  Service: Orthopedics;  Laterality: Left;  . KNEE ARTHROPLASTY Right 12/14/2016   Procedure: RIGHT TOTAL KNEE ARTHROPLASTY WITH COMPUTER NAVIGATION;  Surgeon: Rod Can, MD;  Location: [facility];  Service: Orthopedics;  Laterality: Right;  Needs RNFA  . LUMBAR LAMINECTOMY/DECOMPRESSION MICRODISCECTOMY N/A 08/23/2018   Procedure: L3-4 DECOMPRESSION, REMOVAL OF INTRASPINAL EXTRADURAL FACET CYST;  Surgeon: Marybelle Killings, MD;  Location: [place];  Service: Orthopedics;  Laterality: N/A;  .  MENISCUS REPAIR     right knee  . OOPHORECTOMY     No Known Allergies No current facility-administered medications on file prior to encounter.   Current Outpatient Medications on File Prior to Encounter  Medication Sig Dispense Refill  . JARDIANCE 10 MG TABS tablet TAKE 1 TABLET BY MOUTH DAILY BEFORE BREAKFAST 30 tablet 6  . losartan (COZAAR) 50 MG tablet Take 1 tablet (50 mg total) by mouth daily. 90 tablet 3  . MAGNESIUM PO Take 500 mg by mouth daily.     Marland Kitchen nystatin-triamcinolone ointment (MYCOLOG) Apply 1 application topically 2 (two) times daily. 100 g 2  . spironolactone (ALDACTONE) 25 MG tablet Take 1 tablet (25 mg total) by mouth daily. 90 tablet 3  . [DISCONTINUED] Olmesartan-Amlodipine-HCTZ (TRIBENZOR) 40-5-25 MG TABS Take 1 tablet by mouth daily.    . [DISCONTINUED] potassium chloride 20 MEQ TBCR Take 20 mEq by mouth as needed. 30 tablet 6   Social History   Socioeconomic History  . Marital status: Married    Spouse name: Not on file  . Number of children: 3  . Years of education: Not on file  . Highest education level: Not on file  Occupational History  . Occupation: medical records    Employer: [employer]  Tobacco Use  . Smoking status: Former Smoker    Packs/day: 0.50    Years: 20.00    Pack years: 10.00    Types: Cigarettes    Quit date: 10/14/2013  Years since quitting: 6.9  . Smokeless tobacco: Never Used  Substance and Sexual Activity  . Alcohol use: Yes    Comment: rarely  . Drug use: No  . Sexual activity: Not on file  Other Topics Concern  . Not on file  Social History Narrative  . Not on file   Social Determinants of Health   Financial Resource Strain: Not on file  Food Insecurity: Not on file  Transportation Needs: Not on file  Physical Activity: Not on file  Stress: Not on file  Social Connections: Not on file  Intimate Partner Violence: Not on file   Family History  Adopted: Yes  Problem Relation Age of Onset  . Breast cancer  Maternal Aunt     OBJECTIVE:  Vitals:   09/26/20 1132  BP: 127/79  Pulse: 63  Resp: 18  Temp: 98.1 F (36.7 C)  TempSrc: Oral  SpO2: 97%     General appearance: alert; appears fatigued, but nontoxic; speaking in full sentences and tolerating own secretions HEENT: NCAT; Ears: EACs clear, TMs pearly gray; Eyes: PERRL.  EOM grossly intact. Sinuses: nontender; Nose: nares patent without rhinorrhea, Throat: oropharynx mildly erythematous, cobblestoning present, tonsils non erythematous or enlarged, uvula midline  Neck: supple without LAD Lungs: unlabored respirations, symmetrical air entry; cough: absent; no respiratory distress; CTAB Heart: regular rate and rhythm.  Radial pulses 2+ symmetrical bilaterally Skin: warm and dry Psychological: alert and cooperative; normal mood and affect  LABS:  No results found for this or any previous visit (from the past 24 hour(s)).   ASSESSMENT & PLAN:  1. Viral illness   2. Encounter for screening for COVID-19   3. Rhinorrhea   4. Sneezing   5. Nonintractable headache, unspecified chronicity pattern, unspecified headache type    Continue supportive care at home  COVID testing ordered.  It will take between 1-2 days for test results.  Someone will contact you regarding abnormal results.    Patient should remain in quarantine until they have received Covid results.  If negative you may resume normal activities (go back to work/school) while practicing hand hygiene, social distance, and mask wearing.  If positive, patient should remain in quarantine for 10 days from symptom onset AND greater than 72 hours after symptoms resolution (absence of fever without the use of fever-reducing medication and improvement in respiratory symptoms), whichever is longer Get plenty of rest and push fluids Use OTC zyrtec for nasal congestion, runny nose, and/or sore throat Use OTC flonase for nasal congestion and runny nose Use medications daily for symptom  relief Use OTC medications like ibuprofen or tylenol as needed fever or pain Call or go to the ED if you have any new or worsening symptoms such as fever, worsening cough, shortness of breath, chest tightness, chest pain, turning blue, changes in mental status.  Reviewed expectations re: course of current medical issues. Questions answered. Outlined signs and symptoms indicating need for more acute intervention. Patient verbalized understanding. After Visit Summary given.         Faustino Congress, NP 09/27/20 1241

## 2020-09-27 LAB — NOVEL CORONAVIRUS, NAA: SARS-CoV-2, NAA: NOT DETECTED

## 2020-09-27 LAB — SARS-COV-2, NAA 2 DAY TAT

## 2020-10-25 ENCOUNTER — Other Ambulatory Visit: Payer: Self-pay | Admitting: Internal Medicine

## 2020-10-25 ENCOUNTER — Other Ambulatory Visit: Payer: Self-pay

## 2020-10-25 DIAGNOSIS — Z1231 Encounter for screening mammogram for malignant neoplasm of breast: Secondary | ICD-10-CM

## 2020-10-26 ENCOUNTER — Encounter: Payer: Self-pay | Admitting: Internal Medicine

## 2020-10-26 ENCOUNTER — Ambulatory Visit: Payer: 59 | Admitting: Internal Medicine

## 2020-10-26 VITALS — BP 128/60 | HR 67 | Temp 98.1°F | Resp 18 | Ht 66.0 in | Wt 207.4 lb

## 2020-10-26 DIAGNOSIS — E119 Type 2 diabetes mellitus without complications: Secondary | ICD-10-CM

## 2020-10-26 DIAGNOSIS — I872 Venous insufficiency (chronic) (peripheral): Secondary | ICD-10-CM | POA: Diagnosis not present

## 2020-10-26 DIAGNOSIS — I10 Essential (primary) hypertension: Secondary | ICD-10-CM | POA: Diagnosis not present

## 2020-10-26 DIAGNOSIS — Z0000 Encounter for general adult medical examination without abnormal findings: Secondary | ICD-10-CM

## 2020-10-26 LAB — COMPREHENSIVE METABOLIC PANEL
ALT: 11 U/L (ref 0–35)
AST: 14 U/L (ref 0–37)
Albumin: 4.5 g/dL (ref 3.5–5.2)
Alkaline Phosphatase: 78 U/L (ref 39–117)
BUN: 15 mg/dL (ref 6–23)
CO2: 28 mEq/L (ref 19–32)
Calcium: 10.4 mg/dL (ref 8.4–10.5)
Chloride: 101 mEq/L (ref 96–112)
Creatinine, Ser: 1.21 mg/dL — ABNORMAL HIGH (ref 0.40–1.20)
GFR: 47.26 mL/min — ABNORMAL LOW (ref >60.00)
Glucose, Bld: 119 mg/dL — ABNORMAL HIGH (ref 70–99)
Potassium: 4.5 mEq/L (ref 3.5–5.1)
Sodium: 135 mEq/L (ref 135–145)
Total Bilirubin: 0.5 mg/dL (ref 0.2–1.2)
Total Protein: 8.3 g/dL (ref 6.0–8.3)

## 2020-10-26 LAB — CBC
HCT: 42.4 % (ref 36.0–46.0)
Hemoglobin: 13.7 g/dL (ref 12.0–15.0)
MCHC: 32.3 g/dL (ref 30.0–36.0)
MCV: 85.4 fl (ref 78.0–100.0)
Platelets: 421 10*3/uL — ABNORMAL HIGH (ref 150.0–400.0)
RBC: 4.97 Mil/uL (ref 3.87–5.11)
RDW: 14.1 % (ref 11.5–15.5)
WBC: 6.2 10*3/uL (ref 4.0–10.5)

## 2020-10-26 LAB — LIPID PANEL
Cholesterol: 168 mg/dL (ref 0–200)
HDL: 41.1 mg/dL (ref >39.00)
LDL Cholesterol: 100 mg/dL — ABNORMAL HIGH (ref 0–99)
NonHDL: 126.71
Total CHOL/HDL Ratio: 4
Triglycerides: 134 mg/dL (ref 0.0–149.0)
VLDL: 26.8 mg/dL (ref 0.0–40.0)

## 2020-10-26 LAB — HEMOGLOBIN A1C: Hgb A1c MFr Bld: 7.7 % — ABNORMAL HIGH (ref 4.6–6.5)

## 2020-10-26 NOTE — Progress Notes (Signed)
   Subjective:   Patient ID: [name], female    DOB: 1956-06-08, 65 y.o.   MRN: [id number]  HPI The patient is a 65 YO female coming in for physical.   PMH, [place], social history reviewed and updated  Review of Systems  Constitutional: Negative.   HENT: Negative.   Eyes: Negative.   Respiratory: Negative for cough, chest tightness and shortness of breath.   Cardiovascular: Negative for chest pain, palpitations and leg swelling.  Gastrointestinal: Negative for abdominal distention, abdominal pain, constipation, diarrhea, nausea and vomiting.  Musculoskeletal: Negative.   Skin: Negative.   Neurological: Negative.   Psychiatric/Behavioral: Negative.     Objective:  Physical Exam Constitutional:      Appearance: She is well-developed and well-nourished.  HENT:     Head: Normocephalic and atraumatic.  Eyes:     Extraocular Movements: EOM normal.  Cardiovascular:     Rate and Rhythm: Normal rate and regular rhythm.  Pulmonary:     Effort: Pulmonary effort is normal. No respiratory distress.     Breath sounds: Normal breath sounds. No wheezing or rales.  Abdominal:     General: Bowel sounds are normal. There is no distension.     Palpations: Abdomen is soft.     Tenderness: There is no abdominal tenderness. There is no rebound.  Musculoskeletal:        General: No edema.     Cervical back: Normal range of motion.  Skin:    General: Skin is warm and dry.  Neurological:     Mental Status: She is alert and oriented to person, place, and time.     Coordination: Coordination normal.  Psychiatric:        Mood and Affect: Mood and affect normal.     Vitals:   10/26/20 1530  BP: 128/60  Pulse: 67  Resp: 18  Temp: 98.1 F (36.7 C)  TempSrc: Oral  SpO2: 97%  Weight: 207 lb 6.4 oz (94.1 kg)  Height: 5\' 6"  (1.676 m)    This visit occurred during the SARS-CoV-2 public health emergency.  Safety protocols were in place, including screening questions prior to the  visit, additional usage of staff PPE, and extensive cleaning of exam room while observing appropriate contact time as indicated for disinfecting solutions.   Assessment & Plan:

## 2020-10-26 NOTE — Patient Instructions (Signed)
We will check the labs today. Try the cetirizine to see if this helps the throat.

## 2020-10-27 ENCOUNTER — Encounter: Payer: Self-pay | Admitting: Internal Medicine

## 2020-10-28 MED ORDER — SIMVASTATIN 20 MG PO TABS: 20.0000 mg | ORAL_TABLET | Freq: Every day | ORAL | 3 refills | Status: DC

## 2020-10-28 NOTE — Assessment & Plan Note (Signed)
Flu shot up to date. Covid-19 up to date including booster. Pneumonia up to date. Shingrix counseled. Tetanus up to date. Colonoscopy up to date. Mammogram up to date, pap smear up to date. Counseled about sun safety and mole surveillance. Counseled about the dangers of distracted driving. Given 10 year screening recommendations.

## 2020-10-28 NOTE — Assessment & Plan Note (Signed)
Better on jardiance.

## 2020-10-28 NOTE — Assessment & Plan Note (Signed)
BP at goal on losartan and spironolactone. Checking CMP and adjust as needed.

## 2020-10-28 NOTE — Assessment & Plan Note (Signed)
Foot exam done, checking HgA1c and adjust as needed. Prior reading above goal and we changed to jardiance. She is on ARB but not statin. Adjust as needed.

## 2020-11-02 ENCOUNTER — Other Ambulatory Visit: Payer: Self-pay | Admitting: Internal Medicine

## 2020-11-02 DIAGNOSIS — E119 Type 2 diabetes mellitus without complications: Secondary | ICD-10-CM

## 2020-11-17 ENCOUNTER — Other Ambulatory Visit: Payer: Self-pay

## 2020-11-17 MED ORDER — EMPAGLIFLOZIN 10 MG PO TABS: ORAL_TABLET | ORAL | 6 refills | Status: DC

## 2020-11-26 ENCOUNTER — Ambulatory Visit
Admission: RE | Admit: 2020-11-26 | Discharge: 2020-11-26 | Disposition: A | Discharge status: Home / Self Care | Payer: 59 | Source: Ambulatory Visit | Attending: Internal Medicine | Admitting: Internal Medicine

## 2020-11-26 ENCOUNTER — Other Ambulatory Visit: Payer: Self-pay

## 2020-11-26 DIAGNOSIS — Z1231 Encounter for screening mammogram for malignant neoplasm of breast: Secondary | ICD-10-CM

## 2020-12-01 ENCOUNTER — Other Ambulatory Visit: Payer: Self-pay

## 2020-12-01 ENCOUNTER — Ambulatory Visit (INDEPENDENT_AMBULATORY_CARE_PROVIDER_SITE_OTHER): Payer: Medicare Other

## 2020-12-01 ENCOUNTER — Ambulatory Visit (INDEPENDENT_AMBULATORY_CARE_PROVIDER_SITE_OTHER): Payer: Medicare Other | Admitting: Internal Medicine

## 2020-12-01 ENCOUNTER — Encounter: Payer: Self-pay | Admitting: Internal Medicine

## 2020-12-01 VITALS — BP 122/60 | HR 65 | Temp 98.2°F | Resp 18 | Ht 66.0 in | Wt 212.2 lb

## 2020-12-01 DIAGNOSIS — R195 Other fecal abnormalities: Secondary | ICD-10-CM | POA: Diagnosis not present

## 2020-12-01 DIAGNOSIS — M25552 Pain in left hip: Secondary | ICD-10-CM | POA: Diagnosis not present

## 2020-12-01 NOTE — Assessment & Plan Note (Signed)
Recent change, past history breast cancer. Last colonoscopy 2016 and no polyps found recommendation repeat 2026. If no finding left hip x-ray will order CT abdomen and pelvis to assess. Last CT abdomen and pelvis in system 2013 which was done to assess for mets.

## 2020-12-01 NOTE — Assessment & Plan Note (Signed)
Checking x-ray. She does have 2 past knee replacements and prior lumbar surgery. If no arthritis will check CT abdomen and pelvis for concurrent pencil stools.

## 2020-12-01 NOTE — Patient Instructions (Signed)
We will check an x-ray of the hip today. If that looks normal we can check a CT scan of the stomach to check the bowels for any blockage.

## 2020-12-01 NOTE — Progress Notes (Signed)
   Subjective:   Patient ID: [name], female    DOB: Jun 19, 1956, 65 y.o.   MRN: [id number]  HPI The patient is a 65 YO female coming in for concerns about left hip pain (started a long time ago but worse recently, is not present every day, more likely to happen later in the day, worsened by moving around too much, denies pain in the morning with this, denies injury or overuse, not relieved by BM, has not taken anything for this) and pencil like stools (started recently in the past month, is taking magnesium and able to go 1-2 times per day, no blood in stool, past breast cancer, last colonoscopy 2016 with recommendation for repeat in 10 years, no weight loss).  Review of Systems  Constitutional: Negative.   HENT: Negative.   Eyes: Negative.   Respiratory: Negative for cough, chest tightness and shortness of breath.   Cardiovascular: Negative for chest pain, palpitations and leg swelling.  Gastrointestinal: Negative for abdominal distention, abdominal pain, constipation, diarrhea, nausea and vomiting.       Pencil stools  Musculoskeletal: Positive for arthralgias. Negative for joint swelling and myalgias.  Skin: Negative.   Neurological: Negative.   Psychiatric/Behavioral: Negative.     Objective:  Physical Exam Constitutional:      Appearance: She is well-developed and well-nourished.  HENT:     Head: Normocephalic and atraumatic.  Eyes:     Extraocular Movements: EOM normal.  Cardiovascular:     Rate and Rhythm: Normal rate and regular rhythm.  Pulmonary:     Effort: Pulmonary effort is normal. No respiratory distress.     Breath sounds: Normal breath sounds. No wheezing or rales.  Abdominal:     General: Bowel sounds are normal. There is no distension.     Palpations: Abdomen is soft.     Tenderness: There is no abdominal tenderness. There is no rebound.  Musculoskeletal:        General: Tenderness present. No edema.     Cervical back: Normal range of motion.      Comments: Tenderness over the left groin  Skin:    General: Skin is warm and dry.  Neurological:     Mental Status: She is alert and oriented to person, place, and time.     Coordination: Coordination normal.  Psychiatric:        Mood and Affect: Mood and affect normal.     Vitals:   12/01/20 0837  BP: 122/60  Pulse: 65  Resp: 18  Temp: 98.2 F (36.8 C)  TempSrc: Oral  SpO2: 98%  Weight: 212 lb 3.2 oz (96.3 kg)  Height: 5\' 6"  (1.676 m)    This visit occurred during the SARS-CoV-2 public health emergency.  Safety protocols were in place, including screening questions prior to the visit, additional usage of staff PPE, and extensive cleaning of exam room while observing appropriate contact time as indicated for disinfecting solutions.   Assessment & Plan:

## 2020-12-02 ENCOUNTER — Other Ambulatory Visit: Payer: Self-pay | Admitting: Internal Medicine

## 2020-12-02 DIAGNOSIS — R1032 Left lower quadrant pain: Secondary | ICD-10-CM

## 2020-12-07 ENCOUNTER — Telehealth: Payer: Self-pay | Admitting: Internal Medicine

## 2020-12-07 NOTE — Telephone Encounter (Signed)
I spoke to pt regarding her CT scan and she wanted me to let you know her Medicare will be starting in a few weeks and she wanted to wait till then.

## 2021-02-10 ENCOUNTER — Encounter: Payer: Self-pay | Admitting: Internal Medicine

## 2021-03-01 ENCOUNTER — Ambulatory Visit
Admission: RE | Admit: 2021-03-01 | Discharge: 2021-03-01 | Disposition: A | Discharge status: Home / Self Care | Payer: 59 | Source: Ambulatory Visit | Attending: Internal Medicine | Admitting: Internal Medicine

## 2021-03-01 ENCOUNTER — Other Ambulatory Visit: Payer: Self-pay

## 2021-03-01 DIAGNOSIS — R197 Diarrhea, unspecified: Secondary | ICD-10-CM | POA: Diagnosis not present

## 2021-03-01 DIAGNOSIS — K829 Disease of gallbladder, unspecified: Secondary | ICD-10-CM | POA: Diagnosis not present

## 2021-03-01 DIAGNOSIS — K3189 Other diseases of stomach and duodenum: Secondary | ICD-10-CM | POA: Diagnosis not present

## 2021-03-01 DIAGNOSIS — K6389 Other specified diseases of intestine: Secondary | ICD-10-CM | POA: Diagnosis not present

## 2021-03-01 DIAGNOSIS — R1032 Left lower quadrant pain: Secondary | ICD-10-CM

## 2021-03-01 MED ORDER — IOPAMIDOL (ISOVUE-300) INJECTION 61%
100.0000 mL | Freq: Once | INTRAVENOUS | Status: AC | PRN
  Administered 2021-03-01: 100 mL via INTRAVENOUS

## 2021-05-05 ENCOUNTER — Other Ambulatory Visit: Payer: Self-pay | Admitting: Internal Medicine

## 2021-07-13 DIAGNOSIS — H2513 Age-related nuclear cataract, bilateral: Secondary | ICD-10-CM | POA: Diagnosis not present

## 2021-07-13 LAB — HM DIABETES EYE EXAM

## 2021-07-20 ENCOUNTER — Encounter: Payer: Self-pay | Admitting: Internal Medicine

## 2021-09-05 ENCOUNTER — Other Ambulatory Visit: Payer: Self-pay

## 2021-09-05 ENCOUNTER — Ambulatory Visit (INDEPENDENT_AMBULATORY_CARE_PROVIDER_SITE_OTHER): Payer: Medicare Other | Admitting: Internal Medicine

## 2021-09-05 ENCOUNTER — Encounter: Payer: Self-pay | Admitting: Internal Medicine

## 2021-09-05 VITALS — BP 118/78 | HR 67 | Resp 18 | Ht 66.0 in | Wt 188.8 lb

## 2021-09-05 DIAGNOSIS — I10 Essential (primary) hypertension: Secondary | ICD-10-CM | POA: Diagnosis not present

## 2021-09-05 DIAGNOSIS — E119 Type 2 diabetes mellitus without complications: Secondary | ICD-10-CM | POA: Diagnosis not present

## 2021-09-05 DIAGNOSIS — K8020 Calculus of gallbladder without cholecystitis without obstruction: Secondary | ICD-10-CM

## 2021-09-05 DIAGNOSIS — Z0000 Encounter for general adult medical examination without abnormal findings: Secondary | ICD-10-CM | POA: Diagnosis not present

## 2021-09-05 LAB — LIPID PANEL
Cholesterol: 150 mg/dL (ref 0–200)
HDL: 42.7 mg/dL (ref >39.00)
LDL Cholesterol: 82 mg/dL (ref 0–99)
NonHDL: 107.68
Total CHOL/HDL Ratio: 4
Triglycerides: 130 mg/dL (ref 0.0–149.0)
VLDL: 26 mg/dL (ref 0.0–40.0)

## 2021-09-05 LAB — CBC
HCT: 41.7 % (ref 36.0–46.0)
Hemoglobin: 13.6 g/dL (ref 12.0–15.0)
MCHC: 32.5 g/dL (ref 30.0–36.0)
MCV: 87.4 fl (ref 78.0–100.0)
Platelets: 383 10*3/uL (ref 150.0–400.0)
RBC: 4.77 Mil/uL (ref 3.87–5.11)
RDW: 14.2 % (ref 11.5–15.5)
WBC: 6.4 10*3/uL (ref 4.0–10.5)

## 2021-09-05 LAB — COMPREHENSIVE METABOLIC PANEL
ALT: 13 U/L (ref 0–35)
AST: 17 U/L (ref 0–37)
Albumin: 4.3 g/dL (ref 3.5–5.2)
Alkaline Phosphatase: 69 U/L (ref 39–117)
BUN: 19 mg/dL (ref 6–23)
CO2: 29 mEq/L (ref 19–32)
Calcium: 10.6 mg/dL — ABNORMAL HIGH (ref 8.4–10.5)
Chloride: 101 mEq/L (ref 96–112)
Creatinine, Ser: 1.34 mg/dL — ABNORMAL HIGH (ref 0.40–1.20)
GFR: 41.56 mL/min — ABNORMAL LOW (ref >60.00)
Glucose, Bld: 106 mg/dL — ABNORMAL HIGH (ref 70–99)
Potassium: 3.9 mEq/L (ref 3.5–5.1)
Sodium: 137 mEq/L (ref 135–145)
Total Bilirubin: 0.8 mg/dL (ref 0.2–1.2)
Total Protein: 7.8 g/dL (ref 6.0–8.3)

## 2021-09-05 LAB — HEMOGLOBIN A1C: Hgb A1c MFr Bld: 7 % — ABNORMAL HIGH (ref 4.6–6.5)

## 2021-09-05 NOTE — Progress Notes (Signed)
Subjective:   Patient ID: [name], female    DOB: 10-28-1955, 65 y.o.   MRN: [id number]  HPI Here for welcome to medicare wellness and physical, no new complaints. Please see A/P for status and treatment of chronic medical problems.   Diet: DM since diabetic Physical activity: sedentary Depression/mood screen: negative Hearing: intact to whispered voice Visual acuity: grossly normal with lens, performs annual eye exam  ADLs: capable Fall risk: none Home safety: good Cognitive evaluation: intact to orientation, naming, recall and repetition EOL planning: adv directives discussed, in place  [employer] Visit from 09/05/2021 in [place] at [facility] Total Score 0        Fall Risk 08/23/2018 08/24/2018 08/24/2018 03/31/2019 09/26/2020  Patient Fall Risk Level Moderate fall risk Moderate fall risk Moderate fall risk Low fall risk Low fall risk   Vision Screening   Right eye Left eye Both eyes  Without correction     With correction 20/25 20/15     I have personally reviewed and have noted 1. The patient's medical and social history - reviewed today no changes 2. Their use of alcohol, tobacco or illicit drugs 3. Their current medications and supplements 4. The patient's functional ability including ADL's, fall risks, home safety risks and hearing or visual impairment. 5. Diet and physical activities 6. Evidence for depression or mood disorders 7. Care team reviewed and updated 8.  The patient is not on an opioid pain medication.  Patient Care Team: Hoyt Koch, MD as PCP - General (Internal Medicine) Past Medical History:  Diagnosis Date   Breast cancer [facility]) 2011   right breast with axillary lymph node removal   Diabetes mellitus, type II ([place])    type 2   GERD (gastroesophageal reflux disease)    Hypertension    Osteoarthritis of both knees    Personal history of chemotherapy    Personal history of radiation therapy     Pneumonia    in early 1980's   Past Surgical History:  Procedure Laterality Date   ABDOMINAL HYSTERECTOMY     APPENDECTOMY  1978   BLADDER SUSPENSION     BREAST LUMPECTOMY Right 2011   JOINT REPLACEMENT     bil knees   KNEE ARTHROPLASTY Left 06/29/2016   Procedure: LEFT TOTAL KNEE WITH NAVIGATION;  Surgeon: Rod Can, MD;  Location[facility];  Service: Orthopedics;  Laterality: Left;   KNEE ARTHROPLASTY Right 12/14/2016   Procedure: RIGHT TOTAL KNEE ARTHROPLASTY WITH COMPUTER NAVIGATION;  Surgeon: Rod Can, MD;  Location: [facility];  Service: Orthopedics;  Laterality: Right;  Needs RNFA   LUMBAR LAMINECTOMY/DECOMPRESSION MICRODISCECTOMY N/A 08/23/2018   Procedure: L3-4 DECOMPRESSION, REMOVAL OF INTRASPINAL EXTRADURAL FACET CYST;  Surgeon: Marybelle Killings, MD;  Location: [place];  Service: Orthopedics;  Laterality: N/A;   MENISCUS REPAIR     right knee   OOPHORECTOMY     Family History  Adopted: Yes  Problem Relation Age of Onset   Breast cancer Maternal Aunt    Review of Systems  Constitutional: Negative.   HENT: Negative.    Eyes: Negative.   Respiratory:  Negative for cough, chest tightness and shortness of breath.   Cardiovascular:  Negative for chest pain, palpitations and leg swelling.  Gastrointestinal:  Negative for abdominal distention, abdominal pain, constipation, diarrhea, nausea and vomiting.  Musculoskeletal: Negative.   Skin: Negative.   Neurological: Negative.   Psychiatric/Behavioral: Negative.     Objective:  Physical Exam Constitutional:  Appearance: She is well-developed.  HENT:     Head: Normocephalic and atraumatic.  Cardiovascular:     Rate and Rhythm: Normal rate and regular rhythm.  Pulmonary:     Effort: Pulmonary effort is normal. No respiratory distress.     Breath sounds: Normal breath sounds. No wheezing or rales.  Abdominal:     General: Bowel sounds are normal. There is no distension.     Palpations: Abdomen is soft.      Tenderness: There is no abdominal tenderness. There is no rebound.  Musculoskeletal:     Cervical back: Normal range of motion.  Skin:    General: Skin is warm and dry.  Neurological:     Mental Status: She is alert and oriented to person, place, and time.     Coordination: Coordination normal.    Vitals:   09/05/21 1444  BP: 118/78  Pulse: 67  Resp: 18  SpO2: 98%  Weight: 188 lb 12.8 oz (85.6 kg)  Height: 5\' 6"  (1.676 m)   This visit occurred during the SARS-CoV-2 public health emergency.  Safety protocols were in place, including screening questions prior to the visit, additional usage of staff PPE, and extensive cleaning of exam room while observing appropriate contact time as indicated for disinfecting solutions.   Assessment & Plan:

## 2021-09-05 NOTE — Patient Instructions (Signed)
We will do the labs today.   We will get the ultrasound of the gallbladder.

## 2021-09-06 DIAGNOSIS — K8020 Calculus of gallbladder without cholecystitis without obstruction: Secondary | ICD-10-CM | POA: Insufficient documentation

## 2021-09-06 NOTE — Assessment & Plan Note (Signed)
BP at goal on losartan 50 mg daily and spironolactone 25 mg daily. Checking CMP and adjust as needed. 

## 2021-09-06 NOTE — Assessment & Plan Note (Signed)
Flu shot got at pharmacy. Covid-19 booster up to date. Pneumonia declines today. Shingrix counseled to get at pharmacy. Tetanus due 2031. Colonoscopy due 2026. Mammogram due 2024, pap smear aged out and dexa due declines today. Counseled about sun safety and mole surveillance. Counseled about the dangers of distracted driving. Given 10 year screening recommendations.

## 2021-09-06 NOTE — Assessment & Plan Note (Signed)
Checking HgA1c and lipid panel. Foot exam done and up to date on eye exam. Adjust jardiance 10 mg daily as needed. Taking statin and ARB.

## 2021-09-06 NOTE — Assessment & Plan Note (Signed)
Seen on CT scan earlier this year and there was recommendation for follow up US RUQ which was ordered today. No pain RUQ or symptoms after eating fatty foods.

## 2021-09-20 ENCOUNTER — Ambulatory Visit
Admission: RE | Admit: 2021-09-20 | Discharge: 2021-09-20 | Disposition: A | Discharge status: Home / Self Care | Payer: Medicare Other | Source: Ambulatory Visit | Attending: Internal Medicine | Admitting: Internal Medicine

## 2021-09-20 DIAGNOSIS — K8020 Calculus of gallbladder without cholecystitis without obstruction: Secondary | ICD-10-CM

## 2021-09-20 DIAGNOSIS — K760 Fatty (change of) liver, not elsewhere classified: Secondary | ICD-10-CM | POA: Diagnosis not present

## 2021-09-21 ENCOUNTER — Encounter: Payer: Self-pay | Admitting: Internal Medicine

## 2021-10-20 ENCOUNTER — Other Ambulatory Visit: Payer: Self-pay | Admitting: Internal Medicine

## 2021-11-08 ENCOUNTER — Other Ambulatory Visit: Payer: Self-pay | Admitting: Internal Medicine

## 2021-11-08 DIAGNOSIS — Z1231 Encounter for screening mammogram for malignant neoplasm of breast: Secondary | ICD-10-CM

## 2021-11-28 ENCOUNTER — Ambulatory Visit
Admission: RE | Admit: 2021-11-28 | Discharge: 2021-11-28 | Disposition: A | Discharge status: Home / Self Care | Payer: Medicare Other | Source: Ambulatory Visit | Attending: Internal Medicine | Admitting: Internal Medicine

## 2021-11-28 ENCOUNTER — Ambulatory Visit: Payer: Medicare Other

## 2021-11-28 DIAGNOSIS — Z1231 Encounter for screening mammogram for malignant neoplasm of breast: Secondary | ICD-10-CM | POA: Diagnosis not present

## 2022-01-14 ENCOUNTER — Other Ambulatory Visit: Payer: Self-pay | Admitting: Internal Medicine

## 2022-01-14 DIAGNOSIS — E119 Type 2 diabetes mellitus without complications: Secondary | ICD-10-CM

## 2022-01-16 ENCOUNTER — Other Ambulatory Visit: Payer: Self-pay | Admitting: Internal Medicine

## 2022-04-07 ENCOUNTER — Other Ambulatory Visit: Payer: Self-pay | Admitting: Internal Medicine

## 2022-05-12 ENCOUNTER — Encounter: Payer: Self-pay | Admitting: Internal Medicine

## 2022-05-22 ENCOUNTER — Encounter: Payer: Self-pay | Admitting: Internal Medicine

## 2022-05-22 ENCOUNTER — Ambulatory Visit (INDEPENDENT_AMBULATORY_CARE_PROVIDER_SITE_OTHER): Payer: Medicare Other | Admitting: Internal Medicine

## 2022-05-22 DIAGNOSIS — R21 Rash and other nonspecific skin eruption: Secondary | ICD-10-CM

## 2022-05-22 MED ORDER — CLOBETASOL PROPIONATE 0.05 % EX OINT: 1.0000 | TOPICAL_OINTMENT | Freq: Two times a day (BID) | CUTANEOUS | 2 refills | Status: AC

## 2022-05-22 NOTE — Progress Notes (Signed)
   Subjective:   Patient ID: [name], female    DOB: Jul 31, 1956, 66 y.o.   MRN: [id number]  Psoriasis   The patient is a 66 YO coming in for new rash on scalp and ear.  Review of Systems  Constitutional: Negative.   HENT: Negative.    Eyes: Negative.   Respiratory:  Negative for cough, chest tightness and shortness of breath.   Cardiovascular:  Negative for chest pain, palpitations and leg swelling.  Gastrointestinal:  Negative for abdominal distention, abdominal pain, constipation, diarrhea, nausea and vomiting.  Musculoskeletal: Negative.   Skin:  Positive for rash.  Neurological: Negative.   Psychiatric/Behavioral: Negative.      Objective:  Physical Exam Constitutional:      Appearance: She is well-developed.  HENT:     Head: Normocephalic and atraumatic.     Ears:     Comments: Rash external ear bilaterally and on the posterior scalp Cardiovascular:     Rate and Rhythm: Normal rate and regular rhythm.  Pulmonary:     Effort: Pulmonary effort is normal. No respiratory distress.     Breath sounds: Normal breath sounds. No wheezing or rales.  Abdominal:     General: Bowel sounds are normal. There is no distension.     Palpations: Abdomen is soft.     Tenderness: There is no abdominal tenderness. There is no rebound.  Musculoskeletal:     Cervical back: Normal range of motion.  Skin:    General: Skin is warm and dry.  Neurological:     Mental Status: She is alert and oriented to person, place, and time.     Coordination: Coordination normal.     Vitals:   05/22/22 1425  BP: 102/80  Pulse: (!) 59  Temp: 98.2 F (36.8 C)  TempSrc: Oral  SpO2: 99%  Weight: 174 lb (78.9 kg)  Height: '5\' 6"'$  (1.676 m)    Assessment & Plan:

## 2022-05-22 NOTE — Assessment & Plan Note (Signed)
New rash consistent with psoriasis of the ear and scalp. Rx clobetasol to use externally BID.

## 2022-07-03 ENCOUNTER — Telehealth: Payer: Self-pay | Admitting: Internal Medicine

## 2022-07-03 NOTE — Telephone Encounter (Signed)
Left message for patient to call back and schedule Medicare Annual Wellness Visit (AWV).   Please offer to do virtually or by telephone.  Left office number and my jabber [phone].  Welcome to Medicare: 09/05/2021. Schedule appointment after 09/06/2022   Please schedule at anytime with Nurse Health Advisor.

## 2022-07-07 LAB — HM DIABETES EYE EXAM

## 2022-07-14 DIAGNOSIS — H2513 Age-related nuclear cataract, bilateral: Secondary | ICD-10-CM | POA: Diagnosis not present

## 2022-09-07 NOTE — Progress Notes (Signed)
Subjective:   [name] is a 66 y.o. female who presents for Medicare Annual (Subsequent) preventive examination. I connected with  [name] on 09/08/22 by a audio enabled telemedicine application and verified that I am speaking with the correct person using two identifiers.  Patient Location: Home  Provider Location: Home Office  I discussed the limitations of evaluation and management by telemedicine. The patient expressed understanding and agreed to proceed.  Review of Systems    Deferred to PCP Cardiac Risk Factors include: advanced age (>16mn, >>2women);diabetes mellitus;hypertension     Objective:    There were no vitals filed for this visit. There is no height or weight on file to calculate BMI.     09/08/2022   11:04 AM 08/24/2018    7:04 AM 08/22/2018    9:07 AM 01/31/2017    3:14 PM 01/26/2017    1:48 PM 01/24/2017    4:48 PM 01/17/2017    4:46 PM  Advanced Directives  Does Patient Have a Medical Advance Directive? Yes Yes Yes Yes Yes Yes Yes  Type of AParamedicof AHanamauluLiving will Living will;Healthcare Power of Attorney Living will;Healthcare Power of AWilcoxLiving will HRancho Palos VerdesLiving will HLanhamLiving will HBarabooLiving will  Does patient want to make changes to medical advance directive? No - Patient declined No - Patient declined       Copy of HProgress Villagein Chart? Yes - validated most recent copy scanned in chart (See row information)   No - copy requested No - copy requested No - copy requested No - copy requested    Current Medications (verified) Outpatient Encounter Medications as of 09/08/2022  Medication Sig   clobetasol ointment (TEMOVATE) 07.03% Apply 1 Application topically 2 (two) times daily.   Cyanocobalamin (VITAMIN B 12 PO) Take 2,500 mcg by mouth.   JARDIANCE 10 MG TABS tablet TAKE 1 TABLET BY  MOUTH  DAILY BEFORE BREAKFAST   losartan (COZAAR) 50 MG tablet TAKE 1 TABLET(50 MG) BY MOUTH DAILY   MAGNESIUM PO Take 500 mg by mouth daily.    nystatin-triamcinolone ointment (MYCOLOG) Apply 1 application topically 2 (two) times daily.   spironolactone (ALDACTONE) 25 MG tablet TAKE 1 TABLET(25 MG) BY MOUTH DAILY   simvastatin (ZOCOR) 20 MG tablet TAKE 1 TABLET(20 MG) BY MOUTH AT BEDTIME (Patient not taking: Reported on 05/22/2022)   [DISCONTINUED] Olmesartan-Amlodipine-HCTZ (TRIBENZOR) 40-5-25 MG TABS Take 1 tablet by mouth daily.   [DISCONTINUED] potassium chloride 20 MEQ TBCR Take 20 mEq by mouth as needed.   No facility-administered encounter medications on file as of 09/08/2022.    Allergies (verified) Patient has no known allergies.   History: Past Medical History:  Diagnosis Date   Breast cancer ([place] 2011   right breast with axillary lymph node removal   Diabetes mellitus, type II ([place]    type 2   GERD (gastroesophageal reflux disease)    Hypertension    Osteoarthritis of both knees    Personal history of chemotherapy    Personal history of radiation therapy    Pneumonia    in early 1980's   Past Surgical History:  Procedure Laterality Date   ABDOMINAL HYSTERECTOMY     APPENDECTOMY  1978   BLADDER SUSPENSION     BREAST LUMPECTOMY Right 2011   JOINT REPLACEMENT     bil knees   KNEE ARTHROPLASTY Left 06/29/2016   Procedure:  LEFT TOTAL KNEE WITH NAVIGATION;  Surgeon: Rod Can, MD;  Location: [facility];  Service: Orthopedics;  Laterality: Left;   KNEE ARTHROPLASTY Right 12/14/2016   Procedure: RIGHT TOTAL KNEE ARTHROPLASTY WITH COMPUTER NAVIGATION;  Surgeon: Rod Can, MD;  Location: [facility];  Service: Orthopedics;  Laterality: Right;  Needs RNFA   LUMBAR LAMINECTOMY/DECOMPRESSION MICRODISCECTOMY N/A 08/23/2018   Procedure: L3-4 DECOMPRESSION, REMOVAL OF INTRASPINAL EXTRADURAL FACET CYST;  Surgeon: Marybelle Killings, MD;  Location: [place];  Service: Orthopedics;   Laterality: N/A;   MENISCUS REPAIR     right knee   OOPHORECTOMY     Family History  Adopted: Yes  Problem Relation Age of Onset   Breast cancer Maternal Aunt    Social History   Socioeconomic History   Marital status: Married    Spouse name: [name]   Number of children: 3   Years of education: Not on file   Highest education level: Not on file  Occupational History   Occupation: medical records    Employer: [employer]  Tobacco Use   Smoking status: Former    Packs/day: 0.50    Years: 20.00    Total pack years: 10.00    Types: Cigarettes    Quit date: 10/14/2013    Years since quitting: 8.9   Smokeless tobacco: Never  Vaping Use   Vaping Use: Never used  Substance and Sexual Activity   Alcohol use: Yes    Comment: rarely   Drug use: No   Sexual activity: Yes  Other Topics Concern   Not on file  Social History Narrative   Not on file   Social Determinants of Health   Financial Resource Strain: Low Risk  (09/08/2022)   Overall Financial Resource Strain (CARDIA)    Difficulty of Paying Living Expenses: Not hard at all  Food Insecurity: No Food Insecurity (09/08/2022)   Hunger Vital Sign    Worried About Running Out of Food in the Last Year: Never true    [place] in the Last Year: Never true  Transportation Needs: No Transportation Needs (09/08/2022)   PRAPARE - Hydrologist (Medical): No    Lack of Transportation (Non-Medical): No  Physical Activity: Sufficiently Active (09/08/2022)   Exercise Vital Sign    Days of Exercise per Week: 5 days    Minutes of Exercise per Session: 50 min  Stress: No Stress Concern Present (09/08/2022)   [place]    Feeling of Stress : Not at all  Social Connections: [place] (09/08/2022)   Social Connection and Isolation Panel [NHANES]    Frequency of Communication with Friends and Family: More than three times a week     Frequency of Social Gatherings with Friends and Family: More than three times a week    Attends Religious Services: More than 4 times per year    Active Member of [employer] or Organizations: Yes    Attends Music therapist: More than 4 times per year    Marital Status: Married    Tobacco Counseling Counseling given: Not Answered   Clinical Intake:  Pre-visit preparation completed: Yes  Pain : No/denies pain     Nutritional Status: BMI 25 -29 Overweight Nutritional Risks: None Diabetes: Yes CBG done?: No (phone visit) Did pt. bring in CBG monitor from home?: No (phone visit)  How often do you need to have someone help you when you read instructions, pamphlets,  or other written materials from your doctor or pharmacy?: 1 - Never  Diabetic?Yes Nutrition Risk Assessment:  Has the patient had any N/V/D within the last 2 months?  No  Does the patient have any non-healing wounds?  No  Has the patient had any unintentional weight loss or weight gain?  No   Diabetes:  Is the patient diabetic?  Yes  If diabetic, was a CBG obtained today?  No , visit phone Did the patient bring in their glucometer from home?  No , phone visit How often do you monitor your CBG's? .   Financial Strains and Diabetes Management:  Are you having any financial strains with the device, your supplies or your medication? No .  Does the patient want to be seen by Chronic Care Management for management of their diabetes?  No  Would the patient like to be referred to a Nutritionist or for Diabetic Management?  No   Diabetic Exams:  Diabetic Eye Exam: Completed 07/07/22 Diabetic Foot Exam: Overdue, Pt has been advised about the importance in completing this exam. Pt is scheduled for diabetic foot exam on deferred to PCP.   Interpreter Needed?: No  Information entered by :: Emelia Loron RN   Activities of Daily Living    09/08/2022   11:02 AM  In your present state of health, do you have any  difficulty performing the following activities:  Hearing? 0  Vision? 0  Difficulty concentrating or making decisions? 0  Walking or climbing stairs? 0  Dressing or bathing? 0  Doing errands, shopping? 0  Preparing Food and eating ? N  Using the Toilet? N  In the past six months, have you accidently leaked urine? N  Do you have problems with loss of bowel control? N  Managing your Medications? N  Managing your Finances? N  Housekeeping or managing your Housekeeping? N    Patient Care Team: Hoyt Koch, MD as PCP - General (Internal Medicine)  Indicate any recent Medical Services you may have received from other than [facility] providers in the past year (date may be approximate).     Assessment:   This is a routine wellness examination for [place].  Hearing/Vision screen No results found.  Dietary issues and exercise activities discussed: Current Exercise Habits: Home exercise routine, Type of exercise: walking, Time (Minutes): 40, Frequency (Times/Week): 5, Weekly Exercise (Minutes/Week): 200, Intensity: Mild, Exercise limited by: orthopedic condition(s)   Goals Addressed             This Visit's Progress    Patient Stated       I want to join the [facility] and inquire about PT to improve the mobility of my knee.      Depression Screen    09/08/2022   10:58 AM 09/05/2021    2:43 PM 02/05/2020    2:52 PM 12/23/2018    9:03 AM  PHQ 2/9 Scores  PHQ - 2 Score 1 0 0 0    Fall Risk    09/08/2022   11:05 AM  [place] in the past year? 0  Number falls in past yr: 0  Injury with Fall? 0  Risk for fall due to : No Fall Risks  Follow up Falls evaluation completed    [place]:  Any stairs in or around the home? Yes  If so, are there any without handrails? Yes  Home free of loose throw rugs in walkways, pet beds, electrical  cords, etc? Yes  Adequate lighting in your home to reduce risk of falls? Yes   ASSISTIVE DEVICES  UTILIZED TO PREVENT FALLS:  Life alert? Yes  Use of a cane, walker or w/c? No  Grab bars in the bathroom? No  Shower chair or bench in shower? No  Elevated toilet seat or a handicapped toilet? No   Cognitive Function:        09/08/2022   11:05 AM  6CIT Screen  What Year? 0 points  What month? 0 points  What time? 0 points  Count back from 20 0 points  Months in reverse 0 points  Repeat phrase 0 points  Total Score 0 points    Immunizations Immunization History  Administered Date(s) Administered   Fluad Quad(high Dose 65+) 07/03/2022   Influenza Split 07/16/2012   Influenza, High Dose Seasonal PF 07/03/2022   Influenza,inj,Quad PF,6+ Mos 07/16/2014, 06/17/2016   Influenza-Unspecified 07/17/2015, 06/02/2017, 07/14/2020   Moderna Covid-19 Vaccine Bivalent Booster 11yr & up 07/03/2022   Moderna Sars-Covid-2 Vaccination 12/13/2019, 01/14/2020, 08/17/2020   Pfizer Covid-19 Vaccine Bivalent Booster 180yr& up 07/03/2022   Tdap 02/05/2020    TDAP status: Up to date  Flu Vaccine status: Up to date  Pneumococcal vaccine status: Due, Education has been provided regarding the importance of this vaccine. Advised may receive this vaccine at local pharmacy or Health Dept. Aware to provide a copy of the vaccination record if obtained from local pharmacy or Health Dept. Verbalized acceptance and understanding.  Covid-19 vaccine status: Information provided on how to obtain vaccines.   Qualifies for Shingles Vaccine? Yes   Zostavax completed No   Shingrix Completed?: No.    Education has been provided regarding the importance of this vaccine. Patient has been advised to call insurance company to determine out of pocket expense if they have not yet received this vaccine. Advised may also receive vaccine at local pharmacy or Health Dept. Verbalized acceptance and understanding.  Screening Tests Health Maintenance  Topic Date Due   Hepatitis C Screening  Never done   Zoster Vaccines-  Shingrix (1 of 2) Never done   Pneumonia Vaccine 6593Years old (1 - PCV) Never done   DEXA SCAN  12/30/2020   Diabetic kidney evaluation - Urine ACR  02/04/2021   HEMOGLOBIN A1C  03/06/2022   COVID-19 Vaccine (5 - 2023-24 season) 08/28/2022   Diabetic kidney evaluation - GFR measurement  09/05/2022   FOOT EXAM  09/05/2022   OPHTHALMOLOGY EXAM  07/08/2023   Medicare Annual Wellness (AWV)  09/09/2023   MAMMOGRAM  11/29/2023   COLONOSCOPY (Pts [id number] coverage will need to be confirmed)  05/06/2025   DTaP/Tdap/Td (2 - Td or Tdap) 02/04/2030   INFLUENZA VACCINE  Completed   HPV VACCINES  Aged Out    Health Maintenance  Health Maintenance Due  Topic Date Due   Hepatitis C Screening  Never done   Zoster Vaccines- Shingrix (1 of 2) Never done   Pneumonia Vaccine 65+34ears old (1 - PCV) Never done   DEXA SCAN  12/30/2020   Diabetic kidney evaluation - Urine ACR  02/04/2021   HEMOGLOBIN A1C  03/06/2022   COVID-19 Vaccine (5 - 2023-24 season) 08/28/2022   Diabetic kidney evaluation - GFR measurement  09/05/2022   FOOT EXAM  09/05/2022    Colorectal cancer screening: Type of screening: Colonoscopy. Completed 05/07/15. Repeat every 10 years  Mammogram status: Completed 11/28/21. Repeat every year  Bone Density status: Ordered referral to [facility]  Pt provided with contact info and advised to call to schedule appt.  Lung Cancer Screening: (Low Dose CT Chest recommended if Age 59-80 years, 30 pack-year currently smoking OR have quit w/in 15years.) does not qualify.   Additional Screening:  Hepatitis C Screening: does qualify; Completed education provided  Vision Screening: Recommended annual ophthalmology exams for early detection of glaucoma and other disorders of the eye. Is the patient up to date with their annual eye exam?  Yes  Who is the provider or what is the name of the office in which the patient attends annual eye exams? [facility]  If pt is not  established with a provider, would they like to be referred to a provider to establish care?  N/A .   Dental Screening: Recommended annual dental exams for proper oral hygiene  Community Resource Referral / Chronic Care Management: CRR required this visit?  No   CCM required this visit?  No      Plan:     I have personally reviewed and noted the following in the patient's chart:   Medical and social history Use of alcohol, tobacco or illicit drugs  Current medications and supplements including opioid prescriptions. Patient is not currently taking opioid prescriptions. Functional ability and status Nutritional status Physical activity Advanced directives List of other physicians Hospitalizations, surgeries, and ER visits in previous 12 months Vitals Screenings to include cognitive, depression, and falls Referrals and appointments  In addition, I have reviewed and discussed with patient certain preventive protocols, quality metrics, and best practice recommendations. A written personalized care plan for preventive services as well as general preventive health recommendations were provided to patient.     Michiel Cowboy, RN   09/08/2022   Nurse Notes:  Ms. Ignatowski , Thank you for taking time to come for your Medicare Wellness Visit. I appreciate your ongoing commitment to your health goals. Please review the following plan we discussed and let me know if I can assist you in the future.   These are the goals we discussed:  Goals      Patient Stated     I want to join the [facility] and inquire about PT to improve the mobility of my knee.        This is a list of the screening recommended for you and due dates:  Health Maintenance  Topic Date Due   Hepatitis C Screening: USPSTF Recommendation to screen - Ages 57-79 yo.  Never done   Zoster (Shingles) Vaccine (1 of 2) Never done   Pneumonia Vaccine (1 - PCV) Never done   DEXA scan (bone density measurement)  12/30/2020   Yearly  kidney health urinalysis for diabetes  02/04/2021   Hemoglobin A1C  03/06/2022   COVID-19 Vaccine (5 - 2023-24 season) 08/28/2022   Yearly kidney function blood test for diabetes  09/05/2022   Complete foot exam   09/05/2022   Eye exam for diabetics  07/08/2023   Medicare Annual Wellness Visit  09/09/2023   Mammogram  11/29/2023   Colon Cancer Screening  05/06/2025   DTaP/Tdap/Td vaccine (2 - Td or Tdap) 02/04/2030   Flu Shot  Completed   HPV Vaccine  Aged Out

## 2022-09-07 NOTE — Patient Instructions (Signed)

## 2022-09-08 ENCOUNTER — Other Ambulatory Visit: Payer: Self-pay | Admitting: Internal Medicine

## 2022-09-08 ENCOUNTER — Ambulatory Visit (INDEPENDENT_AMBULATORY_CARE_PROVIDER_SITE_OTHER): Payer: Medicare Other | Admitting: *Deleted

## 2022-09-08 DIAGNOSIS — E2839 Other primary ovarian failure: Secondary | ICD-10-CM

## 2022-09-08 DIAGNOSIS — Z0000 Encounter for general adult medical examination without abnormal findings: Secondary | ICD-10-CM | POA: Diagnosis not present

## 2022-09-08 DIAGNOSIS — Z1231 Encounter for screening mammogram for malignant neoplasm of breast: Secondary | ICD-10-CM

## 2022-09-12 ENCOUNTER — Encounter: Payer: Self-pay | Admitting: Internal Medicine

## 2022-09-12 ENCOUNTER — Ambulatory Visit (INDEPENDENT_AMBULATORY_CARE_PROVIDER_SITE_OTHER): Payer: Medicare Other | Admitting: Internal Medicine

## 2022-09-12 VITALS — BP 112/80 | HR 72 | Temp 98.5°F | Ht 66.0 in | Wt 174.0 lb

## 2022-09-12 DIAGNOSIS — E1169 Type 2 diabetes mellitus with other specified complication: Secondary | ICD-10-CM | POA: Diagnosis not present

## 2022-09-12 DIAGNOSIS — Z23 Encounter for immunization: Secondary | ICD-10-CM | POA: Diagnosis not present

## 2022-09-12 DIAGNOSIS — E785 Hyperlipidemia, unspecified: Secondary | ICD-10-CM

## 2022-09-12 DIAGNOSIS — Z0000 Encounter for general adult medical examination without abnormal findings: Secondary | ICD-10-CM

## 2022-09-12 DIAGNOSIS — E118 Type 2 diabetes mellitus with unspecified complications: Secondary | ICD-10-CM

## 2022-09-12 DIAGNOSIS — I10 Essential (primary) hypertension: Secondary | ICD-10-CM | POA: Diagnosis not present

## 2022-09-12 DIAGNOSIS — E119 Type 2 diabetes mellitus without complications: Secondary | ICD-10-CM

## 2022-09-12 LAB — COMPREHENSIVE METABOLIC PANEL
ALT: 16 U/L (ref 0–35)
AST: 19 U/L (ref 0–37)
Albumin: 4.5 g/dL (ref 3.5–5.2)
Alkaline Phosphatase: 65 U/L (ref 39–117)
BUN: 19 mg/dL (ref 6–23)
CO2: 29 mEq/L (ref 19–32)
Calcium: 10.7 mg/dL — ABNORMAL HIGH (ref 8.4–10.5)
Chloride: 102 mEq/L (ref 96–112)
Creatinine, Ser: 1.21 mg/dL — ABNORMAL HIGH (ref 0.40–1.20)
GFR: 46.64 mL/min — ABNORMAL LOW (ref >60.00)
Glucose, Bld: 93 mg/dL (ref 70–99)
Potassium: 4.1 mEq/L (ref 3.5–5.1)
Sodium: 137 mEq/L (ref 135–145)
Total Bilirubin: 0.6 mg/dL (ref 0.2–1.2)
Total Protein: 7.8 g/dL (ref 6.0–8.3)

## 2022-09-12 LAB — CBC
HCT: 43.8 % (ref 36.0–46.0)
Hemoglobin: 14.1 g/dL (ref 12.0–15.0)
MCHC: 32.1 g/dL (ref 30.0–36.0)
MCV: 88.5 fl (ref 78.0–100.0)
Platelets: 394 10*3/uL (ref 150.0–400.0)
RBC: 4.95 Mil/uL (ref 3.87–5.11)
RDW: 13.8 % (ref 11.5–15.5)
WBC: 4.6 10*3/uL (ref 4.0–10.5)

## 2022-09-12 LAB — MICROALBUMIN / CREATININE URINE RATIO
Creatinine,U: 27.9 mg/dL
Microalb Creat Ratio: 2.5 mg/g (ref 0.0–30.0)
Microalb, Ur: <0.7 mg/dL (ref 0.0–1.9)

## 2022-09-12 LAB — LIPID PANEL
Cholesterol: 161 mg/dL (ref 0–200)
HDL: 48.6 mg/dL (ref >39.00)
LDL Cholesterol: 88 mg/dL (ref 0–99)
NonHDL: 112.42
Total CHOL/HDL Ratio: 3
Triglycerides: 122 mg/dL (ref 0.0–149.0)
VLDL: 24.4 mg/dL (ref 0.0–40.0)

## 2022-09-12 LAB — HEMOGLOBIN A1C: Hgb A1c MFr Bld: 6.7 % — ABNORMAL HIGH (ref 4.6–6.5)

## 2022-09-12 NOTE — Progress Notes (Signed)
   Subjective:   Patient ID: [name], female    DOB: 21-Nov-1955, 66 y.o.   MRN: [id number]  HPI The patient is a 66 YO female coming in for physical  PMH, [facility], social history reviewed and updated  Review of Systems  Constitutional: Negative.   HENT: Negative.    Eyes: Negative.   Respiratory:  Negative for cough, chest tightness and shortness of breath.   Cardiovascular:  Negative for chest pain, palpitations and leg swelling.  Gastrointestinal:  Negative for abdominal distention, abdominal pain, constipation, diarrhea, nausea and vomiting.  Musculoskeletal:  Positive for arthralgias.  Skin: Negative.   Neurological: Negative.   Psychiatric/Behavioral: Negative.      Objective:  Physical Exam Constitutional:      Appearance: She is well-developed.  HENT:     Head: Normocephalic and atraumatic.  Cardiovascular:     Rate and Rhythm: Normal rate and regular rhythm.  Pulmonary:     Effort: Pulmonary effort is normal. No respiratory distress.     Breath sounds: Normal breath sounds. No wheezing or rales.  Abdominal:     General: Bowel sounds are normal. There is no distension.     Palpations: Abdomen is soft.     Tenderness: There is no abdominal tenderness. There is no rebound.  Musculoskeletal:     Cervical back: Normal range of motion.  Skin:    General: Skin is warm and dry.     Comments: Foot exam done  Neurological:     Mental Status: She is alert and oriented to person, place, and time.     Coordination: Coordination normal.     Vitals:   09/12/22 0907  BP: 112/80  Pulse: 72  Temp: 98.5 F (36.9 C)  TempSrc: Oral  SpO2: 96%  Weight: 174 lb (78.9 kg)  Height: '5\' 6"'$  (1.676 m)    Assessment & Plan:  Prevnar 20 given at visit

## 2022-09-15 DIAGNOSIS — E1169 Type 2 diabetes mellitus with other specified complication: Secondary | ICD-10-CM | POA: Insufficient documentation

## 2022-09-15 NOTE — Assessment & Plan Note (Signed)
Foot exam done ordered HGA1c microalbumin to creatinine ratio and CMP. Adjust jardiance 10 mg daily as needed. Taking statin and ARB.

## 2022-09-15 NOTE — Assessment & Plan Note (Signed)
Flu shot up to date. Covid-19 counseled. Pneumonia 20 given at visit. Shingrix counseled due at pharmacy. Tetanus due 2031. Colonoscopy due 2026. Mammogram due 2024, pap smear aged out and dexa complete. Counseled about sun safety and mole surveillance. Counseled about the dangers of distracted driving. Given 10 year screening recommendations.

## 2022-09-15 NOTE — Assessment & Plan Note (Signed)
Checking lipid panel and adjsut simvastatin 20 mg daily as needed for LDL goal <100.

## 2022-09-15 NOTE — Assessment & Plan Note (Signed)
BP at goal on losartan 50 mg daily and spironolactone 25 mg daily. Checking CMP and adjust as needed.

## 2022-11-29 ENCOUNTER — Ambulatory Visit
Admission: RE | Admit: 2022-11-29 | Discharge: 2022-11-29 | Disposition: A | Discharge status: Home / Self Care | Payer: Medicare Other | Source: Ambulatory Visit | Attending: Internal Medicine | Admitting: Internal Medicine

## 2022-11-29 DIAGNOSIS — Z1231 Encounter for screening mammogram for malignant neoplasm of breast: Secondary | ICD-10-CM | POA: Diagnosis not present

## 2022-12-04 ENCOUNTER — Encounter: Payer: Self-pay | Admitting: Internal Medicine

## 2022-12-04 LAB — HM MAMMOGRAPHY

## 2023-01-11 ENCOUNTER — Other Ambulatory Visit: Payer: Self-pay | Admitting: Internal Medicine

## 2023-01-11 DIAGNOSIS — E119 Type 2 diabetes mellitus without complications: Secondary | ICD-10-CM

## 2023-02-15 ENCOUNTER — Other Ambulatory Visit: Payer: Self-pay | Admitting: Internal Medicine

## 2023-02-21 ENCOUNTER — Encounter: Payer: Self-pay | Admitting: Internal Medicine

## 2023-03-02 ENCOUNTER — Ambulatory Visit
Admission: RE | Admit: 2023-03-02 | Discharge: 2023-03-02 | Disposition: A | Discharge status: Home / Self Care | Payer: Medicare Other | Source: Ambulatory Visit | Attending: Internal Medicine | Admitting: Internal Medicine

## 2023-03-02 DIAGNOSIS — Z853 Personal history of malignant neoplasm of breast: Secondary | ICD-10-CM | POA: Diagnosis not present

## 2023-03-02 DIAGNOSIS — E349 Endocrine disorder, unspecified: Secondary | ICD-10-CM | POA: Diagnosis not present

## 2023-03-02 DIAGNOSIS — E2839 Other primary ovarian failure: Secondary | ICD-10-CM

## 2023-03-05 ENCOUNTER — Telehealth: Payer: Self-pay | Admitting: Internal Medicine

## 2023-03-05 ENCOUNTER — Ambulatory Visit (INDEPENDENT_AMBULATORY_CARE_PROVIDER_SITE_OTHER): Payer: Medicare Other | Admitting: Family Medicine

## 2023-03-05 ENCOUNTER — Encounter: Payer: Self-pay | Admitting: Family Medicine

## 2023-03-05 VITALS — BP 102/70 | HR 74 | Temp 98.0°F | Resp 20 | Ht 66.0 in | Wt 173.0 lb

## 2023-03-05 DIAGNOSIS — N8110 Cystocele, unspecified: Secondary | ICD-10-CM | POA: Diagnosis not present

## 2023-03-05 NOTE — Progress Notes (Signed)
Assessment & Plan:  1. Female bladder prolapse - Ambulatory referral to Urogynecology   Follow up plan: Return if symptoms worsen or fail to improve.  Alexa Boston, MSN, APRN, FNP-C  Subjective:  HPI: [name] is a 67 y.o. female presenting on 03/05/2023 for bladder problem (Possible prolapse )  Patient reports a prolapsed bladder that she noticed a week and a half ago after getting out of the shower.  She states when she first wakes in the morning it is not prolapsed but gets worse throughout the day.  States she is not in pain, but is extremely aware of her stomach and vagina.  She had a hysterectomy at the age of 8.   ROS: Negative unless specifically indicated above in HPI.   Relevant past medical history reviewed and updated as indicated.   Allergies and medications reviewed and updated.   Current Outpatient Medications:    clobetasol ointment (TEMOVATE) 0.05 %, Apply 1 Application topically 2 (two) times daily., Disp: 100 g, Rfl: 2   Cyanocobalamin (VITAMIN B 12 PO), Take 2,500 mcg by mouth., Disp: , Rfl:    JARDIANCE 10 MG TABS tablet, TAKE 1 TABLET BY MOUTH  DAILY BEFORE BREAKFAST, Disp: 90 tablet, Rfl: 3   losartan (COZAAR) 50 MG tablet, TAKE 1 TABLET(50 MG) BY MOUTH DAILY, Disp: 90 tablet, Rfl: 1   MAGNESIUM PO, Take 500 mg by mouth daily. , Disp: , Rfl:    nystatin-triamcinolone ointment (MYCOLOG), Apply 1 application topically 2 (two) times daily., Disp: 100 g, Rfl: 2   spironolactone (ALDACTONE) 25 MG tablet, TAKE 1 TABLET(25 MG) BY MOUTH DAILY, Disp: 90 tablet, Rfl: 2  No Known Allergies  Objective:   BP 102/70   Pulse 74   Temp 98 F (36.7 C)   Resp 20   Ht 5\' 6"  (1.676 m)   Wt 173 lb (78.5 kg)   BMI 27.92 kg/m    Physical Exam Vitals reviewed. Exam conducted with a chaperone present (J. Bullins).  Constitutional:      General: She is not in acute distress.    Appearance: Normal appearance. She is not ill-appearing, toxic-appearing or  diaphoretic.  HENT:     Head: Normocephalic and atraumatic.  Eyes:     General: No scleral icterus.       Right eye: No discharge.        Left eye: No discharge.     Conjunctiva/sclera: Conjunctivae normal.  Cardiovascular:     Rate and Rhythm: Normal rate.  Pulmonary:     Effort: Pulmonary effort is normal. No respiratory distress.  Genitourinary:    Pubic Area: No rash.      Labia:        Right: No rash, tenderness, lesion or injury.        Left: No rash, tenderness, lesion or injury.      Comments: Bladder prolapse Musculoskeletal:        General: Normal range of motion.     Cervical back: Normal range of motion.  Skin:    General: Skin is warm and dry.     Capillary Refill: Capillary refill takes less than 2 seconds.  Neurological:     General: No focal deficit present.     Mental Status: She is alert and oriented to person, place, and time. Mental status is at baseline.  Psychiatric:        Mood and Affect: Mood normal.        Behavior: Behavior normal.  Thought Content: Thought content normal.        Judgment: Judgment normal.

## 2023-03-05 NOTE — Telephone Encounter (Signed)
LVM to have patient call back to speak with triage nurse. Patient self-scheduled for a possible prolapsed bladder. If patient calls back, please triage.

## 2023-03-07 LAB — HM DEXA SCAN: HM Dexa Scan: -1.3

## 2023-03-08 ENCOUNTER — Encounter: Payer: Self-pay | Admitting: Internal Medicine

## 2023-03-08 ENCOUNTER — Ambulatory Visit: Payer: Medicare Other | Admitting: Internal Medicine

## 2023-03-13 ENCOUNTER — Ambulatory Visit: Payer: Medicare Other | Admitting: Obstetrics and Gynecology

## 2023-03-13 ENCOUNTER — Encounter: Payer: Self-pay | Admitting: Obstetrics and Gynecology

## 2023-03-13 VITALS — BP 105/70 | HR 71 | Ht 64.0 in | Wt 173.8 lb

## 2023-03-13 DIAGNOSIS — N993 Prolapse of vaginal vault after hysterectomy: Secondary | ICD-10-CM

## 2023-03-13 DIAGNOSIS — N952 Postmenopausal atrophic vaginitis: Secondary | ICD-10-CM | POA: Diagnosis not present

## 2023-03-13 DIAGNOSIS — R350 Frequency of micturition: Secondary | ICD-10-CM

## 2023-03-13 DIAGNOSIS — N8110 Cystocele, unspecified: Secondary | ICD-10-CM

## 2023-03-13 LAB — POCT URINALYSIS DIPSTICK
Bilirubin, UA: NEGATIVE
Blood, UA: NEGATIVE
Glucose, UA: POSITIVE — AB
Ketones, UA: NEGATIVE
Leukocytes, UA: NEGATIVE
Nitrite, UA: NEGATIVE
Protein, UA: NEGATIVE
Spec Grav, UA: 1.015 (ref 1.010–1.025)
Urobilinogen, UA: 0.2 E.U./dL
pH, UA: 6 (ref 5.0–8.0)

## 2023-03-13 MED ORDER — ESTRADIOL 0.1 MG/GM VA CREA: 0.5000 g | TOPICAL_CREAM | VAGINAL | 11 refills | Status: DC

## 2023-03-13 NOTE — Progress Notes (Signed)
[facility] New Patient Evaluation and Consultation  Referring Provider: Myrlene Broker, * PCP: Myrlene Broker, MD Date of Service: 03/13/2023  SUBJECTIVE Chief Complaint: New Patient (Initial Visit) [name] is a 67 y.o. female is here for possible prolapse.)  History of Present Illness: [name] is a 67 y.o. Black or African-American female seen in consultation at the request of Dr. Okey Dupre for evaluation of pelvic organ prolapse.    Review of records significant for: Abdominal hysterectomy Former smoker Last A1c 6.7 (09/12/22)  CT Abdomen Pelvis W Contrast (03/01/21)  Reproductive: Status post hysterectomy. Similar posterior positioning of the right ovary with unchanged prominent residual ovarian tissue measuring up to 3.5 cm in maximum dimension and unchanged dating back to CT May 27, 2010. Left ovary is not visualized and there is no suspicious left adnexal mass.  Urinary Symptoms: Leaks urine with with a full bladder and with urgency Leaks occasionally  She is not bothered by her UI symptoms.  Day time voids 5-6.  Nocturia: 1 times per night to void. Voiding dysfunction: she empties her bladder well.  does not use a catheter to empty bladder.  When urinating, she feels dribbling after finishing Drinks: 12oz Coffee, 30oz Water, and Carbonated water  per day  UTIs: 0 UTI's in the last year.   Denies history of blood in urine, kidney or bladder stones, pyelonephritis, bladder cancer, and kidney cancer  Pelvic Organ Prolapse Symptoms:                  She Admits to a feeling of a bulge the vaginal area. It has been present for 2 weeks.  She Admits to seeing a bulge.  This bulge is not bothersome.  Bowel Symptom: Bowel movements: 2 time(s) per day Stool consistency: loose Straining: no.  Splinting: no.  Incomplete evacuation: no.  She Denies accidental bowel leakage / fecal incontinence Bowel regimen:  magnesium  Last  colonoscopy: Date 2016/2017, Results WNL  Sexual Function Sexually active: yes.  Sexual orientation: Straight Pain with sex: No -Has not had intercourse since prolapse  Pelvic Pain Denies pelvic pain    Past Medical History:  Past Medical History:  Diagnosis Date   Breast cancer (HCC) 2011   right breast with axillary lymph node removal   Diabetes mellitus, type II (HCC)    type 2   GERD (gastroesophageal reflux disease)    Hypertension    Osteoarthritis of both knees    Personal history of chemotherapy    Personal history of radiation therapy    Pneumonia    in early 1980's     Past Surgical History:   Past Surgical History:  Procedure Laterality Date   ABDOMINAL HYSTERECTOMY     APPENDECTOMY  1978   BLADDER SUSPENSION     BREAST LUMPECTOMY Right 2011   JOINT REPLACEMENT     bil knees   KNEE ARTHROPLASTY Left 06/29/2016   Procedure: LEFT TOTAL KNEE WITH NAVIGATION;  Surgeon: Samson Frederic, MD;  Location: [facility];  Service: Orthopedics;  Laterality: Left;   KNEE ARTHROPLASTY Right 12/14/2016   Procedure: RIGHT TOTAL KNEE ARTHROPLASTY WITH COMPUTER NAVIGATION;  Surgeon: Samson Frederic, MD;  Location: [facility];  Service: Orthopedics;  Laterality: Right;  Needs RNFA   LUMBAR LAMINECTOMY/DECOMPRESSION MICRODISCECTOMY N/A 08/23/2018   Procedure: L3-4 DECOMPRESSION, REMOVAL OF INTRASPINAL EXTRADURAL FACET CYST;  Surgeon: Eldred Manges, MD;  Location: [facility];  Service: Orthopedics;  Laterality: N/A;   MENISCUS REPAIR  right knee   OOPHORECTOMY       Past OB/GYN History: G3 P3 Vaginal deliveries: 3,  Forceps/ Vacuum deliveries: 0, Cesarean section: 0 Menopausal: Yes    Medications: She has a current medication list which includes the following prescription(s): clobetasol ointment, cyanocobalamin, [START ON 03/15/2023] estradiol, jardiance, losartan, magnesium, nystatin-triamcinolone ointment, spironolactone, [DISCONTINUED] olmesartan-amlodipine-hctz, and [DISCONTINUED]  potassium chloride er.   Allergies: Patient has No Known Allergies.   Social History:  Social History   Tobacco Use   Smoking status: Former    Packs/day: 0.50    Years: 20.00    Additional pack years: 0.00    Total pack years: 10.00    Types: Cigarettes    Quit date: 10/14/2013    Years since quitting: 9.4   Smokeless tobacco: Never  Vaping Use   Vaping Use: Never used  Substance Use Topics   Alcohol use: Yes    Comment: rarely   Drug use: No    Relationship status: married She lives with spouse.   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 Regular exercise: Yes: walking and stationary pedals History of abuse: No  Family History:   Family History  Adopted: Yes  Problem Relation Age of Onset   Breast cancer Maternal Aunt      Review of Systems: Review of Systems  Constitutional:  Positive for malaise/fatigue. Negative for chills, fever and weight loss.  Respiratory:  Negative for cough, shortness of breath and wheezing.   Cardiovascular:  Negative for chest pain, palpitations and leg swelling.  Gastrointestinal:  Positive for abdominal pain. Negative for blood in stool, constipation, diarrhea, nausea and vomiting.  Musculoskeletal:  Positive for myalgias.  Skin:  Negative for rash.  Neurological:  Positive for weakness. Negative for dizziness and headaches.  Endo/Heme/Allergies:  Does not bruise/bleed easily.  Psychiatric/Behavioral:  Negative for depression and suicidal ideas. The patient is not nervous/anxious.      OBJECTIVE Physical Exam: Vitals:   03/13/23 0836  BP: 105/70  Pulse: 71  Weight: 173 lb 12.8 oz (78.8 kg)  Height: 5\' 4"  (1.626 m)    Physical Exam Constitutional:      General: She is not in acute distress.    Appearance: Normal appearance. She is normal weight. She is not ill-appearing.  Pulmonary:     Effort: Pulmonary effort is normal.  Abdominal:     General: Abdomen is flat. There is no distension.     Tenderness: There is no  abdominal tenderness.  Genitourinary:    General: Normal vulva.     Vagina: No vaginal discharge.  Neurological:     General: No focal deficit present.     Mental Status: She is alert and oriented to person, place, and time.  Psychiatric:        Mood and Affect: Mood normal.        Behavior: Behavior normal.        Thought Content: Thought content normal.        Judgment: Judgment normal.      GU / Detailed Urogynecologic Evaluation:  Pelvic Exam: Normal external female genitalia; Bartholin's and Skene's glands normal in appearance; urethral meatus normal in appearance, no urethral masses or discharge.   CST: negative   s/p hysterectomy: Speculum exam reveals normal vaginal mucosa with  atrophy and normal vaginal cuff.  Adnexa normal adnexa.    With apex supported, anterior compartment defect was present  Pelvic floor strength I/V  Pelvic floor musculature: Right levator non-tender, Right obturator tender, Left  levator tender, Left obturator non-tender  POP-Q:   POP-Q  3                                            Aa   3                                           Ba  -5                                              C   6                                            Gh  4.5                                            Pb  8                                            tvl   -2.5                                            Ap  -2.5                                            Bp                                                 D      Rectal Exam:  Normal external exam.  Post-Void Residual (PVR) by Bladder Scan: In order to evaluate bladder emptying, we discussed obtaining a postvoid residual and she agreed to this procedure.  Procedure: The ultrasound unit was placed on the patient's abdomen in the suprapubic region after the patient had voided. A PVR of 116 ml was obtained by bladder scan.  Laboratory Results: POC Urine: Positive for glucose, negative for all other  components  ASSESSMENT AND PLAN Ms. Morris is a 67 y.o. with:  1. Prolapse of anterior vaginal wall   2. Vaginal vault prolapse after hysterectomy   3. Urinary frequency   4. Vaginal atrophy    Patient has stage III/IV Anterior, II/IV Vaginal vault, and 0/IV Posterior prolapse. Discussed with patient the significance of prolapse and options for treatment. Diagrams used to discuss prolapse and surgical options. Patient given printouts with surgical information to consider.  We discussed pessary which patient was not enthusiastic about trying.  She is interested in surgical repair. We discussed  Sacrocolpopexy repair as well as the pros and cons and also Sacrospinous fixation. We discussed that she will potentially need anterior repair regardless of main surgical repair due to stretching of the tissue. Does not need surgical clearance, A1c is well controlled.  PVR was 116 today by bladder scan. As patient has stage 3 anterior wall prolapse, will need to do Urodynamics. Will plan for Urodynamics to assess bladder emptying and for occult SUI and then follow up with surgeon for surgical planning.  Patient has vaginal atrophy. As she is also considering surgical repair, will start her on vaginal estrogen for atrophy and to increase tissue quality for repair.   Patient to follow up for Urodynamics, then will plan for surgical planning follow up.     Selmer Dominion, NP

## 2023-03-13 NOTE — Patient Instructions (Addendum)
   You have stage 3 Anterior wall prolapse and Stage 2 Vaginal vault prolapse   Plan for urodynamics and then follow up for surgical planning with Dr. Florian Buff.

## 2023-03-15 ENCOUNTER — Other Ambulatory Visit: Payer: Self-pay | Admitting: Internal Medicine

## 2023-04-11 ENCOUNTER — Encounter: Payer: Self-pay | Admitting: Obstetrics and Gynecology

## 2023-04-11 ENCOUNTER — Ambulatory Visit (INDEPENDENT_AMBULATORY_CARE_PROVIDER_SITE_OTHER): Payer: Medicare Other | Admitting: Obstetrics and Gynecology

## 2023-04-11 VITALS — BP 95/64 | HR 62

## 2023-04-11 DIAGNOSIS — R350 Frequency of micturition: Secondary | ICD-10-CM | POA: Diagnosis not present

## 2023-04-11 DIAGNOSIS — N8110 Cystocele, unspecified: Secondary | ICD-10-CM

## 2023-04-11 DIAGNOSIS — N993 Prolapse of vaginal vault after hysterectomy: Secondary | ICD-10-CM | POA: Diagnosis not present

## 2023-04-11 LAB — POCT URINALYSIS DIPSTICK
Bilirubin, UA: NEGATIVE
Blood, UA: NEGATIVE
Glucose, UA: POSITIVE — AB
Ketones, UA: NEGATIVE
Leukocytes, UA: NEGATIVE
Nitrite, UA: NEGATIVE
Protein, UA: NEGATIVE
Spec Grav, UA: 1.01 (ref 1.010–1.025)
Urobilinogen, UA: 0.2 E.U./dL
pH, UA: 6 (ref 5.0–8.0)

## 2023-04-11 NOTE — Progress Notes (Signed)
[facility] Urodynamics Procedure  Referring Physician: Myrlene Broker, * Date of Procedure: 04/11/2023  [name] is a 67 y.o. female who presents for urodynamic evaluation. Indication(s) for study: occult SUI  Vital Signs: BP 95/64   Pulse 62   Laboratory Results: A clean catch urine specimen revealed:  POC urine: + for Glucose, negative for all other componenets   Voiding Diary: Not Done  Procedure Timeout:  The correct patient was verified and the correct procedure was verified. The patient was in the correct position and safety precautions were reviewed based on at the patient's history.  Urodynamic Procedure A 52F dual lumen urodynamics catheter was placed under sterile conditions into the patient's bladder. A 52F catheter was placed into the rectum in order to measure abdominal pressure. EMG patches were placed in the appropriate position.  All connections were confirmed and calibrations/adjusted made. Saline was instilled into the bladder through the dual lumen catheters.  Cough/valsalva pressures were measured periodically during filling.  Patient was allowed to void.  The bladder was then emptied of its residual.  UROFLOW: Revealed a Qmax of 12.9 mL/sec.  She voided 120.4 mL and had a residual of 25 mL.  It was a normal pattern and represented normal habits though interpretation limited due to low voided volume.  CMG: This was performed with sterile water in the sitting position at a fill rate of 20-30 mL/min.    First sensation of fullness was 25 mLs,  First urge was 106 mLs,  Strong urge was 283 mLs and  Capacity was 566 mLs  Stress incontinence was not demonstrated Highest negative Barrier CLPP was 126 cmH20 at 352 ml. Highest negative Barrier VLPP was 79 cmH20 at 203 ml.  Detrusor function was overactive, with phasic contractions seen.  The first occurred at  26 mL to 3.2 cm of water and was associated with urge.  Compliance:  Normal.  End fill detrusor pressure was 1.7cmH20.  Calculated compliance was 333 [id number]  UPP: MUCP with barrier reduction was 87 cm of water.    MICTURITION STUDY: Voiding was performed with reduction using scopettes in the sitting position.  Pdet at Qmax was 24.8 cm of water.  Qmax was 24.6 mL/sec.  It was a normal pattern.  She voided 546 mL and had a residual of 20 mL.  It was a volitional void, sustained detrusor contraction was present and abdominal straining was not present  EMG: This was performed with patches.  She had voluntary contractions, recruitment with fill was present and urethral sphincter was not relaxed with void.  The details of the procedure with the study tracings have been scanned into EPIC.   Urodynamic Impression:  1. Sensation was increased; capacity was normal 2. Stress Incontinence was not demonstrated at normal pressures; 3. Detrusor Overactivity was demonstrated without leakage. 4. Emptying was normal with a normal PVR, a sustained detrusor contraction present,  abdominal straining not present, dyssynergic urethral sphincter activity on EMG.  Plan: - The patient will follow up  to discuss the findings and treatment options.

## 2023-04-11 NOTE — Patient Instructions (Signed)

## 2023-05-01 ENCOUNTER — Encounter (HOSPITAL_COMMUNITY): Payer: Self-pay | Admitting: Pharmacy Technician

## 2023-05-01 ENCOUNTER — Emergency Department (HOSPITAL_COMMUNITY)
Admission: EM | Admit: 2023-05-01 | Discharge: 2023-05-01 | Disposition: A | Discharge status: Home / Self Care | Payer: Medicare Other | Attending: Emergency Medicine | Admitting: Emergency Medicine

## 2023-05-01 ENCOUNTER — Emergency Department (HOSPITAL_COMMUNITY): Payer: Medicare Other

## 2023-05-01 ENCOUNTER — Other Ambulatory Visit: Payer: Self-pay

## 2023-05-01 DIAGNOSIS — R6883 Chills (without fever): Secondary | ICD-10-CM | POA: Diagnosis present

## 2023-05-01 DIAGNOSIS — U071 COVID-19: Secondary | ICD-10-CM | POA: Insufficient documentation

## 2023-05-01 DIAGNOSIS — Z8616 Personal history of COVID-19: Secondary | ICD-10-CM

## 2023-05-01 DIAGNOSIS — R5383 Other fatigue: Secondary | ICD-10-CM | POA: Diagnosis not present

## 2023-05-01 HISTORY — DX: Personal history of COVID-19: Z86.16

## 2023-05-01 LAB — URINALYSIS, ROUTINE W REFLEX MICROSCOPIC
Bacteria, UA: NONE SEEN
Bilirubin Urine: NEGATIVE
Glucose, UA: >=500 mg/dL — AB
Hgb urine dipstick: NEGATIVE
Ketones, ur: NEGATIVE mg/dL
Leukocytes,Ua: NEGATIVE
Nitrite: NEGATIVE
Protein, ur: NEGATIVE mg/dL
Specific Gravity, Urine: 1.015 (ref 1.005–1.030)
pH: 6 (ref 5.0–8.0)

## 2023-05-01 LAB — CBC WITH DIFFERENTIAL/PLATELET
Abs Immature Granulocytes: 0.03 10*3/uL (ref 0.00–0.07)
Basophils Absolute: 0 10*3/uL (ref 0.0–0.1)
Basophils Relative: 1 %
Eosinophils Absolute: 0 10*3/uL (ref 0.0–0.5)
Eosinophils Relative: 0 %
HCT: 42.7 % (ref 36.0–46.0)
Hemoglobin: 13.6 g/dL (ref 12.0–15.0)
Immature Granulocytes: 1 %
Lymphocytes Relative: 6 %
Lymphs Abs: 0.3 10*3/uL — ABNORMAL LOW (ref 0.7–4.0)
MCH: 28.3 pg (ref 26.0–34.0)
MCHC: 31.9 g/dL (ref 30.0–36.0)
MCV: 89 fL (ref 80.0–100.0)
Monocytes Absolute: 0.9 10*3/uL (ref 0.1–1.0)
Monocytes Relative: 16 %
Neutro Abs: 4.5 10*3/uL (ref 1.7–7.7)
Neutrophils Relative %: 76 %
Platelets: 298 10*3/uL (ref 150–400)
RBC: 4.8 MIL/uL (ref 3.87–5.11)
RDW: 13.2 % (ref 11.5–15.5)
WBC: 5.9 10*3/uL (ref 4.0–10.5)
nRBC: 0 % (ref 0.0–0.2)

## 2023-05-01 LAB — SARS CORONAVIRUS 2 BY RT PCR: SARS Coronavirus 2 by RT PCR: POSITIVE — AB

## 2023-05-01 LAB — COMPREHENSIVE METABOLIC PANEL
ALT: 21 U/L (ref 0–44)
AST: 20 U/L (ref 15–41)
Albumin: 4 g/dL (ref 3.5–5.0)
Alkaline Phosphatase: 61 U/L (ref 38–126)
Anion gap: 5 (ref 5–15)
BUN: 24 mg/dL — ABNORMAL HIGH (ref 8–23)
CO2: 22 mmol/L (ref 22–32)
Calcium: 9.1 mg/dL (ref 8.9–10.3)
Chloride: 104 mmol/L (ref 98–111)
Creatinine, Ser: 1.5 mg/dL — ABNORMAL HIGH (ref 0.44–1.00)
GFR, Estimated: 38 mL/min — ABNORMAL LOW (ref >60)
Glucose, Bld: 120 mg/dL — ABNORMAL HIGH (ref 70–99)
Potassium: 4.9 mmol/L (ref 3.5–5.1)
Sodium: 131 mmol/L — ABNORMAL LOW (ref 135–145)
Total Bilirubin: 0.8 mg/dL (ref 0.3–1.2)
Total Protein: 7.6 g/dL (ref 6.5–8.1)

## 2023-05-01 LAB — CK: Total CK: 66 U/L (ref 38–234)

## 2023-05-01 MED ORDER — LACTATED RINGERS IV BOLUS
1000.0000 mL | Freq: Once | INTRAVENOUS | Status: AC
  Administered 2023-05-01: 1000 mL via INTRAVENOUS

## 2023-05-01 MED ORDER — DIPHENHYDRAMINE HCL 50 MG/ML IJ SOLN
25.0000 mg | Freq: Once | INTRAMUSCULAR | Status: AC
  Administered 2023-05-01: 25 mg via INTRAVENOUS
  Filled 2023-05-01: qty 1

## 2023-05-01 MED ORDER — METOCLOPRAMIDE HCL 5 MG/ML IJ SOLN
10.0000 mg | Freq: Once | INTRAMUSCULAR | Status: AC
  Administered 2023-05-01: 10 mg via INTRAVENOUS
  Filled 2023-05-01: qty 2

## 2023-05-01 MED ORDER — SODIUM CHLORIDE 0.9 % IV BOLUS
1000.0000 mL | Freq: Once | INTRAVENOUS | Status: AC
  Administered 2023-05-01: 1000 mL via INTRAVENOUS

## 2023-05-01 NOTE — ED Provider Notes (Signed)
[place] EMERGENCY DEPARTMENT AT [facility] Provider Note   CSN: [id number] Arrival date & time: 05/01/23  9629     History  Chief Complaint  Patient presents with   Fatigue   Fever   Headache    [name] is a 67 y.o. female.  HPI Patient presents with her fianc who assists with the history. She presents with several days of rhinorrhea fatigue headache chills and diffuse body ache. She states that she is generally well less until onset of illness.  About 1 month ago however, the patient was in [place]. She notes no rash, no other focal insect lesions though she does have multiple bites. No specific confusion, disorientation, speech difficulty nor focal weakness. No relief with OTC medication.     Home Medications Prior to Admission medications   Medication Sig Start Date End Date Taking? Authorizing Provider  clobetasol ointment (TEMOVATE) 0.05 % Apply 1 Application topically 2 (two) times daily. 05/22/22   Myrlene Broker, MD  Cyanocobalamin (VITAMIN B 12 PO) Take 2,500 mcg by mouth.    [provider]  estradiol (ESTRACE) 0.1 MG/GM vaginal cream Place 0.5 g vaginally 2 (two) times a week. Place 0.5g nightly for two weeks then twice a week after 03/15/23   Selmer Dominion, NP  JARDIANCE 10 MG TABS tablet TAKE 1 TABLET BY MOUTH DAILY  BEFORE BREAKFAST 03/16/23   Myrlene Broker, MD  losartan (COZAAR) 50 MG tablet TAKE 1 TABLET(50 MG) BY MOUTH DAILY 02/15/23   Myrlene Broker, MD  MAGNESIUM PO Take 500 mg by mouth daily.     [provider]  nystatin-triamcinolone ointment (MYCOLOG) Apply 1 application topically 2 (two) times daily. 02/05/20   Myrlene Broker, MD  spironolactone (ALDACTONE) 25 MG tablet TAKE 1 TABLET(25 MG) BY MOUTH DAILY 01/11/23   Myrlene Broker, MD  Olmesartan-Amlodipine-HCTZ [facility]) 40-5-25 MG TABS Take 1 tablet by mouth daily.  12/20/11  [provider]  potassium chloride 20  MEQ TBCR Take 20 mEq by mouth as needed. 10/29/13 06/29/14  Sherald Hess, NP      Allergies    Patient has no known allergies.    Review of Systems   Review of Systems  All other systems reviewed and are negative.   Physical Exam Updated Vital Signs Pulse 78   Temp 98.6 F (37 C) (Oral)   Resp 12   Ht 5\' 5"  (1.651 m)   Wt 78 kg   SpO2 96%   BMI 28.62 kg/m  Physical Exam Vitals and nursing note reviewed.  Constitutional:      General: She is not in acute distress.    Appearance: She is well-developed.  HENT:     Head: Normocephalic and atraumatic.  Eyes:     Conjunctiva/sclera: Conjunctivae normal.  Cardiovascular:     Rate and Rhythm: Normal rate and regular rhythm.  Pulmonary:     Effort: Pulmonary effort is normal. No respiratory distress.     Breath sounds: Normal breath sounds. No stridor.  Abdominal:     General: There is no distension.  Skin:    General: Skin is warm and dry.  Neurological:     Mental Status: She is alert and oriented to person, place, and time.     Cranial Nerves: No cranial nerve deficit.  Psychiatric:        Mood and Affect: Mood normal.     ED Results / Procedures / Treatments   Labs (all  labs ordered are listed, but only abnormal results are displayed) Labs Reviewed  SARS CORONAVIRUS 2 BY RT PCR - Abnormal; Notable for the following components:      Result Value   SARS Coronavirus 2 by RT PCR POSITIVE (*)    All other components within normal limits  COMPREHENSIVE METABOLIC PANEL - Abnormal; Notable for the following components:   Sodium 131 (*)    Glucose, Bld 120 (*)    BUN 24 (*)    Creatinine, Ser 1.50 (*)    GFR, Estimated 38 (*)    All other components within normal limits  CBC WITH DIFFERENTIAL/PLATELET - Abnormal; Notable for the following components:   Lymphs Abs 0.3 (*)    All other components within normal limits  URINALYSIS, ROUTINE W REFLEX MICROSCOPIC - Abnormal; Notable for the following components:   Glucose,  UA >=500 (*)    All other components within normal limits  CK    EKG None  Radiology DG Chest 2 View  Result Date: 05/01/2023 CLINICAL DATA:  Fatigue. EXAM: CHEST - 2 VIEW COMPARISON:  August 23, 2018. FINDINGS: The heart size and mediastinal contours are within normal limits. Both lungs are clear. The visualized skeletal structures are unremarkable. IMPRESSION: No active cardiopulmonary disease. Electronically Signed   By: Lupita Raider M.D.   On: 05/01/2023 11:41    Procedures Procedures    Medications Ordered in ED Medications  metoCLOPramide (REGLAN) injection 10 mg (has no administration in time range)  diphenhydrAMINE (BENADRYL) injection 25 mg (has no administration in time range)  lactated ringers bolus 1,000 mL (has no administration in time range)  sodium chloride 0.9 % bolus 1,000 mL (1,000 mLs Intravenous New Bag/Given 05/01/23 1137)    ED Course/ Medical Decision Making/ A&P                                 Medical Decision Making Adult female presents with generalized illness.  She is awake, alert, afebrile, hemodynamically unremarkable initially which is generally reassuring, though broad differential including infection, pneumonia, bacteremia, sepsis, SIRS, COVID considered.  Cardiac 80 sinus normal Pulse ox 99% room air normal Patient is a smoker, additional considerations include malignancy.  Amount and/or Complexity of Data Reviewed Independent Historian: friend External Data Reviewed: notes. Labs: ordered. Decision-making details documented in ED Course. Radiology: ordered and independent interpretation performed. Decision-making details documented in ED Course. ECG/medicine tests: ordered and independent interpretation performed. Decision-making details documented in ED Course.  Risk Prescription drug management. Decision regarding hospitalization.   12:47 PM Labs reviewed, discussed, notable for glucose urea, evidence for dehydration with  slight elevation in creatinine.  Patient is also COVID-positive.  She has received fluids, is hemodynamically unremarkable aside from mild hypotension consistent with suspicion for dehydration.  CK normal no evidence for rhabdomyolysis.  No neuro complications, and suspicion for headache secondary to COVID and or dehydration.  Patient appropriate for discharge with close outpatient follow-up.        Final Clinical Impression(s) / ED Diagnoses Final diagnoses:  COVID    Rx / DC Orders ED Discharge Orders     None         Gerhard Munch, MD 05/01/23 1248

## 2023-05-01 NOTE — Discharge Instructions (Addendum)
Recovery from COVID infection can take several weeks.  Please stay well-hydrated, monitor your condition carefully and do not hesitate to return here for concerning changes in your condition.

## 2023-05-01 NOTE — ED Triage Notes (Signed)
Pt here with reports of runny nose, fatigue, headache, chills and generalized body aches onset yesterday. States took covid test at home and it was negative.

## 2023-05-02 ENCOUNTER — Ambulatory Visit: Payer: Medicare Other | Admitting: Obstetrics and Gynecology

## 2023-05-03 ENCOUNTER — Encounter: Payer: Self-pay | Admitting: Internal Medicine

## 2023-05-03 MED ORDER — NIRMATRELVIR/RITONAVIR (PAXLOVID) TABLET (RENAL DOSING): 2.0000 | ORAL_TABLET | Freq: Two times a day (BID) | ORAL | 0 refills | Status: AC

## 2023-05-03 NOTE — Telephone Encounter (Signed)
Please advise as MD is out of office

## 2023-05-07 ENCOUNTER — Telehealth: Payer: Self-pay | Admitting: *Deleted

## 2023-05-07 ENCOUNTER — Telehealth: Payer: Self-pay

## 2023-05-07 NOTE — Telephone Encounter (Signed)
Transition Care Management Follow-up Telephone Call Date of discharge and from where: 05/01/2023 [facility] How have you been since you were released from the hospital? Patient is feeling much better after taking medication. Any questions or concerns? No  Items Reviewed: Did the pt receive and understand the discharge instructions provided? Yes  Medications obtained and verified?  No medication prescribed in ER. Patient's PCP prescribed nirmatrelvir and she was able to fill prescription. Other? No  Any new allergies since your discharge? No  Dietary orders reviewed? Yes Do you have support at home? Yes   Follow up appointments reviewed:  PCP Hospital f/u appt confirmed? Yes  Scheduled to see Hillard Danker, MD on 05/03/2023 @ telephone. Specialist Hospital f/u appt confirmed? No  Scheduled to see  on  @ . Are transportation arrangements needed? No  If their condition worsens, is the pt aware to call PCP or go to the Emergency Dept.? Yes Was the patient provided with contact information for the PCP's office or ED? Yes Was to pt encouraged to call back with questions or concerns? Yes  [name] [name]  [facility] Population Health Community Resource Care Guide   ??[name]  ?? [phone]   Website: [email or web address]  [email or web address]

## 2023-05-07 NOTE — Progress Notes (Signed)
  Care Coordination   Note   05/07/2023 Name: [name] MRN: [id number] DOB: 1956-07-03  [name] is a 67 y.o. year old female who sees Myrlene Broker, MD for primary care. I reached out to Langston Reusing by phone today to offer care coordination services.  Ms. Hommel was given information about Care Coordination services today including:   The Care Coordination services include support from the care team which includes your Nurse Coordinator, Clinical Social Worker, or Pharmacist.  The Care Coordination team is here to help remove barriers to the health concerns and goals most important to you. Care Coordination services are voluntary, and the patient may decline or stop services at any time by request to their care team member.   Care Coordination Consent Status: Patient agreed to services and verbal consent obtained.   Follow up plan:  Telephone appointment with care coordination team member scheduled for:  05/14/2023  Encounter Outcome:  Pt. Scheduled  Burman Nieves, CCMA Care Coordination Care Guide Direct Dial: [phone]

## 2023-05-14 ENCOUNTER — Ambulatory Visit: Payer: Self-pay | Admitting: Licensed Clinical Social Worker

## 2023-05-14 NOTE — Patient Outreach (Signed)
  Care Coordination  Initial Visit Note   05/14/2023 Name: ADALAY RUCKER MRN: [id number] DOB: 03-May-1956  JOEE MIZENKO is a 67 y.o. year old female who sees Myrlene Broker, MD for primary care. I spoke with  Langston Reusing by phone today.  What matters to the patients health and wellness today?    Patient reports she is starting to feel better. Had frustration with being diagnosis with COVID.  No needs at this time and no goals identified.  SDOH assessments and interventions completed:  Yes  SDOH Interventions Today    Flowsheet Row Most Recent Value  SDOH Interventions   Food Insecurity Interventions Intervention Not Indicated  Housing Interventions Intervention Not Indicated  Transportation Interventions Intervention Not Indicated  Utilities Interventions Intervention Not Indicated  Financial Strain Interventions Intervention Not Indicated  Stress Interventions Intervention Not Indicated       Care Coordination Interventions:  Yes, provided  Interventions Today    Flowsheet Row Most Recent Value  Chronic Disease   Chronic disease during today's visit Hypertension (HTN), Diabetes  General Interventions   General Interventions Discussed/Reviewed General Interventions Reviewed  Education Interventions   Education Provided Provided Education  Provided Verbal Education On Mental Health/Coping with Illness  Mental Health Interventions   Mental Health Discussed/Reviewed Mental Health Reviewed  [active listening,  solution focused,  task centered and PACING with house work]  Nutrition Interventions   Nutrition Discussed/Reviewed Nutrition Reviewed  Advanced Directive Interventions   Advanced Directives Discussed/Reviewed Advanced Directives Reviewed  [copy of documents in chart]       Follow up plan: No further intervention required.   Encounter Outcome:  Pt. Visit Completed   Sammuel Hines, LCSW Social Work Care Coordination  [facility] Emmie Niemann [employer] [phone]

## 2023-05-14 NOTE — Patient Instructions (Signed)
Social Work Visit Information  Thank you for taking time to visit with me today. Please don't hesitate to contact me if I can be of assistance to you.    Care Coordination provides support specific to your health needs that extend beyond exceptional routine office care you already receive from your primary care doctor.   There is no cost to you for Care Coordination.  The Care Coordination team is made up of the following team members: Registered Nurse Care Manager: disease management, health education, care coordination and complex case management Clinical Social Work: Complex Care Coordination including coordination of level of care needs, mental and behavioral health assessment and recommendations, and connection to long-term mental health support Clinical Pharmacist: medication management, assistance and disease management Community Resource Care Guides: community resource connections  Please call [phone] if you would like to schedule a phone appointment with one of the team members.    If you or anyone you know are experiencing a Mental Health or Behavioral Health Crisis or need someone to talk to, please call the Suicide and Crisis Lifeline: 988 call the [place] National Suicide Prevention Lifeline: 1-800-273-8255 or TTY: 1-800-799-4 TTY (1-800-799-4889) to talk to a trained counselor call 1-800-273-TALK (toll free, 24 hour hotline) go to [facility] [address], [place] ([phone])   Patient verbalizes understanding of instructions and care plan provided today and agrees to view in MyChart. Active MyChart status and patient understanding of how to access instructions and care plan via MyChart confirmed with patient.      Deborah Moore, LCSW Social Work Care Coordination   /[facility] [phone]     

## 2023-05-24 ENCOUNTER — Encounter: Payer: Self-pay | Admitting: Obstetrics and Gynecology

## 2023-05-24 ENCOUNTER — Ambulatory Visit: Payer: Medicare Other | Admitting: Obstetrics and Gynecology

## 2023-05-24 VITALS — BP 109/73 | HR 64

## 2023-05-24 DIAGNOSIS — N993 Prolapse of vaginal vault after hysterectomy: Secondary | ICD-10-CM | POA: Diagnosis not present

## 2023-05-24 DIAGNOSIS — N8110 Cystocele, unspecified: Secondary | ICD-10-CM

## 2023-05-24 NOTE — Progress Notes (Signed)
[facility] Return Visit  SUBJECTIVE  History of Present Illness: [name] is a 67 y.o. female seen in follow-up for prolapse. She is interested in surgery.   Urodynamic Impression:  1. Sensation was increased; capacity was normal 2. Stress Incontinence was not demonstrated at normal pressures; 3. Detrusor Overactivity was demonstrated without leakage. 4. Emptying was normal with a normal PVR, a sustained detrusor contraction present,  abdominal straining not present, dyssynergic urethral sphincter activity on EMG.  Past Medical History: Patient  has a past medical history of Breast cancer (HCC) (2011), Diabetes mellitus, type II (HCC), GERD (gastroesophageal reflux disease), Hypertension, Osteoarthritis of both knees, Personal history of chemotherapy, Personal history of radiation therapy, and Pneumonia.   Past Surgical History: She  has a past surgical history that includes Abdominal hysterectomy; Bladder suspension; Oophorectomy; Meniscus repair; Appendectomy (1978); Knee Arthroplasty (Left, 06/29/2016); Knee Arthroplasty (Right, 12/14/2016); Joint replacement; Lumbar laminectomy/decompression microdiscectomy (N/A, 08/23/2018); and Breast lumpectomy (Right, 2011).   Medications: She has a current medication list which includes the following prescription(s): clobetasol ointment, cyanocobalamin, estradiol, jardiance, losartan, magnesium, nystatin-triamcinolone ointment, spironolactone, [DISCONTINUED] olmesartan-amlodipine-hctz, and [DISCONTINUED] potassium chloride er.   Allergies: Patient has No Known Allergies.   Social History: Patient  reports that she quit smoking about 9 years ago. Her smoking use included cigarettes. She started smoking about 29 years ago. She has a 10 pack-year smoking history. She has never used smokeless tobacco. She reports current alcohol use. She reports that she does not use drugs.      OBJECTIVE     Physical Exam: Vitals:    05/24/23 0946  BP: 109/73  Pulse: 64   Gen: No apparent distress, A&O x 3.  Detailed Urogynecologic Evaluation:  Normal external genitalia. On speculum, normal vaginal mucosa. On bimanual, no masses present.  Abdomen: soft, nontender- midline incision present Prior exam showed:  POP-Q  1.5                                            Aa   1.5                                           Ba  -2                                              C   4                                            Gh  3                                            Pb  6.5                                            tvl   -  2                                            Ap  -2                                            Bp                                                 D      ASSESSMENT AND PLAN    [name] is a 67 y.o. with:  1. Prolapse of anterior vaginal wall   2. Vaginal vault prolapse after hysterectomy    - Reviewed urodynamics. She will not need an incontinence procedure.   Plan for surgery: Exam under anesthesia, robotic assisted sacrocolpopexy, cystoscopy, possible vaginal repair if robotic surgery not possible  - We reviewed the patient's specific anatomic and functional findings, with the assistance of diagrams, and together finalized the above procedure. The planned surgical procedures were discussed along with the surgical risks outlined below, which were also provided on a detailed handout. Additional treatment options including expectant management, conservative management, medical management were discussed where appropriate.  We reviewed the benefits and risks of each treatment option.  - Since she has a prior midline incision, we discussed that there is a possibility of scar tissue which may inhibit completing the surgery robotically. If this is the case, will plan for vaginal native tissue repair.   General Surgical Risks: For all procedures, there are risks of bleeding, infection,  damage to surrounding organs including but not limited to bowel, bladder, blood vessels, ureters and nerves, and need for further surgery if an injury were to occur. These risks are all low with minimally invasive surgery.   There are risks of numbness and weakness at any body site or buttock/rectal pain.  It is possible that baseline pain can be worsened by surgery, either with or without mesh. If surgery is vaginal, there is also a low risk of possible conversion to laparoscopy or open abdominal incision where indicated. Very rare risks include blood transfusion, blood clot, heart attack, pneumonia, or death.   There is also a risk of short-term postoperative urinary retention with need to use a catheter. About half of patients need to go home from surgery with a catheter, which is then later removed in the office. The risk of long-term need for a catheter is very low. There is also a risk of worsening of overactive bladder.   Prolapse (with or without mesh): Risk factors for surgical failure  include things that put pressure on your pelvis and the surgical repair, including obesity, chronic cough, and heavy lifting or straining (including lifting children or adults, straining on the toilet, or lifting heavy objects such as furniture or anything weighing >25 lbs. Risks of recurrence is 20-30% with vaginal native tissue repair and a less than 10% with sacrocolpopexy with mesh.    Sacrocolpopexy: Mesh implants may provide more prolapse support, but do have some unique risks to consider. It is important to understand that mesh is permanent and cannot be easily removed. Risks of abdominal  sacrocolpopexy mesh include mesh exposure (~3-6%), painful intercourse (recent studies show lower rates after surgery compared to before, with ~5-8% risk of new onset), and very rare risks of bowel or bladder injury or infection (<1%). The risk of mesh exposure is more likely in a woman with risks for poor healing (prior  radiation, poorly controlled diabetes, or immunocompromised). The risk of new or worsened chronic pain after mesh implant is more common in women with baseline chronic pain and/or poorly controlled anxiety or depression. There is an FDA safety notification on vaginal mesh procedures for prolapse but NOT abdominal mesh procedures and therefore does not apply to your surgery. We have extensive experience and training with mesh placement and we have close postoperative follow up to identify any potential complications from mesh.    - For preop Visit:  She is required to have a visit within 30 days of her surgery.   - Medical clearance: not required but will need updated A1c prior to surgery - Anticoagulant use: No - Medicaid Hysterectomy form: No - Accepts blood transfusion: Yes - Expected length of stay: outpatient  Request sent for surgery scheduling.   Marguerita Beards, MD

## 2023-05-24 NOTE — Patient Instructions (Signed)
Plan for surgery: Robotic sacrocolpopexy (mesh), cystoscopy, possible vaginal repair if robotic surgery not possible.

## 2023-06-12 ENCOUNTER — Encounter (HOSPITAL_BASED_OUTPATIENT_CLINIC_OR_DEPARTMENT_OTHER): Payer: Self-pay | Admitting: Obstetrics and Gynecology

## 2023-06-12 ENCOUNTER — Other Ambulatory Visit: Payer: Self-pay

## 2023-06-12 NOTE — Progress Notes (Signed)
Spoke w/ via phone for pre-op interview---pt Lab needs dos----I stat, ekg               Lab results------none COVID test -----patient states asymptomatic no test needed Arrive at -------630 06-20-2023 NPO after MN NO Solid Food.  Clear liquids from MN until---530 Med rec completed Medications to take morning of surgery -----none Diabetic medication -----no jardiance day of surgery Patient instructed no nail polish to be worn day of surgery Patient instructed to bring photo id and insurance card day of surgery Patient aware to have Driver (ride ) / caregiver   edward picker  for 24 hours after surgery  Patient Special Instructions -----none Pre-Op special Instructions -----none Patient verbalized understanding of instructions that were given at this phone interview. Patient denies shortness of breath, chest pain, fever, cough at this phone interview.

## 2023-06-20 ENCOUNTER — Encounter (HOSPITAL_BASED_OUTPATIENT_CLINIC_OR_DEPARTMENT_OTHER)
Admission: RE | Disposition: A | Discharge status: Home / Self Care | Payer: Self-pay | Source: Home / Self Care | Attending: Obstetrics and Gynecology

## 2023-06-20 ENCOUNTER — Other Ambulatory Visit: Payer: Self-pay

## 2023-06-20 ENCOUNTER — Encounter (HOSPITAL_BASED_OUTPATIENT_CLINIC_OR_DEPARTMENT_OTHER): Payer: Self-pay | Admitting: Obstetrics and Gynecology

## 2023-06-20 ENCOUNTER — Ambulatory Visit (HOSPITAL_BASED_OUTPATIENT_CLINIC_OR_DEPARTMENT_OTHER): Payer: Medicare Other | Admitting: Anesthesiology

## 2023-06-20 ENCOUNTER — Ambulatory Visit (HOSPITAL_BASED_OUTPATIENT_CLINIC_OR_DEPARTMENT_OTHER)
Admission: RE | Admit: 2023-06-20 | Discharge: 2023-06-20 | Disposition: A | Discharge status: Home / Self Care | Payer: Medicare Other | Attending: Obstetrics and Gynecology | Admitting: Obstetrics and Gynecology

## 2023-06-20 DIAGNOSIS — G4733 Obstructive sleep apnea (adult) (pediatric): Secondary | ICD-10-CM

## 2023-06-20 DIAGNOSIS — I129 Hypertensive chronic kidney disease with stage 1 through stage 4 chronic kidney disease, or unspecified chronic kidney disease: Secondary | ICD-10-CM | POA: Diagnosis not present

## 2023-06-20 DIAGNOSIS — E785 Hyperlipidemia, unspecified: Secondary | ICD-10-CM | POA: Diagnosis not present

## 2023-06-20 DIAGNOSIS — N181 Chronic kidney disease, stage 1: Secondary | ICD-10-CM | POA: Diagnosis not present

## 2023-06-20 DIAGNOSIS — F17210 Nicotine dependence, cigarettes, uncomplicated: Secondary | ICD-10-CM | POA: Diagnosis not present

## 2023-06-20 DIAGNOSIS — Z01818 Encounter for other preprocedural examination: Secondary | ICD-10-CM

## 2023-06-20 DIAGNOSIS — N8110 Cystocele, unspecified: Secondary | ICD-10-CM | POA: Diagnosis not present

## 2023-06-20 DIAGNOSIS — F17200 Nicotine dependence, unspecified, uncomplicated: Secondary | ICD-10-CM | POA: Diagnosis not present

## 2023-06-20 DIAGNOSIS — E1122 Type 2 diabetes mellitus with diabetic chronic kidney disease: Secondary | ICD-10-CM | POA: Insufficient documentation

## 2023-06-20 DIAGNOSIS — Z90710 Acquired absence of both cervix and uterus: Secondary | ICD-10-CM | POA: Insufficient documentation

## 2023-06-20 DIAGNOSIS — I10 Essential (primary) hypertension: Secondary | ICD-10-CM

## 2023-06-20 DIAGNOSIS — E119 Type 2 diabetes mellitus without complications: Secondary | ICD-10-CM | POA: Diagnosis not present

## 2023-06-20 DIAGNOSIS — N993 Prolapse of vaginal vault after hysterectomy: Secondary | ICD-10-CM | POA: Diagnosis present

## 2023-06-20 DIAGNOSIS — K219 Gastro-esophageal reflux disease without esophagitis: Secondary | ICD-10-CM | POA: Insufficient documentation

## 2023-06-20 HISTORY — DX: Chronic kidney disease, unspecified: N18.9

## 2023-06-20 HISTORY — DX: Presence of spectacles and contact lenses: Z97.3

## 2023-06-20 HISTORY — PX: CYSTOSCOPY: SHX5120

## 2023-06-20 HISTORY — PX: ROBOTIC ASSISTED LAPAROSCOPIC SACROCOLPOPEXY: SHX5388

## 2023-06-20 LAB — POCT I-STAT, CHEM 8
BUN: 22 mg/dL (ref 8–23)
Calcium, Ion: 1.29 mmol/L (ref 1.15–1.40)
Chloride: 107 mmol/L (ref 98–111)
Creatinine, Ser: 1.4 mg/dL — ABNORMAL HIGH (ref 0.44–1.00)
Glucose, Bld: 104 mg/dL — ABNORMAL HIGH (ref 70–99)
HCT: 44 % (ref 36.0–46.0)
Hemoglobin: 15 g/dL (ref 12.0–15.0)
Potassium: 4.4 mmol/L (ref 3.5–5.1)
Sodium: 139 mmol/L (ref 135–145)
TCO2: 22 mmol/L (ref 22–32)

## 2023-06-20 LAB — GLUCOSE, CAPILLARY: Glucose-Capillary: 162 mg/dL — ABNORMAL HIGH (ref 70–99)

## 2023-06-20 LAB — TYPE AND SCREEN
ABO/RH(D): O POS
Antibody Screen: POSITIVE

## 2023-06-20 LAB — SURGICAL PCR SCREEN
MRSA, PCR: NEGATIVE
Staphylococcus aureus: NEGATIVE

## 2023-06-20 SURGERY — SACROCOLPOPEXY, ROBOT-ASSISTED, LAPAROSCOPIC
Anesthesia: General | Site: Pelvis

## 2023-06-20 MED ORDER — PHENAZOPYRIDINE HCL 100 MG PO TABS
ORAL_TABLET | ORAL | Status: AC
  Filled 2023-06-20: qty 2

## 2023-06-20 MED ORDER — CEFAZOLIN SODIUM-DEXTROSE 2-4 GM/100ML-% IV SOLN
2.0000 g | INTRAVENOUS | Status: AC
  Administered 2023-06-20: 2 g via INTRAVENOUS

## 2023-06-20 MED ORDER — ROCURONIUM BROMIDE 10 MG/ML (PF) SYRINGE
PREFILLED_SYRINGE | INTRAVENOUS | Status: AC
  Filled 2023-06-20: qty 10

## 2023-06-20 MED ORDER — CEFAZOLIN SODIUM-DEXTROSE 2-4 GM/100ML-% IV SOLN
INTRAVENOUS | Status: AC
  Filled 2023-06-20: qty 100

## 2023-06-20 MED ORDER — OXYCODONE HCL 5 MG PO TABS: 5.0000 mg | ORAL_TABLET | Freq: Once | ORAL | Status: DC | PRN

## 2023-06-20 MED ORDER — FENTANYL CITRATE (PF) 100 MCG/2ML IJ SOLN
INTRAMUSCULAR | Status: DC | PRN
  Administered 2023-06-20: 50 ug via INTRAVENOUS
  Administered 2023-06-20 (×2): 25 ug via INTRAVENOUS

## 2023-06-20 MED ORDER — GABAPENTIN 300 MG PO CAPS
300.0000 mg | ORAL_CAPSULE | ORAL | Status: AC
  Administered 2023-06-20: 300 mg via ORAL

## 2023-06-20 MED ORDER — STERILE WATER FOR IRRIGATION IR SOLN
Status: DC | PRN
  Administered 2023-06-20: 500 mL

## 2023-06-20 MED ORDER — ROCURONIUM BROMIDE 100 MG/10ML IV SOLN
INTRAVENOUS | Status: DC | PRN
  Administered 2023-06-20: 10 mg via INTRAVENOUS
  Administered 2023-06-20: 70 mg via INTRAVENOUS
  Administered 2023-06-20: 10 mg via INTRAVENOUS

## 2023-06-20 MED ORDER — LIDOCAINE HCL (CARDIAC) PF 100 MG/5ML IV SOSY
PREFILLED_SYRINGE | INTRAVENOUS | Status: DC | PRN
  Administered 2023-06-20: 100 mg via INTRAVENOUS

## 2023-06-20 MED ORDER — PROPOFOL 10 MG/ML IV BOLUS
INTRAVENOUS | Status: DC | PRN
  Administered 2023-06-20: 100 mg via INTRAVENOUS
  Administered 2023-06-20: 50 mg via INTRAVENOUS

## 2023-06-20 MED ORDER — IBUPROFEN 600 MG PO TABS: 600.0000 mg | ORAL_TABLET | Freq: Four times a day (QID) | ORAL | 0 refills | Status: DC | PRN

## 2023-06-20 MED ORDER — FENTANYL CITRATE (PF) 100 MCG/2ML IJ SOLN
INTRAMUSCULAR | Status: AC
  Filled 2023-06-20: qty 2

## 2023-06-20 MED ORDER — MUPIROCIN 2 % EX OINT
1.0000 | TOPICAL_OINTMENT | Freq: Two times a day (BID) | CUTANEOUS | Status: DC
  Filled 2023-06-20: qty 22

## 2023-06-20 MED ORDER — SUGAMMADEX SODIUM 200 MG/2ML IV SOLN
INTRAVENOUS | Status: DC | PRN
  Administered 2023-06-20: 200 mg via INTRAVENOUS

## 2023-06-20 MED ORDER — ACETAMINOPHEN 500 MG PO TABS
ORAL_TABLET | ORAL | Status: AC
  Filled 2023-06-20: qty 2

## 2023-06-20 MED ORDER — EPHEDRINE SULFATE (PRESSORS) 50 MG/ML IJ SOLN
INTRAMUSCULAR | Status: DC | PRN
  Administered 2023-06-20 (×5): 5 mg via INTRAVENOUS

## 2023-06-20 MED ORDER — DROPERIDOL 2.5 MG/ML IJ SOLN
INTRAMUSCULAR | Status: DC | PRN
Start: 2023-06-20 — End: 2023-06-20
  Administered 2023-06-20: .625 mg via INTRAVENOUS

## 2023-06-20 MED ORDER — PHENAZOPYRIDINE HCL 100 MG PO TABS
200.0000 mg | ORAL_TABLET | ORAL | Status: AC
  Administered 2023-06-20: 200 mg via ORAL

## 2023-06-20 MED ORDER — ENOXAPARIN SODIUM 40 MG/0.4ML IJ SOSY
PREFILLED_SYRINGE | INTRAMUSCULAR | Status: AC
  Filled 2023-06-20: qty 0.4

## 2023-06-20 MED ORDER — ONDANSETRON HCL 4 MG/2ML IJ SOLN
INTRAMUSCULAR | Status: AC
  Filled 2023-06-20: qty 2

## 2023-06-20 MED ORDER — PHENYLEPHRINE HCL (PRESSORS) 10 MG/ML IV SOLN
INTRAVENOUS | Status: DC | PRN
  Administered 2023-06-20 (×7): 80 ug via INTRAVENOUS
  Administered 2023-06-20: 160 ug via INTRAVENOUS

## 2023-06-20 MED ORDER — FLUORESCEIN SODIUM 10 % IV SOLN
INTRAVENOUS | Status: AC
  Filled 2023-06-20: qty 5

## 2023-06-20 MED ORDER — OXYCODONE HCL 5 MG/5ML PO SOLN: 5.0000 mg | Freq: Once | ORAL | Status: DC | PRN

## 2023-06-20 MED ORDER — BUPIVACAINE HCL (PF) 0.25 % IJ SOLN
INTRAMUSCULAR | Status: DC | PRN
  Administered 2023-06-20: 14 mL

## 2023-06-20 MED ORDER — LACTATED RINGERS IV SOLN: INTRAVENOUS | Status: DC

## 2023-06-20 MED ORDER — POVIDONE-IODINE 10 % EX SWAB: 2.0000 | Freq: Once | CUTANEOUS | Status: DC

## 2023-06-20 MED ORDER — ACETAMINOPHEN 500 MG PO TABS
1000.0000 mg | ORAL_TABLET | ORAL | Status: AC
  Administered 2023-06-20: 1000 mg via ORAL

## 2023-06-20 MED ORDER — ONDANSETRON HCL 4 MG/2ML IJ SOLN: 4.0000 mg | Freq: Once | INTRAMUSCULAR | Status: DC | PRN

## 2023-06-20 MED ORDER — HYDROMORPHONE HCL 1 MG/ML IJ SOLN
INTRAMUSCULAR | Status: AC
  Filled 2023-06-20: qty 1

## 2023-06-20 MED ORDER — GABAPENTIN 300 MG PO CAPS
ORAL_CAPSULE | ORAL | Status: AC
  Filled 2023-06-20: qty 1

## 2023-06-20 MED ORDER — ENOXAPARIN SODIUM 40 MG/0.4ML IJ SOSY
40.0000 mg | PREFILLED_SYRINGE | INTRAMUSCULAR | Status: AC
  Administered 2023-06-20: 40 mg via SUBCUTANEOUS

## 2023-06-20 MED ORDER — HYDROMORPHONE HCL 1 MG/ML IJ SOLN
0.2500 mg | INTRAMUSCULAR | Status: DC | PRN
  Administered 2023-06-20: 0.5 mg via INTRAVENOUS

## 2023-06-20 MED ORDER — DEXAMETHASONE SODIUM PHOSPHATE 4 MG/ML IJ SOLN
INTRAMUSCULAR | Status: DC | PRN
  Administered 2023-06-20: 5 mg via INTRAVENOUS

## 2023-06-20 MED ORDER — POLYETHYLENE GLYCOL 3350 17 GM/SCOOP PO POWD: 17.0000 g | Freq: Every day | ORAL | 0 refills | Status: DC

## 2023-06-20 MED ORDER — KETOROLAC TROMETHAMINE 30 MG/ML IJ SOLN
INTRAMUSCULAR | Status: AC
  Filled 2023-06-20: qty 1

## 2023-06-20 MED ORDER — KETOROLAC TROMETHAMINE 30 MG/ML IJ SOLN
INTRAMUSCULAR | Status: DC | PRN
Start: 2023-06-20 — End: 2023-06-20
  Administered 2023-06-20: 15 mg via INTRAVENOUS

## 2023-06-20 MED ORDER — PROPOFOL 10 MG/ML IV BOLUS
INTRAVENOUS | Status: AC
  Filled 2023-06-20: qty 20

## 2023-06-20 MED ORDER — SODIUM CHLORIDE 0.9 % IR SOLN
Status: DC | PRN
  Administered 2023-06-20: 1000 mL

## 2023-06-20 MED ORDER — DEXAMETHASONE SODIUM PHOSPHATE 10 MG/ML IJ SOLN
INTRAMUSCULAR | Status: AC
  Filled 2023-06-20: qty 1

## 2023-06-20 MED ORDER — OXYCODONE HCL 5 MG PO TABS
5.0000 mg | ORAL_TABLET | ORAL | 0 refills | Status: DC | PRN
Start: 2023-06-20 — End: 2023-12-06

## 2023-06-20 MED ORDER — ONDANSETRON HCL 4 MG/2ML IJ SOLN
INTRAMUSCULAR | Status: DC | PRN
  Administered 2023-06-20: 4 mg via INTRAVENOUS

## 2023-06-20 MED ORDER — ACETAMINOPHEN 500 MG PO TABS: 500.0000 mg | ORAL_TABLET | Freq: Four times a day (QID) | ORAL | 0 refills | Status: AC | PRN

## 2023-06-20 SURGICAL SUPPLY — 72 items
ADH SKN CLS APL DERMABOND .7 (GAUZE/BANDAGES/DRESSINGS) ×2
APL PRP STRL LF DISP 70% ISPRP (MISCELLANEOUS) ×2
BAG DRN RND TRDRP ANRFLXCHMBR (UROLOGICAL SUPPLIES) ×2
BAG URINE DRAIN 2000ML AR STRL (UROLOGICAL SUPPLIES) IMPLANT
CATH FOLEY 3WAY 5CC 16FR (CATHETERS) ×3 IMPLANT
CHLORAPREP W/TINT 26 (MISCELLANEOUS) ×3 IMPLANT
COVER BACK TABLE 60X90IN (DRAPES) ×3 IMPLANT
COVER TIP SHEARS 8 DVNC (MISCELLANEOUS) ×3 IMPLANT
DEFOGGER SCOPE WARMER CLEARIFY (MISCELLANEOUS) ×3 IMPLANT
DERMABOND ADVANCED .7 DNX12 (GAUZE/BANDAGES/DRESSINGS) ×3 IMPLANT
DRAPE ARM DVNC X/XI (DISPOSABLE) ×12 IMPLANT
DRAPE COLUMN DVNC XI (DISPOSABLE) ×3 IMPLANT
DRAPE SHEET LG 3/4 BI-LAMINATE (DRAPES) ×3 IMPLANT
DRAPE SURG IRRIG POUCH 19X23 (DRAPES) ×3 IMPLANT
DRAPE UTILITY XL STRL (DRAPES) ×3 IMPLANT
DRIVER NDL LRG 8 DVNC XI (INSTRUMENTS) ×3 IMPLANT
DRIVER NDL MEGA SUTCUT DVNCXI (INSTRUMENTS) ×3 IMPLANT
DRIVER NDLE LRG 8 DVNC XI (INSTRUMENTS) ×2
DRIVER NDLE MEGA SUTCUT DVNCXI (INSTRUMENTS) ×2
ELECT REM PT RETURN 9FT ADLT (ELECTROSURGICAL) ×2
ELECTRODE REM PT RTRN 9FT ADLT (ELECTROSURGICAL) ×3 IMPLANT
FORCEPS BPLR 8 MD DVNC XI (FORCEP) ×3 IMPLANT
GAUZE 4X4 16PLY ~~LOC~~+RFID DBL (SPONGE) IMPLANT
GLOVE BIOGEL PI IND STRL 6.5 (GLOVE) ×12 IMPLANT
GLOVE BIOGEL PI IND STRL 7.0 (GLOVE) ×6 IMPLANT
GLOVE ECLIPSE 6.0 STRL STRAW (GLOVE) ×9 IMPLANT
GOWN STRL REUS W/TWL LRG LVL3 (GOWN DISPOSABLE) ×3 IMPLANT
GRASPER TIP-UP FEN DVNC XI (INSTRUMENTS) ×3 IMPLANT
HOLDER FOLEY CATH W/STRAP (MISCELLANEOUS) ×3 IMPLANT
IRRIG SUCT STRYKERFLOW 2 WTIP (MISCELLANEOUS) ×2
IRRIGATION SUCT STRKRFLW 2 WTP (MISCELLANEOUS) ×3 IMPLANT
KIT PINK PAD W/HEAD ARE REST (MISCELLANEOUS) ×2
KIT PINK PAD W/HEAD ARM REST (MISCELLANEOUS) ×3 IMPLANT
KIT TURNOVER CYSTO (KITS) ×3 IMPLANT
LEGGING LITHOTOMY PAIR STRL (DRAPES) ×3 IMPLANT
MANIFOLD NEPTUNE II (INSTRUMENTS) ×3 IMPLANT
MANIPULATOR ADVINCU DEL 2.5 PL (MISCELLANEOUS) IMPLANT
MANIPULATOR ADVINCU DEL 3.0 PL (MISCELLANEOUS) IMPLANT
MANIPULATOR ADVINCU DEL 3.5 PL (MISCELLANEOUS) IMPLANT
MANIPULATOR ADVINCU DEL 4.0 PL (MISCELLANEOUS) IMPLANT
MESH VERTESSA LITE -Y 2X4X3 (Mesh General) IMPLANT
NDL INSUFFLATION 14GA 120MM (NEEDLE) ×3 IMPLANT
NEEDLE INSUFFLATION 14GA 120MM (NEEDLE) ×2
OBTURATOR OPTICAL STND 8 DVNC (TROCAR) ×2
OBTURATOR OPTICALSTD 8 DVNC (TROCAR) ×3 IMPLANT
PACK CYSTO (CUSTOM PROCEDURE TRAY) ×3 IMPLANT
PACK ROBOT WH (CUSTOM PROCEDURE TRAY) ×3 IMPLANT
PACK ROBOTIC GOWN (GOWN DISPOSABLE) ×3 IMPLANT
PAD OB MATERNITY 4.3X12.25 (PERSONAL CARE ITEMS) ×3 IMPLANT
PAD PREP 24X48 CUFFED NSTRL (MISCELLANEOUS) ×3 IMPLANT
POUCH LAPAROSCOPIC INSTRUMENT (MISCELLANEOUS) IMPLANT
PROTECTOR NERVE ULNAR (MISCELLANEOUS) ×3 IMPLANT
SCISSORS MNPLR CVD DVNC XI (INSTRUMENTS) ×3 IMPLANT
SCRUB CHG 4% DYNA-HEX 4OZ (MISCELLANEOUS) ×3 IMPLANT
SEAL UNIV 5-12 XI (MISCELLANEOUS) ×15 IMPLANT
SEALER VESSEL EXT DVNC XI (MISCELLANEOUS) IMPLANT
SET IRRIG Y TYPE TUR BLADDER L (SET/KITS/TRAYS/PACK) ×3 IMPLANT
SET TUBE SMOKE EVAC HIGH FLOW (TUBING) ×3 IMPLANT
SLEEVE SCD COMPRESS KNEE MED (STOCKING) ×3 IMPLANT
SPIKE FLUID TRANSFER (MISCELLANEOUS) ×6 IMPLANT
SUT GORETEX NAB #0 THX26 36IN (SUTURE) ×3 IMPLANT
SUT MNCRL AB 4-0 PS2 18 (SUTURE) ×3 IMPLANT
SUT MON AB 2-0 SH 27 (SUTURE) ×3 IMPLANT
SUT V-LOC BARB 180 2/0GR9 GS23 (SUTURE) ×4
SUT VIC AB 0 CT1 27 (SUTURE) ×4
SUT VIC AB 0 CT1 27XBRD ANTBC (SUTURE) ×6 IMPLANT
SUT VLOC 180 0 9IN GS21 (SUTURE) IMPLANT
SUT VLOC 180 2-0 9IN GS21 (SUTURE) IMPLANT
SUTURE V-LC BRB 180 2/0GR9GS23 (SUTURE) ×6 IMPLANT
SYR TOOMEY IRRIG 70ML (MISCELLANEOUS) ×2
SYRINGE TOOMEY IRRIG 70ML (MISCELLANEOUS) IMPLANT
TOWEL OR 17X24 6PK STRL BLUE (TOWEL DISPOSABLE) ×3 IMPLANT

## 2023-06-20 NOTE — Transfer of Care (Signed)
Immediate Anesthesia Transfer of Care Note  Patient: [name]  Procedure(s) Performed: XI ROBOTIC ASSISTED LAPAROSCOPIC SACROCOLPOPEXY (Pelvis) CYSTOSCOPY (Bladder)  Patient Location: PACU  Anesthesia Type:General  Level of Consciousness: drowsy and patient cooperative  Airway & Oxygen Therapy: Patient Spontanous Breathing and Patient connected to nasal cannula oxygen  Post-op Assessment: Report given to RN and Post -op Vital signs reviewed and stable  Post vital signs: Reviewed and stable  Last Vitals:  Vitals Value Taken Time  BP 124/76 06/20/23 1112  Temp    Pulse 65 06/20/23 1113  Resp 20 06/20/23 1113  SpO2 100 % 06/20/23 1113  Vitals shown include unfiled device data.  Last Pain:  Vitals:   06/20/23 0710  TempSrc: Oral  PainSc: 0-No pain      Patients Stated Pain Goal: 5 (06/20/23 0710)  Complications: No notable events documented.

## 2023-06-20 NOTE — Anesthesia Postprocedure Evaluation (Signed)
Anesthesia Post Note  Patient: [name]  Procedure(s) Performed: XI ROBOTIC ASSISTED LAPAROSCOPIC SACROCOLPOPEXY (Pelvis) CYSTOSCOPY (Bladder)     Patient location during evaluation: PACU Anesthesia Type: General Level of consciousness: awake and alert Pain management: pain level controlled Vital Signs Assessment: post-procedure vital signs reviewed and stable Respiratory status: spontaneous breathing, nonlabored ventilation, respiratory function stable and patient connected to nasal cannula oxygen Cardiovascular status: blood pressure returned to baseline and stable Postop Assessment: no apparent nausea or vomiting Anesthetic complications: no  No notable events documented.  Last Vitals:  Vitals:   06/20/23 1245 06/20/23 1330  BP: 102/72 97/76  Pulse: 64 84  Resp: 16 17  Temp: (!) 36.4 C (!) 36.3 C  SpO2: 93% 99%    Last Pain:  Vitals:   06/20/23 1330  TempSrc:   PainSc: 0-No pain                 Max Romano S

## 2023-06-20 NOTE — Anesthesia Preprocedure Evaluation (Signed)
Anesthesia Evaluation  Patient identified by MRN, date of birth, ID band Patient awake    Reviewed: Allergy & Precautions, H&P , NPO status , Patient's Chart, lab work & pertinent test results  Airway Mallampati: II  TM Distance: <3 FB Neck ROM: Full    Dental no notable dental hx.    Pulmonary sleep apnea , Current Smoker and Patient abstained from smoking.   Pulmonary exam normal breath sounds clear to auscultation       Cardiovascular hypertension, Normal cardiovascular exam Rhythm:Regular Rate:Normal     Neuro/Psych negative neurological ROS  negative psych ROS   GI/Hepatic Neg liver ROS,GERD  ,,  Endo/Other  negative endocrine ROSdiabetes, Type 2    Renal/GU negative Renal ROS  negative genitourinary   Musculoskeletal negative musculoskeletal ROS (+)    Abdominal   Peds negative pediatric ROS (+)  Hematology negative hematology ROS (+)   Anesthesia Other Findings   Reproductive/Obstetrics negative OB ROS                             Anesthesia Physical Anesthesia Plan  ASA: 3  Anesthesia Plan: General   Post-op Pain Management: Tylenol PO (pre-op)* and Gabapentin PO (pre-op)*   Induction: Intravenous  PONV Risk Score and Plan: 2 and Ondansetron, Dexamethasone and Treatment may vary due to age or medical condition  Airway Management Planned: Oral ETT  Additional Equipment:   Intra-op Plan:   Post-operative Plan: Extubation in OR  Informed Consent: I have reviewed the patients History and Physical, chart, labs and discussed the procedure including the risks, benefits and alternatives for the proposed anesthesia with the patient or authorized representative who has indicated his/her understanding and acceptance.     Dental advisory given  Plan Discussed with: CRNA and Surgeon  Anesthesia Plan Comments:        Anesthesia Quick Evaluation

## 2023-06-20 NOTE — Op Note (Signed)
Operative Note  Preoperative Diagnosis: anterior vaginal prolapse, posterior vaginal prolapse, and vaginal vault prolapse after hysterectomy  Postoperative Diagnosis: same  Procedures performed:  Robotic assisted sacrocolpopexy Lorna Few Lite Y), cystoscopy  Implants:  Implant Name Type Inv. Item Serial No. Manufacturer Lot No. LRB No. Used Action  MESH Grayland Ormond [id number] Mesh General MESH Arlys John  [facility] MEDICAL [id number] N/A 1 Implanted    Attending Surgeon: Lanetta Inch, MD  Assistant Surgeon: Jay Schlichter, MD  Anesthesia: General endotracheal  Findings: 1. On vaginal exam, stage III prolapse noted  2. On laparoscopy, filmy adhesions of bowel to right pelvic sidewall  3. On cystoscopy, normal bladder and urethra without injury, lesion or foreign body. Brisk bilateral ureteral efflux.   Specimens: none  Estimated blood loss: 25 mL  IV fluids: 950 mL  Urine output: 100 mL  Complications: none  Procedure in Detail: After informed consent was obtained, the patient was taken to the operating room, where general anesthesia was induced and found to be adequate. She was placed in dorsolithotomy position in yellowfin stirrups. Her hips were noted not to be hyperflexed or hyperextended. Her arms were padded with gel pads and tucked to her sides. Her hands were surrounded by foam. A padded strap was placed across her chest with foam between the pad and her skin. She was noted to be appropriately positioned with all pressure points well padded and off tension. A tilt test showed no slippage. She was prepped and draped in the usual sterile fashion.  A sterile Foley catheter was inserted.   0.25% plain Marcaine was injected at Palmer's point in the left upper quadrant and an incision was made with a scalpel. A Veress needle was inserted into the incision, CO2 insufflation was started, a low opening pressure was noted, and pneumoperitoneum was obtained. The  Veress needle was removed and a 5mm optiview trocar was placed with direct visualization. Entry into the peritoneal cavity was confirmed. No significant adhesions were noted. Local anesthetic was injected above the umbilicus and a port was placed under direct visualization.  After determining placement for the other ports, Local anesthetic was injected at each site and two 8 mm incisions were made for robotic ports at 10 cm lateral to and at the level of the umbilical port. Two additional 8 mm incisions were made 10 cm lateral to these and 30 degrees down followed by 8 mm robotic ports - the right side for an assistant port. All trocars were placed sequentially under direct visualization of the camera. The patient was placed in Trendelenburg. The sacrum appeared to be free of any adhesive disease.The robot was docked on the patient's right side. Monopolar endoshears were placed in the right arm, a [place] bipolar grasper was placed in the 2nd arm of the patient's left side, and a Tip up grasper was placed in the 3rd arm on the patient's left side.    The bowel was attached to the right pelvic sidewall with filmy adhesions and these were removed with scissors.  Attention was then turned to the sacral promontory. The peritoneum overlying the sacral promontory was tented up, dissected sharply with monopolar scissors and electrosurgery using layer by layer technique. The overlying areolar and adipose tissue were taken down until the anterior longitudinal sacral ligament was identified. Two interrupted transverse stitches of CV2 Gortex was attached to the anterior longitudinal ligament. The peritoneal incision was extended down to the posterior cul-de-sac. This was performed with care to avoid  the ureter on the right side and the sigmoid colon and its mesentary on the left side. With a lucite probe in the vagina, anterior vaginal dissection was then performed with sharp dissection and electrosurgery to separate the  vesicovaginal space to the level of the urethra. The posterior vaginal dissection was then performed with sharp dissection and electrosurgery in order to dissect the rectum away from the posterior vagina.  Small vessels were cauterized along the way to obtain excellent hemostasis. A "Y" mesh was then inserted into the abdomen after trimming to appropriate size. With the probe in the vagina, the anterior leaf of the Y mesh was affixed to the anterior portion of the vagina using a 2-0 v-loc suture in a spiral pattern to distribute the suture evenly across the surface of the anterior mesh leaf. In a similar fashion, the posterior leaf of the Y mesh was attached to the posterior surface of the vagina with 2-0 v-loc suture.  The distal end of the mesh was then brought to overlie the sacrum. The correct amount of tension was determined in order to elevate the vagina, but not put the mesh under tension. The distal end of the mesh was then affixed to the anterior longitudinal sacral ligament using the previously placed gortex sutures. The excess distal mesh was then cut and removed. The peritoneum was reapproximated over the mesh using 2-0 monocryl. The bladder flap was incorporated to completely retroperitonealize the mesh. All pedicles were carefully inspected and noted to be hemostatic as the CO2 gas was deflated. All instruments were removed from the patient's abdomen.    The Foley catheter was removed.  A 70-degree cystoscope was introduced, and 360-degree inspection revealed no injury, lesion or foreign body in the bladder. Brisk bilateral ureteral efflux was noted with the assistance of pyridium.  The bladder was drained and the cystoscope was removed.  The Foley catheter was replaced.   The robot was undocked. The CO2 gas was removed and the ports were removed.  The skin incisions were closed with subcutaneous stitches of 4-0 Monocryl and covered with skin glue.    The patient tolerated the procedure well.  Sponge, lap, and needle counts were correct x 2. She was awakened from anesthesia and transferred to the recovery room in stable condition.   Marguerita Beards, MD

## 2023-06-20 NOTE — H&P (Signed)
[facility] Pre-Operative H&P  Subjective Chief Complaint: [name] presents for a preoperative encounter.   History of Present Illness: [name] is a 67 y.o. female who presents for preoperative visit.  She is scheduled to undergo Exam under anesthesia, robotic assisted sacrolcopoexy, cystoscopy, possible vaginal repair if robotic surgery not possible on 06/20/23.  Her symptoms include vaginal bulge, and she was was found to have Stage III anterior, Stage I posterior, Stage I apical prolapse.    Urodynamic Impression:  1. Sensation was increased; capacity was normal 2. Stress Incontinence was not demonstrated at normal pressures; 3. Detrusor Overactivity was demonstrated without leakage. 4. Emptying was normal with a normal PVR, a sustained detrusor contraction present,  abdominal straining not present, dyssynergic urethral sphincter activity on EMG.  Past Medical History:  Diagnosis Date   Breast cancer [facility]) 2011   right breast with axillary lymph node removal   Chronic kidney disease stage 1    Diabetes mellitus, type II (HCC)    type 2   GERD (gastroesophageal reflux disease)    History of COVID-19 05/01/2023   Hypertension    Osteoarthritis of both knees    Personal history of chemotherapy    Personal history of radiation therapy    Pneumonia    in early 1980's   Wears glasses      Past Surgical History:  Procedure Laterality Date   ABDOMINAL HYSTERECTOMY  1979   APPENDECTOMY  1978   BLADDER SUSPENSION     BREAST LUMPECTOMY Right 2011   JOINT REPLACEMENT     bil knees   KNEE ARTHROPLASTY Left 06/29/2016   Procedure: LEFT TOTAL KNEE WITH NAVIGATION;  Surgeon: Samson Frederic, MD;  Location: [facility];  Service: Orthopedics;  Laterality: Left;   KNEE ARTHROPLASTY Right 12/14/2016   Procedure: RIGHT TOTAL KNEE ARTHROPLASTY WITH COMPUTER NAVIGATION;  Surgeon: Samson Frederic, MD;  Location: [facility];  Service: Orthopedics;  Laterality: Right;   Needs RNFA   LUMBAR LAMINECTOMY/DECOMPRESSION MICRODISCECTOMY N/A 08/23/2018   Procedure: L3-4 DECOMPRESSION, REMOVAL OF INTRASPINAL EXTRADURAL FACET CYST;  Surgeon: Eldred Manges, MD;  Location: [facility];  Service: Orthopedics;  Laterality: N/A;   MENISCUS REPAIR     right knee   OOPHORECTOMY      has No Known Allergies.   Family History  Adopted: Yes  Problem Relation Age of Onset   Breast cancer Maternal Aunt     Social History   Tobacco Use   Smoking status: Some Days    Current packs/day: 0.00    Average packs/day: 0.5 packs/day for 20.0 years (10.0 ttl pk-yrs)    Types: Cigarettes    Start date: 10/14/1993    Last attempt to quit: 10/14/2013    Years since quitting: 9.6   Smokeless tobacco: Never  Vaping Use   Vaping status: Never Used  Substance Use Topics   Alcohol use: Not Currently   Drug use: Yes    Comment: thc gummies prn 2 x week     Review of Systems was negative for a full 10 system review except as noted in the History of Present Illness.   Current Facility-Administered Medications:    ceFAZolin (ANCEF) IVPB 2g/100 mL premix, 2 g, Intravenous, On Call to OR, Marguerita Beards, MD   lactated ringers infusion, , Intravenous, Continuous, Woodrum, Chelsey L, MD, Last Rate: 50 mL/hr at 06/20/23 0741, New Bag at 06/20/23 0741   mupirocin ointment (BACTROBAN) 2 % 1 Application, 1 Application, Nasal, BID, Florian Buff,  Rochel Brome, MD   povidone-iodine 10 % swab 2 Application, 2 Application, Topical, Once, Marguerita Beards, MD   Objective Vitals:   06/20/23 0710  BP: 122/81  Pulse: (!) 58  Resp: 17  Temp: 97.7 F (36.5 C)  SpO2: 99%    Gen: NAD CV: S1 S2 RRR Lungs: Clear to auscultation bilaterally Abd: soft, nontender   Previous Pelvic Exam showed: POP-Q   1.5                                            Aa   1.5                                           Ba   -2                                              C    4                                             Gh   3                                            Pb   6.5                                            tvl    -2                                            Ap   -2                                            Bp                                                  D       Assessment/ Plan  Assessment: The patient is a 67 y.o. year old scheduled to undergo Exam under anesthesia, robotic assisted sacrolcopoexy, cystoscopy, possible vaginal repair if robotic surgery not possible.   Plan: General Surgical Consent: The patient has previously been counseled on alternative treatments, and the decision by the patient and provider was to proceed with the procedure listed above.  For all procedures, there are risks of bleeding, infection, damage to surrounding organs including but not limited to bowel, bladder, blood vessels, ureters and nerves, and need for further surgery if an  injury were to occur. These risks are all low with minimally invasive surgery.   There are risks of numbness and weakness at any body site or buttock/rectal pain.  It is possible that baseline pain can be worsened by surgery, either with or without mesh. If surgery is vaginal, there is also a low risk of possible conversion to laparoscopy or open abdominal incision where indicated. Very rare risks include blood transfusion, blood clot, heart attack, pneumonia, or death.   There is also a risk of short-term postoperative urinary retention with need to use a catheter. About half of patients need to go home from surgery with a catheter, which is then later removed in the office. The risk of long-term need for a catheter is very low. There is also a risk of worsening of overactive bladder.    Prolapse (with or without mesh): Risk factors for surgical failure  include things that put pressure on your pelvis and the surgical repair, including obesity, chronic cough, and heavy lifting or straining (including  lifting children or adults, straining on the toilet, or lifting heavy objects such as furniture or anything weighing >25 lbs. Risks of recurrence is 20-30% with vaginal native tissue repair and a less than 10% with sacrocolpopexy with mesh.    Sacrocolpopexy: Mesh implants may provide more prolapse support, but do have some unique risks to consider. It is important to understand that mesh is permanent and cannot be easily removed. Risks of abdominal sacrocolpopexy mesh include mesh exposure (~3-6%), painful intercourse (recent studies show lower rates after surgery compared to before, with ~5-8% risk of new onset), and very rare risks of bowel or bladder injury or infection (<1%). The risk of mesh exposure is more likely in a woman with risks for poor healing (prior radiation, poorly controlled diabetes, or immunocompromised). The risk of new or worsened chronic pain after mesh implant is more common in women with baseline chronic pain and/or poorly controlled anxiety or depression. There is an FDA safety notification on vaginal mesh procedures for prolapse but NOT abdominal mesh procedures and therefore does not apply to your surgery. We have extensive experience and training with mesh placement and we have close postoperative follow up to identify any potential complications from mesh.    We discussed consent for blood products. Risks for blood transfusion include allergic reactions, other reactions that can affect different body organs and managed accordingly, transmission of infectious diseases such as HIV or Hepatitis. However, the blood is screened. Patient consents for blood products.  Marguerita Beards, MD

## 2023-06-20 NOTE — Anesthesia Procedure Notes (Signed)
Procedure Name: Intubation Date/Time: 06/20/2023 8:38 AM  Performed by: Earmon Phoenix, CRNAPre-anesthesia Checklist: Patient identified, Emergency Drugs available, Suction available, Patient being monitored and Timeout performed Patient Re-evaluated:Patient Re-evaluated prior to induction Oxygen Delivery Method: Circle system utilized Preoxygenation: Pre-oxygenation with 100% oxygen Induction Type: IV induction Ventilation: Mask ventilation without difficulty Laryngoscope Size: Mac and 4 Grade View: Grade I Tube type: Oral Tube size: 7.0 mm Number of attempts: 1 Airway Equipment and Method: Stylet Placement Confirmation: ETT inserted through vocal cords under direct vision, positive ETCO2 and breath sounds checked- equal and bilateral Secured at: 22 cm Tube secured with: Tape Dental Injury: Teeth and Oropharynx as per pre-operative assessment

## 2023-06-20 NOTE — Discharge Instructions (Addendum)

## 2023-06-21 ENCOUNTER — Encounter (HOSPITAL_BASED_OUTPATIENT_CLINIC_OR_DEPARTMENT_OTHER): Payer: Self-pay | Admitting: Obstetrics and Gynecology

## 2023-07-12 ENCOUNTER — Encounter: Payer: Self-pay | Admitting: Obstetrics and Gynecology

## 2023-07-17 DIAGNOSIS — H2513 Age-related nuclear cataract, bilateral: Secondary | ICD-10-CM | POA: Diagnosis not present

## 2023-07-17 LAB — HM DIABETES EYE EXAM

## 2023-08-01 ENCOUNTER — Ambulatory Visit (INDEPENDENT_AMBULATORY_CARE_PROVIDER_SITE_OTHER): Payer: Medicare Other | Admitting: Obstetrics and Gynecology

## 2023-08-01 ENCOUNTER — Encounter: Payer: Self-pay | Admitting: Obstetrics and Gynecology

## 2023-08-01 VITALS — BP 98/65 | HR 75

## 2023-08-01 DIAGNOSIS — Z48816 Encounter for surgical aftercare following surgery on the genitourinary system: Secondary | ICD-10-CM | POA: Diagnosis not present

## 2023-08-01 DIAGNOSIS — Z9889 Other specified postprocedural states: Secondary | ICD-10-CM

## 2023-08-01 NOTE — Progress Notes (Signed)
[name]  Date of Visit: 08/01/2023  History of Present Illness: Ms. Kritz is a 67 y.o. female scheduled today for a post-operative visit.   Surgery: s/p Robotic assisted sacrocolpopexy Lorna Few Lite Y), cystoscopy on 06/1823  She passed her postoperative void trial.   Postoperative course has been uncomplicated.   Today she reports she is feeling well. Has some feeling of fullness in the vagina, but nothing coming out.   UTI in the last 6 weeks? No  Pain? No  She has returned to her normal activity (except for postop restrictions) Vaginal bulge?  maybe Stress incontinence: No  Urgency/frequency: No  Urge incontinence: No  Voiding dysfunction: No  Bowel issues: No   Subjective Success: Do you usually have a bulge or something falling out that you can see or feel in the vaginal area? Yes  Retreatment Success: Any retreatment with surgery or pessary for any compartment? No    Medications: She has a current medication list which includes the following prescription(s): acetaminophen, biotin, clobetasol ointment, cyanocobalamin, estradiol, ibuprofen, jardiance, losartan, magnesium, nystatin-triamcinolone ointment, oxycodone, polyethylene glycol powder, spironolactone, [DISCONTINUED] olmesartan-amlodipine-hctz, and [DISCONTINUED] potassium chloride er.   Allergies: Patient has No Known Allergies.   Physical Exam: BP 98/65   Pulse 75   Abdomen: soft, non-tender, without masses or organomegaly Laparoscopic Incisions: healing well.  Pelvic Examination: Vagina:  No tenderness along the anterior or posterior vagina. No apical tenderness. No pelvic masses. No visible or palpable mesh.  POP-Q: POP-Q  -1                                            Aa   -1                                           Ba  -7                                              C   4                                            Gh  5                                            Pb  7.5                                             tvl   -3                                            Ap  -3  Bp                                                 D     ---------------------------------------------------------  Assessment and Plan:  1. Post-operative state     - Healing well. Mild anterior wall prolapse which is not bothersome.  - Can resume regular activity including exercise and intercourse,  if desired.  - Discussed avoidance of heavy lifting and straining long term to reduce the risk of recurrence.   Plan to reexamine in 6 months or sooner if needed  Marguerita Beards, MD

## 2023-08-12 ENCOUNTER — Other Ambulatory Visit: Payer: Self-pay | Admitting: Internal Medicine

## 2023-10-08 ENCOUNTER — Other Ambulatory Visit: Payer: Self-pay | Admitting: Internal Medicine

## 2023-10-08 DIAGNOSIS — E119 Type 2 diabetes mellitus without complications: Secondary | ICD-10-CM

## 2023-11-20 ENCOUNTER — Other Ambulatory Visit: Payer: Self-pay | Admitting: Internal Medicine

## 2023-11-20 DIAGNOSIS — Z1231 Encounter for screening mammogram for malignant neoplasm of breast: Secondary | ICD-10-CM

## 2023-12-06 ENCOUNTER — Encounter: Payer: Self-pay | Admitting: Internal Medicine

## 2023-12-06 ENCOUNTER — Ambulatory Visit: Payer: Medicare Other | Admitting: Internal Medicine

## 2023-12-06 ENCOUNTER — Ambulatory Visit
Admission: RE | Admit: 2023-12-06 | Discharge: 2023-12-06 | Disposition: A | Discharge status: Home / Self Care | Payer: Medicare Other | Source: Ambulatory Visit | Attending: Internal Medicine | Admitting: Internal Medicine

## 2023-12-06 VITALS — BP 110/76 | HR 60 | Temp 98.4°F | Ht 65.5 in | Wt 173.6 lb

## 2023-12-06 DIAGNOSIS — I10 Essential (primary) hypertension: Secondary | ICD-10-CM | POA: Diagnosis not present

## 2023-12-06 DIAGNOSIS — Z0000 Encounter for general adult medical examination without abnormal findings: Secondary | ICD-10-CM

## 2023-12-06 DIAGNOSIS — E785 Hyperlipidemia, unspecified: Secondary | ICD-10-CM

## 2023-12-06 DIAGNOSIS — F17210 Nicotine dependence, cigarettes, uncomplicated: Secondary | ICD-10-CM | POA: Diagnosis not present

## 2023-12-06 DIAGNOSIS — E1169 Type 2 diabetes mellitus with other specified complication: Secondary | ICD-10-CM

## 2023-12-06 DIAGNOSIS — E118 Type 2 diabetes mellitus with unspecified complications: Secondary | ICD-10-CM | POA: Diagnosis not present

## 2023-12-06 DIAGNOSIS — Z720 Tobacco use: Secondary | ICD-10-CM

## 2023-12-06 DIAGNOSIS — Z1231 Encounter for screening mammogram for malignant neoplasm of breast: Secondary | ICD-10-CM | POA: Diagnosis not present

## 2023-12-06 DIAGNOSIS — Z7984 Long term (current) use of oral hypoglycemic drugs: Secondary | ICD-10-CM | POA: Diagnosis not present

## 2023-12-06 LAB — LIPID PANEL
Cholesterol: 177 mg/dL (ref 0–200)
HDL: 54.2 mg/dL (ref >39.00)
LDL Cholesterol: 99 mg/dL (ref 0–99)
NonHDL: 122.94
Total CHOL/HDL Ratio: 3
Triglycerides: 118 mg/dL (ref 0.0–149.0)
VLDL: 23.6 mg/dL (ref 0.0–40.0)

## 2023-12-06 LAB — CBC
HCT: 46 % (ref 36.0–46.0)
Hemoglobin: 14.7 g/dL (ref 12.0–15.0)
MCHC: 31.9 g/dL (ref 30.0–36.0)
MCV: 89.3 fl (ref 78.0–100.0)
Platelets: 364 10*3/uL (ref 150.0–400.0)
RBC: 5.15 Mil/uL — ABNORMAL HIGH (ref 3.87–5.11)
RDW: 14.5 % (ref 11.5–15.5)
WBC: 5.2 10*3/uL (ref 4.0–10.5)

## 2023-12-06 LAB — COMPREHENSIVE METABOLIC PANEL
ALT: 20 U/L (ref 0–35)
AST: 22 U/L (ref 0–37)
Albumin: 4.8 g/dL (ref 3.5–5.2)
Alkaline Phosphatase: 69 U/L (ref 39–117)
BUN: 20 mg/dL (ref 6–23)
CO2: 28 meq/L (ref 19–32)
Calcium: 10.6 mg/dL — ABNORMAL HIGH (ref 8.4–10.5)
Chloride: 102 meq/L (ref 96–112)
Creatinine, Ser: 1.35 mg/dL — ABNORMAL HIGH (ref 0.40–1.20)
GFR: 40.55 mL/min — ABNORMAL LOW (ref >60.00)
Glucose, Bld: 101 mg/dL — ABNORMAL HIGH (ref 70–99)
Potassium: 4.8 meq/L (ref 3.5–5.1)
Sodium: 137 meq/L (ref 135–145)
Total Bilirubin: 0.7 mg/dL (ref 0.2–1.2)
Total Protein: 8.4 g/dL — ABNORMAL HIGH (ref 6.0–8.3)

## 2023-12-06 LAB — MICROALBUMIN / CREATININE URINE RATIO
Creatinine,U: 63.9 mg/dL
Microalb Creat Ratio: 12.5 mg/g (ref 0.0–30.0)
Microalb, Ur: 0.8 mg/dL (ref 0.0–1.9)

## 2023-12-06 LAB — HEMOGLOBIN A1C: Hgb A1c MFr Bld: 6.9 % — ABNORMAL HIGH (ref 4.6–6.5)

## 2023-12-06 NOTE — Assessment & Plan Note (Signed)
 For some reason simvastatin has fallen off med list since last visit. Checking lipid panel and resume if appropriate.

## 2023-12-06 NOTE — Patient Instructions (Signed)
 We will check the labs today.

## 2023-12-06 NOTE — Progress Notes (Signed)
 Subjective:   Patient ID: [name], female    DOB: [birth year], 68 y.o.   MRN: [id number]  HPI Here for medicare wellness and physical, no new complaints. Please see A/P for status and treatment of chronic medical problems.   Diet: DM since diabetic Physical activity: sedentary Depression/mood screen: negative Hearing: intact to whispered voice Visual acuity: grossly normal, performs annual eye exam  ADLs: capable Fall risk: none Home safety: good Cognitive evaluation: intact to orientation, naming, recall and repetition EOL planning: adv directives discussed, in place  [employer] Visit from 12/06/2023 in [facility] [place]  PHQ-2 Total Score 0       Flowsheet Row Office Visit from 09/12/2022 in [facility]  PHQ-9 Total Score 0         09/26/2020   11:54 AM 09/08/2022   11:05 AM 09/12/2022    9:15 AM 03/05/2023    3:51 PM 12/06/2023    8:46 AM  Fall Risk  Falls in the past year?  0 0 0 0  Was there an injury with Fall?  0 0 0 0  Fall Risk Category Calculator  0 0 0 0  Fall Risk Category (Retired)  Low Low    (RETIRED) Patient Fall Risk Level Low fall risk Low fall risk     Patient at Risk for Falls Due to  No Fall Risks  No Fall Risks No Fall Risks  Fall risk Follow up  Falls evaluation completed Falls evaluation completed Falls evaluation completed Falls evaluation completed    I have personally reviewed and have noted 1. The patient's medical and social history - reviewed today no changes 2. Their use of alcohol, tobacco or illicit drugs 3. Their current medications and supplements 4. The patient's functional ability including ADL's, fall risks, home safety risks and hearing or visual impairment. 5. Diet and physical activities 6. Evidence for depression or mood disorders 7. Care team reviewed and updated 8.  The patient is not on an opioid pain medication  Patient Care Team: Myrlene Broker, MD as PCP - General (Internal Medicine) Past Medical History:  Diagnosis Date   Breast cancer [facility]) 2011   right breast with axillary lymph node removal   Chronic kidney disease stage 1    Diabetes mellitus, type II (HCC)    type 2   GERD (gastroesophageal reflux disease)    History of COVID-19 05/01/2023   Hypertension    Osteoarthritis of both knees    Personal history of chemotherapy    Personal history of radiation therapy    Pneumonia    in early 1980's   Wears glasses    Past Surgical History:  Procedure Laterality Date   ABDOMINAL HYSTERECTOMY  1979   APPENDECTOMY  1978   BLADDER SUSPENSION     BREAST LUMPECTOMY Right 2011   CYSTOSCOPY N/A 06/20/2023   Procedure: CYSTOSCOPY;  Surgeon: Marguerita Beards, MD;  Location: [facility];  Service: Gynecology;  Laterality: N/A;   JOINT REPLACEMENT     bil knees   KNEE ARTHROPLASTY Left 06/29/2016   Procedure: LEFT TOTAL KNEE WITH NAVIGATION;  Surgeon: Samson Frederic, MD;  Location: [facility];  Service: Orthopedics;  Laterality: Left;   KNEE ARTHROPLASTY Right 12/14/2016   Procedure: RIGHT TOTAL KNEE ARTHROPLASTY WITH COMPUTER NAVIGATION;  Surgeon: Samson Frederic, MD;  Location: [facility];  Service: Orthopedics;  Laterality: Right;  Needs RNFA   LUMBAR LAMINECTOMY/DECOMPRESSION  MICRODISCECTOMY N/A 08/23/2018   Procedure: L3-4 DECOMPRESSION, REMOVAL OF INTRASPINAL EXTRADURAL FACET CYST;  Surgeon: Eldred Manges, MD;  Location: [facility];  Service: Orthopedics;  Laterality: N/A;   MENISCUS REPAIR     right knee   OOPHORECTOMY     ROBOTIC ASSISTED LAPAROSCOPIC SACROCOLPOPEXY N/A 06/20/2023   Procedure: XI ROBOTIC ASSISTED LAPAROSCOPIC SACROCOLPOPEXY;  Surgeon: Marguerita Beards, MD;  Location: [facility];  Service: Gynecology;  Laterality: N/A;   Family History  Adopted: Yes  Problem Relation Age of Onset   Breast cancer Maternal Aunt      Review of Systems  Constitutional: Negative.    HENT: Negative.    Eyes: Negative.   Respiratory:  Negative for cough, chest tightness and shortness of breath.   Cardiovascular:  Negative for chest pain, palpitations and leg swelling.  Gastrointestinal:  Negative for abdominal distention, abdominal pain, constipation, diarrhea, nausea and vomiting.  Musculoskeletal: Negative.   Skin: Negative.   Neurological: Negative.   Psychiatric/Behavioral: Negative.      Objective:  Physical Exam Constitutional:      Appearance: She is well-developed.  HENT:     Head: Normocephalic and atraumatic.  Cardiovascular:     Rate and Rhythm: Normal rate and regular rhythm.  Pulmonary:     Effort: Pulmonary effort is normal. No respiratory distress.     Breath sounds: Normal breath sounds. No wheezing or rales.  Abdominal:     General: Bowel sounds are normal. There is no distension.     Palpations: Abdomen is soft.     Tenderness: There is no abdominal tenderness. There is no rebound.  Musculoskeletal:     Cervical back: Normal range of motion.  Skin:    General: Skin is warm and dry.  Neurological:     Mental Status: She is alert and oriented to person, place, and time.     Coordination: Coordination normal.     Vitals:   12/06/23 0837  BP: 110/76  Pulse: 60  Temp: 98.4 F (36.9 C)  SpO2: 96%  Weight: 173 lb 9.6 oz (78.7 kg)  Height: 5' 5.5" (1.664 m)    Assessment & Plan:

## 2023-12-06 NOTE — Assessment & Plan Note (Signed)
 Flu shot up to date. Pneumonia complete. Shingrix due at pharmacy. Tetanus up to date. Colonoscopy up to date. Mammogram up to date, pap smear aged out and dexa complete. Counseled about sun safety and mole surveillance. Counseled about the dangers of distracted driving. Given 10 year screening recommendations.

## 2023-12-06 NOTE — Assessment & Plan Note (Signed)
 Time spent counseling about tobacco usage: 6 minutes. I have asked about smoking and is smoking same as usual. The patient is advised to quit. The patient is maybe willing to quit. They would like to try to quit in the next 6 months. We will follow up with them in 6-12 months.

## 2023-12-06 NOTE — Assessment & Plan Note (Signed)
 BP at goal on spironolactone and losartan. Checking CMP and adjust as needed.

## 2023-12-06 NOTE — Assessment & Plan Note (Signed)
 Foot exam done, checking microalbumin to creatinine ratio and HgA1c and lipid panel and CMP. Adjust jardiance 10 mg daily as needed on ARB and not on statin.

## 2023-12-10 ENCOUNTER — Other Ambulatory Visit: Payer: Self-pay | Admitting: Internal Medicine

## 2023-12-12 ENCOUNTER — Other Ambulatory Visit: Payer: Self-pay | Admitting: Internal Medicine

## 2023-12-12 DIAGNOSIS — R928 Other abnormal and inconclusive findings on diagnostic imaging of breast: Secondary | ICD-10-CM

## 2023-12-21 ENCOUNTER — Ambulatory Visit
Admission: RE | Admit: 2023-12-21 | Discharge: 2023-12-21 | Disposition: A | Discharge status: Home / Self Care | Source: Ambulatory Visit | Attending: Internal Medicine | Admitting: Internal Medicine

## 2023-12-21 ENCOUNTER — Other Ambulatory Visit: Payer: Self-pay | Admitting: Internal Medicine

## 2023-12-21 DIAGNOSIS — Z853 Personal history of malignant neoplasm of breast: Secondary | ICD-10-CM | POA: Diagnosis not present

## 2023-12-21 DIAGNOSIS — R928 Other abnormal and inconclusive findings on diagnostic imaging of breast: Secondary | ICD-10-CM

## 2023-12-21 DIAGNOSIS — N6314 Unspecified lump in the right breast, lower inner quadrant: Secondary | ICD-10-CM | POA: Diagnosis not present

## 2023-12-21 DIAGNOSIS — N6310 Unspecified lump in the right breast, unspecified quadrant: Secondary | ICD-10-CM

## 2023-12-26 ENCOUNTER — Ambulatory Visit
Admission: RE | Admit: 2023-12-26 | Discharge: 2023-12-26 | Disposition: A | Discharge status: Home / Self Care | Source: Ambulatory Visit | Attending: Internal Medicine | Admitting: Internal Medicine

## 2023-12-26 DIAGNOSIS — D0511 Intraductal carcinoma in situ of right breast: Secondary | ICD-10-CM | POA: Diagnosis not present

## 2023-12-26 DIAGNOSIS — R92321 Mammographic fibroglandular density, right breast: Secondary | ICD-10-CM | POA: Diagnosis not present

## 2023-12-26 DIAGNOSIS — N6314 Unspecified lump in the right breast, lower inner quadrant: Secondary | ICD-10-CM | POA: Diagnosis not present

## 2023-12-26 DIAGNOSIS — N6310 Unspecified lump in the right breast, unspecified quadrant: Secondary | ICD-10-CM

## 2023-12-26 HISTORY — PX: BREAST BIOPSY: SHX20

## 2023-12-27 LAB — SURGICAL PATHOLOGY

## 2023-12-28 ENCOUNTER — Telehealth: Payer: Self-pay | Admitting: *Deleted

## 2023-12-28 NOTE — Telephone Encounter (Signed)
 Spoke to patient to confirm upcoming morning [facility] clinic appointment on 14/2, paperwork will be sent via e-mail.  Gave location and time, also informed patient that the surgeon's office would be calling as well to get information from them similar to the packet that they will be receiving so make sure to do both.  Reminded patient that all providers will be coming to the clinic to see them HERE and if they had any questions to not hesitate to reach back out to myself or their navigators.

## 2023-12-31 ENCOUNTER — Encounter: Payer: Self-pay | Admitting: *Deleted

## 2023-12-31 DIAGNOSIS — D0511 Intraductal carcinoma in situ of right breast: Secondary | ICD-10-CM | POA: Insufficient documentation

## 2024-01-01 NOTE — Progress Notes (Unsigned)
 [facility] CONSULT NOTE  Patient Care Team: Myrlene Broker, MD as PCP - General (Internal Medicine) Donnelly Angelica, RN as Oncology Nurse Navigator Pershing Proud, RN as Oncology Nurse Navigator Almond Lint, MD as Consulting Physician (General Surgery) Rachel Moulds, MD as Consulting Physician (Hematology and Oncology) Dorothy Puffer, MD as Consulting Physician (Radiation Oncology)  CHIEF COMPLAINTS/PURPOSE OF CONSULTATION:  DCIS right breast.  ASSESSMENT & PLAN:  Ductal carcinoma in situ (DCIS) of right breast This is a very pleasant 68 yr old post menopausal female patient with right breast ER PR positive DCIS referred to breast [facility] fofr additional recommendations.  Pathology review: I discussed with the patient the difference between DCIS and invasive breast cancer. It is considered a precancerous lesion. DCIS is classified as a Stage 0 breast cancer. It is generally detected through mammograms as calcifications. We discussed the significance of grades and its impact on prognosis. We also discussed the importance of ER and PR receptors and their implications to adjuvant treatment options. Prognosis of DCIS dependence on grade and degree of comedo necrosis. It is anticipated that if not treated, 20-30% of DCIS can develop into invasive breast cancer.  Recommendation: 1. Breast conserving surgery 2. Followed by adjuvant radiation therapy 3. Followed by antiestrogen therapy with tamoxifen/aromatase inhibitors based on menopausal status 5 years  Tamoxifen counseling: We discussed the risks and benefits of tamoxifen. These include but not limited to insomnia, hot flashes, mood changes, vaginal dryness, and weight gain. Although rare, serious side effects including endometrial cancer, risk of blood clots were also discussed. We strongly believe that the benefits far outweigh the risks. Patient understands these risks and consented to starting treatment. Planned  treatment duration is 5 years.  Aromatase inhibitors counseling: We have discussed the mechanism of action of aromatase inhibitors today.  We have discussed adverse effects including but not limited to menopausal symptoms, increased risk of osteoporosis and fractures, cardiovascular events, arthralgias and myalgias.  We do believe that the benefits far outweigh the risks.  Plan treatment duration of 5 years.  She will return to clinic after surgery to review additional recommendations.  She is agreeable to proceeding with genetic testing given this is the second episode of breast cancer.   No orders of the defined types were placed in this encounter.    HISTORY OF PRESENTING ILLNESS:  [name] 68 y.o. female is here because of right breast DCIS.  Oncology History  Ductal carcinoma in situ (DCIS) of right breast  12/06/2023 Mammogram   Possible mass in the right breast warranting further evaluation. Suspicious mass in the right breast at 3:30 o clock measuring 8 mm   12/26/2023 Pathology Results   DCIS with necrosis and calcs present, ER and PR strongly positive.   12/31/2023 Initial Diagnosis   Ductal carcinoma in situ (DCIS) of right breast   01/02/2024 Cancer Staging   Staging form: Breast, AJCC 8th Edition - Clinical stage from 01/02/2024: Stage 0 (cTis (DCIS), cN0, cM0, ER+, PR+) - Signed by Rachel Moulds, MD on 01/02/2024 Stage prefix: Initial diagnosis Nuclear grade: G2     Discussed the use of AI scribe software for clinical note transcription with the patient, who gave verbal consent to proceed.  History of Present Illness  This is a pleasant 68 year old female patient with DCIS present to breast [facility].  She had history of breast cancer treated in 2011, grade 2 IDC which was also ER/PR positive status postlumpectomy and radiation. Antiestrogen therapy.  She  had isolated tumor cells at that she is to be here with her daughter.  She is currently very concerned about the  possibility of side effects of antiestrogen therapy.  She is otherwise very healthy, retired.  Issues today except for history of type II DM and HTN   MEDICAL HISTORY:  Past Medical History:  Diagnosis Date   Breast cancer (HCC) 2011   right breast with axillary lymph node removal   Chronic kidney disease stage 1    Diabetes mellitus, type II (HCC)    type 2   GERD (gastroesophageal reflux disease)    History of COVID-19 05/01/2023   Hypertension    Osteoarthritis of both knees    Personal history of chemotherapy    Personal history of radiation therapy    Pneumonia    in early 1980's   Wears glasses     SURGICAL HISTORY: Past Surgical History:  Procedure Laterality Date   ABDOMINAL HYSTERECTOMY  1979   APPENDECTOMY  1978   BLADDER SUSPENSION     BREAST BIOPSY Right 12/26/2023   [place] RT BREAST BX W LOC DEV 1ST LESION IMG BX SPEC US GUIDE 12/26/2023 [facility] MAMMOGRAPHY   BREAST LUMPECTOMY Right 2011   CYSTOSCOPY N/A 06/20/2023   Procedure: CYSTOSCOPY;  Surgeon: Marguerita Beards, MD;  Location: [facility];  Service: Gynecology;  Laterality: N/A;   JOINT REPLACEMENT     bil knees   KNEE ARTHROPLASTY Left 06/29/2016   Procedure: LEFT TOTAL KNEE WITH NAVIGATION;  Surgeon: Samson Frederic, MD;  Location: [facility];  Service: Orthopedics;  Laterality: Left;   KNEE ARTHROPLASTY Right 12/14/2016   Procedure: RIGHT TOTAL KNEE ARTHROPLASTY WITH COMPUTER NAVIGATION;  Surgeon: Samson Frederic, MD;  Location: [facility];  Service: Orthopedics;  Laterality: Right;  Needs RNFA   LUMBAR LAMINECTOMY/DECOMPRESSION MICRODISCECTOMY N/A 08/23/2018   Procedure: L3-4 DECOMPRESSION, REMOVAL OF INTRASPINAL EXTRADURAL FACET CYST;  Surgeon: Eldred Manges, MD;  Location: [facility];  Service: Orthopedics;  Laterality: N/A;   MENISCUS REPAIR     right knee   OOPHORECTOMY     ROBOTIC ASSISTED LAPAROSCOPIC SACROCOLPOPEXY N/A 06/20/2023   Procedure: XI ROBOTIC ASSISTED LAPAROSCOPIC SACROCOLPOPEXY;   Surgeon: Marguerita Beards, MD;  Location: [facility];  Service: Gynecology;  Laterality: N/A;    SOCIAL HISTORY: Social History   Socioeconomic History   Marital status: Married    Spouse name: Ramon Dredge   Number of children: 3   Years of education: Not on file   Highest education level: Not on file  Occupational History   Occupation: medical records    Employer: [employer]  Tobacco Use   Smoking status: Every Day    Current packs/day: 0.25    Average packs/day: 0.5 packs/day for 20.2 years (10.1 ttl pk-yrs)    Types: Cigarettes    Start date: 10/14/1993    Last attempt to quit: 10/14/2013   Smokeless tobacco: Never  Vaping Use   Vaping status: Never Used  Substance and Sexual Activity   Alcohol use: Not Currently   Drug use: Yes    Comment: thc gummies prn 2 x week   Sexual activity: Yes    Birth control/protection: Post-menopausal  Other Topics Concern   Not on file  Social History Narrative   Not on file   Social Drivers of Health   Financial Resource Strain: Low Risk  (05/14/2023)   Overall Financial Resource Strain (CARDIA)    Difficulty of Paying Living Expenses: Not hard at all  Food Insecurity: No Food Insecurity (05/14/2023)   Hunger Vital Sign    Worried About Running Out of Food in the Last Year: Never true    Ran Out of Food in the Last Year: Never true  Transportation Needs: No Transportation Needs (05/14/2023)   PRAPARE - Administrator, Civil Service (Medical): No    Lack of Transportation (Non-Medical): No  Physical Activity: Sufficiently Active (09/08/2022)   Exercise Vital Sign    Days of Exercise per Week: 5 days    Minutes of Exercise per Session: 50 min  Stress: No Stress Concern Present (05/14/2023)   [employer] of Occupational Health - Occupational Stress Questionnaire    Feeling of Stress : Only a little  Social Connections: Socially Integrated (09/08/2022)   Social Connection and Isolation Panel [NHANES]     Frequency of Communication with Friends and Family: More than three times a week    Frequency of Social Gatherings with Friends and Family: More than three times a week    Attends Religious Services: More than 4 times per year    Active Member of [employer] or Organizations: Yes    Attends Engineer, structural: More than 4 times per year    Marital Status: Married  Catering manager Violence: Not At Risk (09/08/2022)   Humiliation, Afraid, Rape, and Kick questionnaire    Fear of Current or Ex-Partner: No    Emotionally Abused: No    Physically Abused: No    Sexually Abused: No    FAMILY HISTORY: Family History  Problem Relation Age of Onset   Breast cancer Maternal Aunt     ALLERGIES:  has no known allergies.  MEDICATIONS:  Current Outpatient Medications  Medication Sig Dispense Refill   acetaminophen (TYLENOL) 500 MG tablet Take 1 tablet (500 mg total) by mouth every 6 (six) hours as needed (pain). 30 tablet 0   BIOTIN PO Take 1,000 mcg by mouth.     clobetasol ointment (TEMOVATE) 0.05 % Apply 1 Application topically 2 (two) times daily. (Patient taking differently: Apply 1 Application topically as needed.) 100 g 2   Cyanocobalamin (VITAMIN B 12 PO) Take 2,500 mcg by mouth.     estradiol (ESTRACE) 0.1 MG/GM vaginal cream Place 0.5 g vaginally 2 (two) times a week. Place 0.5g nightly for two weeks then twice a week after (Patient taking differently: Place 0.5 g vaginally 2 (two) times a week. Place 0.5g nightly for two weeks then 1 x week) 42.5 g 11   ibuprofen (ADVIL) 600 MG tablet Take 1 tablet (600 mg total) by mouth every 6 (six) hours as needed. 30 tablet 0   JARDIANCE 10 MG TABS tablet TAKE 1 TABLET BY MOUTH DAILY  BEFORE BREAKFAST 100 tablet 2   losartan (COZAAR) 50 MG tablet TAKE 1 TABLET(50 MG) BY MOUTH DAILY 90 tablet 1   MAGNESIUM PO Take 500 mg by mouth daily.      nystatin-triamcinolone ointment (MYCOLOG) Apply 1 application topically 2 (two) times daily. (Patient  taking differently: Apply 1 application  topically as needed.) 100 g 2   polyethylene glycol powder (GLYCOLAX/MIRALAX) 17 GM/SCOOP powder Take 17 g by mouth daily. Drink 17g (1 scoop) dissolved in water per day. 255 g 0   spironolactone (ALDACTONE) 25 MG tablet TAKE 1 TABLET(25 MG) BY MOUTH DAILY 90 tablet 0   No current facility-administered medications for this visit.     PHYSICAL EXAMINATION: ECOG PERFORMANCE STATUS: 0 - Asymptomatic  Vitals:   01/02/24 0827  BP: (!) 97/55  Pulse: 63  Resp: 17  Temp: (!) 97.2 F (36.2 C)  SpO2: 100%   Filed Weights   01/02/24 0827  Weight: 178 lb 12.8 oz (81.1 kg)    GENERAL:alert, no distress and comfortable SKIN: skin color, texture, turgor are normal, no rashes or significant lesions EYES: normal, conjunctiva are pink and non-injected, sclera clear OROPHARYNX:no exudate, no erythema and lips, buccal mucosa, and tongue normal  NECK: supple, thyroid normal size, non-tender, without nodularity LYMPH:  no palpable lymphadenopathy in the cervical, axillary or inguinal LUNGS: clear to auscultation and percussion with normal breathing effort HEART: regular rate & rhythm and no murmurs and no lower extremity edema ABDOMEN:abdomen soft, non-tender and normal bowel sounds Musculoskeletal:no cyanosis of digits and no clubbing  PSYCH: alert & oriented x 3 with fluent speech NEURO: no focal motor/sensory deficits  LABORATORY DATA:  I have reviewed the data as listed Lab Results  Component Value Date   WBC 5.2 12/06/2023   HGB 14.7 12/06/2023   HCT 46.0 12/06/2023   MCV 89.3 12/06/2023   PLT 364.0 12/06/2023     Chemistry      Component Value Date/Time   NA 137 12/06/2023 0930   K 4.8 12/06/2023 0930   CL 102 12/06/2023 0930   CO2 28 12/06/2023 0930   BUN 20 12/06/2023 0930   CREATININE 1.35 (H) 12/06/2023 0930      Component Value Date/Time   CALCIUM 10.6 (H) 12/06/2023 0930   ALKPHOS 69 12/06/2023 0930   AST 22 12/06/2023 0930    ALT 20 12/06/2023 0930   BILITOT 0.7 12/06/2023 0930       RADIOGRAPHIC STUDIES: I have personally reviewed the radiological images as listed and agreed with the findings in the report. [place] RT BREAST BX W LOC DEV 1ST LESION IMG BX SPEC US GUIDE Addendum Date: 12/27/2023 ADDENDUM REPORT: 12/27/2023 12:55 ADDENDUM: Pathology revealed DUCTAL CARCINOMA IN SITU, SOLID AND PAPILLARY TYPES, SUGGESTIVE OF SOLID PAPILLARY CARCINOMA, NECROSIS: PRESENT, CALCIFICATIONS: PRESENT of the RIGHT breast, 3:30 o'clock, 4 cmfn, (ribbon clip). This was found to be concordant by Dr. Frederico Hamman. Pathology results were discussed with the patient by telephone. The patient reported doing well after the biopsy with minimal tenderness at the site. Post biopsy instructions and care were reviewed and questions were answered. The patient was encouraged to call [facility] for any additional concerns. My direct phone number was provided. The patient was referred to [facility] on January 02, 2024. Pathology results reported by Rene Kocher, RN on 12/27/2023. Electronically Signed   By: Frederico Hamman M.D.   On: 12/27/2023 12:55   Result Date: 12/27/2023 CLINICAL DATA:  68 year old female presenting for ultrasound-guided biopsy of a right breast mass. EXAM: ULTRASOUND GUIDED RIGHT BREAST CORE NEEDLE BIOPSY COMPARISON:  Previous exam(s). PROCEDURE: I met with the patient and we discussed the procedure of ultrasound-guided biopsy, including benefits and alternatives. We discussed the high likelihood of a successful procedure. We discussed the risks of the procedure, including infection, bleeding, tissue injury, clip migration, and inadequate sampling. Informed written consent was given. The usual time-out protocol was performed immediately prior to the procedure. Lesion quadrant: Lower inner quadrant Using sterile technique and 1%  Lidocaine as local anesthetic, under direct ultrasound visualization, a 14 gauge spring-loaded device was used to perform biopsy of a mass in the right breast at 3:30, 4 cm from the nipple using  an inferior approach. At the conclusion of the procedure a ribbon shaped tissue marker clip was deployed into the biopsy cavity. Follow up 2 view mammogram was performed and dictated separately. IMPRESSION: Ultrasound guided biopsy of a right breast mass at 3:30 (ribbon clip). No apparent complications. Electronically Signed: By: Frederico Hamman M.D. On: 12/26/2023 13:20   MM CLIP PLACEMENT RIGHT Result Date: 12/26/2023 CLINICAL DATA:  68 year old female presenting for ultrasound-guided biopsy of a right breast mass. EXAM: 3D DIAGNOSTIC RIGHT MAMMOGRAM POST ULTRASOUND BIOPSY COMPARISON:  Previous exam(s). ACR Breast Density Category b: There are scattered areas of fibroglandular density. FINDINGS: 3D Mammographic images were obtained following ultrasound guided biopsy of a mass in the slightly lower inner right breast. The biopsy marking clip is in expected position at the site of biopsy, and within the mass seen on her prior mammogram. IMPRESSION: Appropriate positioning of the ribbon shaped biopsy marking clip at the site of biopsy in the slightly lower inner right breast. Final Assessment: Post Procedure Mammograms for Marker Placement Electronically Signed   By: Frederico Hamman M.D.   On: 12/26/2023 13:36   MM 3D DIAGNOSTIC MAMMOGRAM UNILATERAL RIGHT BREAST Result Date: 12/21/2023 CLINICAL DATA:  Recall from screening for a possible right breast mass. Patient has a history of a right lumpectomy for breast carcinoma in 2011. EXAM: DIGITAL DIAGNOSTIC UNILATERAL RIGHT MAMMOGRAM WITH TOMOSYNTHESIS AND CAD; ULTRASOUND RIGHT BREAST LIMITED TECHNIQUE: Right digital diagnostic mammography and breast tomosynthesis was performed. The images were evaluated with computer-aided detection. ; Targeted ultrasound examination  of the right breast was performed COMPARISON:  Previous exam(s). ACR Breast Density Category b: There are scattered areas of fibroglandular density. FINDINGS: On spot compression imaging, the possible mass noted in the medial right breast persists as and irregular, 7 mm mass, with associated architectural distortion. On physical exam, there is a small firm mass in the medial right breast between 3 and 4 o'clock. Targeted ultrasound is performed, showing a small hypoechoic irregular mass at 3:30 o'clock, 4 cm the nipple, measuring 8 x 4 x 4 mm. There is no internal blood flow on color Doppler analysis. This corresponds in size, shape and location to the mammographic mass. Sonographic imaging of the right axilla demonstrates normal lymph nodes. No enlarged or abnormal lymph nodes. IMPRESSION: 1. Suspicious 8 mm mass in the right breast at 3:30 o'clock. Tissue sampling is recommended. RECOMMENDATION: 1. Ultrasound-guided core needle biopsy of the 8 mm mass in the right breast at 3:30 o'clock. I have discussed the findings and recommendations with the patient. If applicable, a reminder letter will be sent to the patient regarding the next appointment. BI-RADS CATEGORY  4: Suspicious. Electronically Signed   By: Amie Portland M.D.   On: 12/21/2023 10:22   [place] LIMITED ULTRASOUND INCLUDING AXILLA RIGHT BREAST Result Date: 12/21/2023 CLINICAL DATA:  Recall from screening for a possible right breast mass. Patient has a history of a right lumpectomy for breast carcinoma in 2011. EXAM: DIGITAL DIAGNOSTIC UNILATERAL RIGHT MAMMOGRAM WITH TOMOSYNTHESIS AND CAD; ULTRASOUND RIGHT BREAST LIMITED TECHNIQUE: Right digital diagnostic mammography and breast tomosynthesis was performed. The images were evaluated with computer-aided detection. ; Targeted ultrasound examination of the right breast was performed COMPARISON:  Previous exam(s). ACR Breast Density Category b: There are scattered areas of fibroglandular density. FINDINGS: On  spot compression imaging, the possible mass noted in the medial right breast persists as and irregular, 7 mm mass, with associated architectural distortion. On physical exam, there is a small firm mass in the  medial right breast between 3 and 4 o'clock. Targeted ultrasound is performed, showing a small hypoechoic irregular mass at 3:30 o'clock, 4 cm the nipple, measuring 8 x 4 x 4 mm. There is no internal blood flow on color Doppler analysis. This corresponds in size, shape and location to the mammographic mass. Sonographic imaging of the right axilla demonstrates normal lymph nodes. No enlarged or abnormal lymph nodes. IMPRESSION: 1. Suspicious 8 mm mass in the right breast at 3:30 o'clock. Tissue sampling is recommended. RECOMMENDATION: 1. Ultrasound-guided core needle biopsy of the 8 mm mass in the right breast at 3:30 o'clock. I have discussed the findings and recommendations with the patient. If applicable, a reminder letter will be sent to the patient regarding the next appointment. BI-RADS CATEGORY  4: Suspicious. Electronically Signed   By: Amie Portland M.D.   On: 12/21/2023 10:22   MM 3D SCREENING MAMMOGRAM BILATERAL BREAST Result Date: 12/11/2023 CLINICAL DATA:  Screening. EXAM: DIGITAL SCREENING BILATERAL MAMMOGRAM WITH TOMOSYNTHESIS AND CAD TECHNIQUE: Bilateral screening digital craniocaudal and mediolateral oblique mammograms were obtained. Bilateral screening digital breast tomosynthesis was performed. The images were evaluated with computer-aided detection. COMPARISON:  Previous exam(s). ACR Breast Density Category b: There are scattered areas of fibroglandular density. FINDINGS: In the right breast, a possible mass warrants further evaluation. In the left breast, no findings suspicious for malignancy. IMPRESSION: Further evaluation is suggested for a possible mass in the right breast. RECOMMENDATION: Diagnostic mammogram and possibly ultrasound of the right breast. (Code:[id number]-[id number]) The patient  will be contacted regarding the findings, and additional imaging will be scheduled. BI-RADS CATEGORY  0: Incomplete: Need additional imaging evaluation. Electronically Signed   By: Amie Portland M.D.   On: 12/11/2023 11:47    All questions were answered. The patient knows to call the clinic with any problems, questions or concerns. I spent 45 minutes in the care of this patient including H and P, review of records, counseling and coordination of care.     Rachel Moulds, MD 01/02/2024 10:52 AM

## 2024-01-01 NOTE — Assessment & Plan Note (Addendum)
 This is a very pleasant 68 yr old post menopausal female patient with right breast ER PR positive DCIS referred to breast [facility] fofr additional recommendations.  Pathology review: I discussed with the patient the difference between DCIS and invasive breast cancer. It is considered a precancerous lesion. DCIS is classified as a Stage 0 breast cancer. It is generally detected through mammograms as calcifications. We discussed the significance of grades and its impact on prognosis. We also discussed the importance of ER and PR receptors and their implications to adjuvant treatment options. Prognosis of DCIS dependence on grade and degree of comedo necrosis. It is anticipated that if not treated, 20-30% of DCIS can develop into invasive breast cancer.  Recommendation: 1. Breast conserving surgery 2. Followed by adjuvant radiation therapy 3. Followed by antiestrogen therapy with tamoxifen/aromatase inhibitors based on menopausal status 5 years  Tamoxifen counseling: We discussed the risks and benefits of tamoxifen. These include but not limited to insomnia, hot flashes, mood changes, vaginal dryness, and weight gain. Although rare, serious side effects including endometrial cancer, risk of blood clots were also discussed. We strongly believe that the benefits far outweigh the risks. Patient understands these risks and consented to starting treatment. Planned treatment duration is 5 years.  Aromatase inhibitors counseling: We have discussed the mechanism of action of aromatase inhibitors today.  We have discussed adverse effects including but not limited to menopausal symptoms, increased risk of osteoporosis and fractures, cardiovascular events, arthralgias and myalgias.  We do believe that the benefits far outweigh the risks.  Plan treatment duration of 5 years.  She will return to clinic after surgery to review additional recommendations.  She is agreeable to proceeding with genetic testing given this is the  second episode of breast cancer.

## 2024-01-02 ENCOUNTER — Encounter: Payer: Self-pay | Admitting: *Deleted

## 2024-01-02 ENCOUNTER — Other Ambulatory Visit: Payer: Self-pay | Admitting: General Surgery

## 2024-01-02 ENCOUNTER — Ambulatory Visit
Admission: RE | Admit: 2024-01-02 | Discharge: 2024-01-02 | Disposition: A | Discharge status: Home / Self Care | Source: Ambulatory Visit | Attending: Radiation Oncology | Admitting: Radiation Oncology

## 2024-01-02 ENCOUNTER — Ambulatory Visit: Admitting: Physical Therapy

## 2024-01-02 ENCOUNTER — Inpatient Hospital Stay: Admitting: Licensed Clinical Social Worker

## 2024-01-02 ENCOUNTER — Encounter: Payer: Self-pay | Admitting: Genetic Counselor

## 2024-01-02 ENCOUNTER — Inpatient Hospital Stay: Admitting: Genetic Counselor

## 2024-01-02 ENCOUNTER — Inpatient Hospital Stay: Attending: Hematology and Oncology | Admitting: Hematology and Oncology

## 2024-01-02 ENCOUNTER — Inpatient Hospital Stay

## 2024-01-02 ENCOUNTER — Encounter: Payer: Self-pay | Admitting: Hematology and Oncology

## 2024-01-02 VITALS — BP 97/55 | HR 63 | Temp 97.2°F | Resp 17 | Wt 178.8 lb

## 2024-01-02 DIAGNOSIS — Z923 Personal history of irradiation: Secondary | ICD-10-CM | POA: Diagnosis not present

## 2024-01-02 DIAGNOSIS — F17210 Nicotine dependence, cigarettes, uncomplicated: Secondary | ICD-10-CM | POA: Diagnosis not present

## 2024-01-02 DIAGNOSIS — Z853 Personal history of malignant neoplasm of breast: Secondary | ICD-10-CM | POA: Diagnosis not present

## 2024-01-02 DIAGNOSIS — Z803 Family history of malignant neoplasm of breast: Secondary | ICD-10-CM | POA: Diagnosis not present

## 2024-01-02 DIAGNOSIS — C50311 Malignant neoplasm of lower-inner quadrant of right female breast: Secondary | ICD-10-CM | POA: Diagnosis not present

## 2024-01-02 DIAGNOSIS — Z8616 Personal history of COVID-19: Secondary | ICD-10-CM | POA: Diagnosis not present

## 2024-01-02 DIAGNOSIS — Z170 Estrogen receptor positive status [ER+]: Secondary | ICD-10-CM | POA: Diagnosis not present

## 2024-01-02 DIAGNOSIS — I129 Hypertensive chronic kidney disease with stage 1 through stage 4 chronic kidney disease, or unspecified chronic kidney disease: Secondary | ICD-10-CM | POA: Diagnosis not present

## 2024-01-02 DIAGNOSIS — M1990 Unspecified osteoarthritis, unspecified site: Secondary | ICD-10-CM | POA: Diagnosis not present

## 2024-01-02 DIAGNOSIS — Z79899 Other long term (current) drug therapy: Secondary | ICD-10-CM | POA: Diagnosis not present

## 2024-01-02 DIAGNOSIS — Z9221 Personal history of antineoplastic chemotherapy: Secondary | ICD-10-CM | POA: Diagnosis not present

## 2024-01-02 DIAGNOSIS — E1122 Type 2 diabetes mellitus with diabetic chronic kidney disease: Secondary | ICD-10-CM | POA: Diagnosis not present

## 2024-01-02 DIAGNOSIS — Z79810 Long term (current) use of selective estrogen receptor modulators (SERMs): Secondary | ICD-10-CM | POA: Diagnosis not present

## 2024-01-02 DIAGNOSIS — D0511 Intraductal carcinoma in situ of right breast: Secondary | ICD-10-CM

## 2024-01-02 LAB — CMP (CANCER CENTER ONLY)
ALT: 17 U/L (ref 0–44)
AST: 21 U/L (ref 15–41)
Albumin: 4.2 g/dL (ref 3.5–5.0)
Alkaline Phosphatase: 71 U/L (ref 38–126)
Anion gap: 5 (ref 5–15)
BUN: 22 mg/dL (ref 8–23)
CO2: 28 mmol/L (ref 22–32)
Calcium: 9.9 mg/dL (ref 8.9–10.3)
Chloride: 102 mmol/L (ref 98–111)
Creatinine: 1.69 mg/dL — ABNORMAL HIGH (ref 0.44–1.00)
GFR, Estimated: 33 mL/min — ABNORMAL LOW (ref >60)
Glucose, Bld: 95 mg/dL (ref 70–99)
Potassium: 5.3 mmol/L — ABNORMAL HIGH (ref 3.5–5.1)
Sodium: 135 mmol/L (ref 135–145)
Total Bilirubin: 0.4 mg/dL (ref 0.0–1.2)
Total Protein: 7.6 g/dL (ref 6.5–8.1)

## 2024-01-02 LAB — CBC WITH DIFFERENTIAL (CANCER CENTER ONLY)
Abs Immature Granulocytes: 0.06 10*3/uL (ref 0.00–0.07)
Basophils Absolute: 0.1 10*3/uL (ref 0.0–0.1)
Basophils Relative: 1 %
Eosinophils Absolute: 0.1 10*3/uL (ref 0.0–0.5)
Eosinophils Relative: 2 %
HCT: 42.4 % (ref 36.0–46.0)
Hemoglobin: 13.3 g/dL (ref 12.0–15.0)
Immature Granulocytes: 1 %
Lymphocytes Relative: 25 %
Lymphs Abs: 1.6 10*3/uL (ref 0.7–4.0)
MCH: 27.7 pg (ref 26.0–34.0)
MCHC: 31.4 g/dL (ref 30.0–36.0)
MCV: 88.3 fL (ref 80.0–100.0)
Monocytes Absolute: 0.5 10*3/uL (ref 0.1–1.0)
Monocytes Relative: 8 %
Neutro Abs: 4 10*3/uL (ref 1.7–7.7)
Neutrophils Relative %: 63 %
Platelet Count: 374 10*3/uL (ref 150–400)
RBC: 4.8 MIL/uL (ref 3.87–5.11)
RDW: 13.7 % (ref 11.5–15.5)
WBC Count: 6.4 10*3/uL (ref 4.0–10.5)
nRBC: 0 % (ref 0.0–0.2)

## 2024-01-02 LAB — GENETIC SCREENING ORDER

## 2024-01-02 NOTE — Progress Notes (Signed)
 [facility] Clinical Social Work  Initial Assessment   [name] is a 68 y.o. year old female accompanied by daughter, Leotis Shames. Clinical Social Work was referred by  [facility]  for assessment of psychosocial needs.   SDOH (Social Determinants of Health) assessments performed: Yes SDOH Interventions    Flowsheet Row Clinical Support from 01/02/2024 in [facility] Of [place] Care Coordination from 05/14/2023 in [facility] Community Care Coordination Clinical Support from 09/08/2022 in [facility] [place]  SDOH Interventions     Food Insecurity Interventions Intervention Not Indicated Intervention Not Indicated Intervention Not Indicated  Housing Interventions Intervention Not Indicated Intervention Not Indicated Intervention Not Indicated  Transportation Interventions Intervention Not Indicated Intervention Not Indicated Intervention Not Indicated  Utilities Interventions Intervention Not Indicated Intervention Not Indicated Intervention Not Indicated  Alcohol Usage Interventions -- -- Intervention Not Indicated (Score <7)  Financial Strain Interventions -- Intervention Not Indicated Intervention Not Indicated  Physical Activity Interventions -- -- Intervention Not Indicated  Stress Interventions -- Intervention Not Indicated Intervention Not Indicated  Social Connections Interventions -- -- Intervention Not Indicated       SDOH Screenings   Food Insecurity: No Food Insecurity (01/02/2024)  Housing: Low Risk  (01/02/2024)  Transportation Needs: No Transportation Needs (01/02/2024)  Utilities: Not At Risk (01/02/2024)  Alcohol Screen: Low Risk  (09/08/2022)  Depression (PHQ2-9): Low Risk  (12/06/2023)  Financial Resource Strain: Low Risk  (05/14/2023)  Physical Activity: Sufficiently Active (09/08/2022)  Social Connections: Socially Integrated (09/08/2022)  Stress: No Stress Concern Present (05/14/2023)  Tobacco Use: High  Risk (01/02/2024)     Distress Screen completed: No     No data to display            Family/Social Information:  Housing Arrangement: patient lives with husband Family members/support persons in your life? 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  Income source: Employment and Actor concerns: No Type of concern: None Food access concerns: no Religious or spiritual practice: Yes-part of a church community Advanced directives: Scientist, research (physical sciences) Currently in place:  [facility] Medicare  Coping/ Adjustment to diagnosis: Patient understands treatment plan and what happens next? yes, has been through surgery and radiation in 2011, now preparing for surgery and AI. Patient reported stressors: Adjusting to my illness Patient enjoys being outside, reading, time with family/ friends, and eating Current coping skills/ strengths: Ability for insight , Communication skills , Religious Affiliation , and Supportive family/friends     SUMMARY: Current SDOH Barriers:  No major barriers identified today  Clinical Social Work Clinical Goal(s):  No clinical social work goals at this time  Interventions: Discussed common feeling and emotions when being diagnosed with cancer, and the importance of support during treatment Informed patient of the support team roles and support services at [facility] Provided CSW contact information and encouraged patient to call with any questions or concerns   Follow Up Plan: Patient will contact CSW with any support or resource needs Patient verbalizes understanding of plan: Yes    Curry Seefeldt E Puneet Masoner, LCSW Clinical Social Worker [facility]

## 2024-01-02 NOTE — Progress Notes (Signed)
 Radiation Oncology         [phone]) [phone] ________________________________  Name: [name]        MRN: [id number]  Date of Service: 01/02/2024 DOB: Jul 23, 1956  JY:[id number], Austin Miles, MD  Almond Lint, MD     REFERRING PHYSICIAN: Almond Lint, MD   DIAGNOSIS: The encounter diagnosis was Ductal carcinoma in situ (DCIS) of right breast.   Stage 0 (cTis, N0, M0) ductal carcinoma in situ of the right breast, ER/PR+  History of right sided breast cancer diagnosed in 2011; s/p right lumpectomy, adjuvant chemotherapy, and radiation  HISTORY OF PRESENT ILLNESS: [name] is a 68 y.o. female with a history of right sided breast cancer; s/p lumpectomy and radiation seen in the multidisciplinary breast clinic for a new diagnosis of right breast cancer. The patient was noted to have an abnormality on screening mammogram. She proceeded with diagnostic mammogram and ultrasound on 12/21/2023 that showed a 0.8 cm mass in the 3:30 position. No abnormal lymph nodes were seen. Accordingly, patient underwent a biopsy on 12/26/2023 that revealed intermediate grade ductal carcinoma in situ that was ER and PR positive.   She is seen today to discuss treatment recommendations of her cancer.      PREVIOUS RADIATION THERAPY: Yes   Patient underwent radiation to her left breast in 2011. Records are unavailable at this time, but patient reports to have received 37 fractions.    PAST MEDICAL HISTORY:  Past Medical History:  Diagnosis Date   Breast cancer [facility]) 2011   right breast with axillary lymph node removal   Chronic kidney disease stage 1    Diabetes mellitus, type II (HCC)    type 2   Family history of breast cancer    GERD (gastroesophageal reflux disease)    History of COVID-19 05/01/2023   Hypertension    Osteoarthritis of both knees    Personal history of chemotherapy    Personal history of radiation therapy    Pneumonia    in early 1980's   Wears glasses        PAST  SURGICAL HISTORY: Past Surgical History:  Procedure Laterality Date   ABDOMINAL HYSTERECTOMY  1979   APPENDECTOMY  1978   BLADDER SUSPENSION     BREAST BIOPSY Right 12/26/2023   [place] RT BREAST BX W LOC DEV 1ST LESION IMG BX SPEC US GUIDE 12/26/2023 [facility] MAMMOGRAPHY   BREAST LUMPECTOMY Right 2011   CYSTOSCOPY N/A 06/20/2023   Procedure: CYSTOSCOPY;  Surgeon: Marguerita Beards, MD;  Location: [facility];  Service: Gynecology;  Laterality: N/A;   JOINT REPLACEMENT     bil knees   KNEE ARTHROPLASTY Left 06/29/2016   Procedure: LEFT TOTAL KNEE WITH NAVIGATION;  Surgeon: Samson Frederic, MD;  Location[facility];  Service: Orthopedics;  Laterality: Left;   KNEE ARTHROPLASTY Right 12/14/2016   Procedure: RIGHT TOTAL KNEE ARTHROPLASTY WITH COMPUTER NAVIGATION;  Surgeon: Samson Frederic, MD;  Location: [facility];  Service: Orthopedics;  Laterality: Right;  Needs RNFA   LUMBAR LAMINECTOMY/DECOMPRESSION MICRODISCECTOMY N/A 08/23/2018   Procedure: L3-4 DECOMPRESSION, REMOVAL OF INTRASPINAL EXTRADURAL FACET CYST;  Surgeon: Eldred Manges, MD;  Location: [facility];  Service: Orthopedics;  Laterality: N/A;   MENISCUS REPAIR     right knee   OOPHORECTOMY     ROBOTIC ASSISTED LAPAROSCOPIC SACROCOLPOPEXY N/A 06/20/2023   Procedure: XI ROBOTIC ASSISTED LAPAROSCOPIC SACROCOLPOPEXY;  Surgeon: Marguerita Beards, MD;  Location: [facility];  Service: Gynecology;  Laterality: N/A;  FAMILY HISTORY:  Family History  Problem Relation Age of Onset   Breast cancer Maternal Aunt    Breast cancer Half-Sister 22   Liver disease Half-Sister      SOCIAL HISTORY:  reports that she has been smoking cigarettes. She started smoking about 30 years ago. She has a 10.1 pack-year smoking history. She has never used smokeless tobacco. She reports that she does not currently use alcohol. She reports current drug use.   ALLERGIES: Patient has no known allergies.   MEDICATIONS:  Current  Outpatient Medications  Medication Sig Dispense Refill   acetaminophen (TYLENOL) 500 MG tablet Take 1 tablet (500 mg total) by mouth every 6 (six) hours as needed (pain). 30 tablet 0   BIOTIN PO Take 1,000 mcg by mouth.     clobetasol ointment (TEMOVATE) 0.05 % Apply 1 Application topically 2 (two) times daily. (Patient taking differently: Apply 1 Application topically as needed.) 100 g 2   Cyanocobalamin (VITAMIN B 12 PO) Take 2,500 mcg by mouth.     estradiol (ESTRACE) 0.1 MG/GM vaginal cream Place 0.5 g vaginally 2 (two) times a week. Place 0.5g nightly for two weeks then twice a week after (Patient taking differently: Place 0.5 g vaginally 2 (two) times a week. Place 0.5g nightly for two weeks then 1 x week) 42.5 g 11   ibuprofen (ADVIL) 600 MG tablet Take 1 tablet (600 mg total) by mouth every 6 (six) hours as needed. 30 tablet 0   JARDIANCE 10 MG TABS tablet TAKE 1 TABLET BY MOUTH DAILY  BEFORE BREAKFAST 100 tablet 2   losartan (COZAAR) 50 MG tablet TAKE 1 TABLET(50 MG) BY MOUTH DAILY 90 tablet 1   MAGNESIUM PO Take 500 mg by mouth daily.      nystatin-triamcinolone ointment (MYCOLOG) Apply 1 application topically 2 (two) times daily. (Patient taking differently: Apply 1 application  topically as needed.) 100 g 2   polyethylene glycol powder (GLYCOLAX/MIRALAX) 17 GM/SCOOP powder Take 17 g by mouth daily. Drink 17g (1 scoop) dissolved in water per day. 255 g 0   spironolactone (ALDACTONE) 25 MG tablet TAKE 1 TABLET(25 MG) BY MOUTH DAILY 90 tablet 0   No current facility-administered medications for this encounter.     REVIEW OF SYSTEMS: On review of systems, the patient reports that she is doing well overall. No breast specific complaints are verbalized.        PHYSICAL EXAM:  Wt Readings from Last 3 Encounters:  01/02/24 178 lb 12.8 oz (81.1 kg)  12/06/23 173 lb 9.6 oz (78.7 kg)  06/20/23 173 lb 9.6 oz (78.7 kg)   Temp Readings from Last 3 Encounters:  01/02/24 (!) 97.2 F (36.2  C) (Temporal)  12/06/23 98.4 F (36.9 C)  06/20/23 (!) 97.4 F (36.3 C)   BP Readings from Last 3 Encounters:  01/02/24 (!) 97/55  12/06/23 110/76  08/01/23 98/65   Pulse Readings from Last 3 Encounters:  01/02/24 63  12/06/23 60  08/01/23 75    In general this is a well appearing female in no acute distress. She's alert and oriented x4 and appropriate throughout the examination. Cardiopulmonary assessment is negative for acute distress and she exhibits normal effort. Bilateral breast exam is deferred.    ECOG = 0  0 - Asymptomatic (Fully active, able to carry on all predisease activities without restriction)  1 - Symptomatic but completely ambulatory (Restricted in physically strenuous activity but ambulatory and able to carry out work of a light or sedentary  nature. For example, light housework, office work)  2 - Symptomatic, <50% in bed during the day (Ambulatory and capable of all self care but unable to carry out any work activities. Up and about more than 50% of waking hours)  3 - Symptomatic, >50% in bed, but not bedbound (Capable of only limited self-care, confined to bed or chair 50% or more of waking hours)  4 - Bedbound (Completely disabled. Cannot carry on any self-care. Totally confined to bed or chair)  5 - Death   Santiago Glad MM, Creech RH, Tormey DC, et al. [phone]). "Toxicity and response criteria of the [facility]". Am. Evlyn Clines. Oncol. 5 (6): 649-55    LABORATORY DATA:  Lab Results  Component Value Date   WBC 6.4 01/02/2024   HGB 13.3 01/02/2024   HCT 42.4 01/02/2024   MCV 88.3 01/02/2024   PLT 374 01/02/2024   Lab Results  Component Value Date   NA 135 01/02/2024   K 5.3 (H) 01/02/2024   CL 102 01/02/2024   CO2 28 01/02/2024   Lab Results  Component Value Date   ALT 17 01/02/2024   AST 21 01/02/2024   ALKPHOS 71 01/02/2024   BILITOT 0.4 01/02/2024      RADIOGRAPHY: [place] RT BREAST BX W LOC DEV 1ST LESION IMG BX SPEC US  GUIDE Addendum Date: 12/27/2023 ADDENDUM REPORT: 12/27/2023 12:55 ADDENDUM: Pathology revealed DUCTAL CARCINOMA IN SITU, SOLID AND PAPILLARY TYPES, SUGGESTIVE OF SOLID PAPILLARY CARCINOMA, NECROSIS: PRESENT, CALCIFICATIONS: PRESENT of the RIGHT breast, 3:30 o'clock, 4 cmfn, (ribbon clip). This was found to be concordant by Dr. Frederico Hamman. Pathology results were discussed with the patient by telephone. The patient reported doing well after the biopsy with minimal tenderness at the site. Post biopsy instructions and care were reviewed and questions were answered. The patient was encouraged to call [facility] for any additional concerns. My direct phone number was provided. The patient was referred to [facility] on January 02, 2024. Pathology results reported by Rene Kocher, RN on 12/27/2023. Electronically Signed   By: Frederico Hamman M.D.   On: 12/27/2023 12:55   Result Date: 12/27/2023 CLINICAL DATA:  68 year old female presenting for ultrasound-guided biopsy of a right breast mass. EXAM: ULTRASOUND GUIDED RIGHT BREAST CORE NEEDLE BIOPSY COMPARISON:  Previous exam(s). PROCEDURE: I met with the patient and we discussed the procedure of ultrasound-guided biopsy, including benefits and alternatives. We discussed the high likelihood of a successful procedure. We discussed the risks of the procedure, including infection, bleeding, tissue injury, clip migration, and inadequate sampling. Informed written consent was given. The usual time-out protocol was performed immediately prior to the procedure. Lesion quadrant: Lower inner quadrant Using sterile technique and 1% Lidocaine as local anesthetic, under direct ultrasound visualization, a 14 gauge spring-loaded device was used to perform biopsy of a mass in the right breast at 3:30, 4 cm from the nipple using an inferior approach. At the conclusion of the  procedure a ribbon shaped tissue marker clip was deployed into the biopsy cavity. Follow up 2 view mammogram was performed and dictated separately. IMPRESSION: Ultrasound guided biopsy of a right breast mass at 3:30 (ribbon clip). No apparent complications. Electronically Signed: By: Frederico Hamman M.D. On: 12/26/2023 13:20   MM CLIP PLACEMENT RIGHT Result Date: 12/26/2023 CLINICAL DATA:  68 year old female presenting for ultrasound-guided biopsy of a right breast mass. EXAM: 3D DIAGNOSTIC RIGHT MAMMOGRAM POST ULTRASOUND  BIOPSY COMPARISON:  Previous exam(s). ACR Breast Density Category b: There are scattered areas of fibroglandular density. FINDINGS: 3D Mammographic images were obtained following ultrasound guided biopsy of a mass in the slightly lower inner right breast. The biopsy marking clip is in expected position at the site of biopsy, and within the mass seen on her prior mammogram. IMPRESSION: Appropriate positioning of the ribbon shaped biopsy marking clip at the site of biopsy in the slightly lower inner right breast. Final Assessment: Post Procedure Mammograms for Marker Placement Electronically Signed   By: Frederico Hamman M.D.   On: 12/26/2023 13:36   MM 3D DIAGNOSTIC MAMMOGRAM UNILATERAL RIGHT BREAST Result Date: 12/21/2023 CLINICAL DATA:  Recall from screening for a possible right breast mass. Patient has a history of a right lumpectomy for breast carcinoma in 2011. EXAM: DIGITAL DIAGNOSTIC UNILATERAL RIGHT MAMMOGRAM WITH TOMOSYNTHESIS AND CAD; ULTRASOUND RIGHT BREAST LIMITED TECHNIQUE: Right digital diagnostic mammography and breast tomosynthesis was performed. The images were evaluated with computer-aided detection. ; Targeted ultrasound examination of the right breast was performed COMPARISON:  Previous exam(s). ACR Breast Density Category b: There are scattered areas of fibroglandular density. FINDINGS: On spot compression imaging, the possible mass noted in the medial right breast  persists as and irregular, 7 mm mass, with associated architectural distortion. On physical exam, there is a small firm mass in the medial right breast between 3 and 4 o'clock. Targeted ultrasound is performed, showing a small hypoechoic irregular mass at 3:30 o'clock, 4 cm the nipple, measuring 8 x 4 x 4 mm. There is no internal blood flow on color Doppler analysis. This corresponds in size, shape and location to the mammographic mass. Sonographic imaging of the right axilla demonstrates normal lymph nodes. No enlarged or abnormal lymph nodes. IMPRESSION: 1. Suspicious 8 mm mass in the right breast at 3:30 o'clock. Tissue sampling is recommended. RECOMMENDATION: 1. Ultrasound-guided core needle biopsy of the 8 mm mass in the right breast at 3:30 o'clock. I have discussed the findings and recommendations with the patient. If applicable, a reminder letter will be sent to the patient regarding the next appointment. BI-RADS CATEGORY  4: Suspicious. Electronically Signed   By: Amie Portland M.D.   On: 12/21/2023 10:22   [place] LIMITED ULTRASOUND INCLUDING AXILLA RIGHT BREAST Result Date: 12/21/2023 CLINICAL DATA:  Recall from screening for a possible right breast mass. Patient has a history of a right lumpectomy for breast carcinoma in 2011. EXAM: DIGITAL DIAGNOSTIC UNILATERAL RIGHT MAMMOGRAM WITH TOMOSYNTHESIS AND CAD; ULTRASOUND RIGHT BREAST LIMITED TECHNIQUE: Right digital diagnostic mammography and breast tomosynthesis was performed. The images were evaluated with computer-aided detection. ; Targeted ultrasound examination of the right breast was performed COMPARISON:  Previous exam(s). ACR Breast Density Category b: There are scattered areas of fibroglandular density. FINDINGS: On spot compression imaging, the possible mass noted in the medial right breast persists as and irregular, 7 mm mass, with associated architectural distortion. On physical exam, there is a small firm mass in the medial right breast between 3  and 4 o'clock. Targeted ultrasound is performed, showing a small hypoechoic irregular mass at 3:30 o'clock, 4 cm the nipple, measuring 8 x 4 x 4 mm. There is no internal blood flow on color Doppler analysis. This corresponds in size, shape and location to the mammographic mass. Sonographic imaging of the right axilla demonstrates normal lymph nodes. No enlarged or abnormal lymph nodes. IMPRESSION: 1. Suspicious 8 mm mass in the right breast at 3:30 o'clock. Tissue sampling is recommended. RECOMMENDATION:  1. Ultrasound-guided core needle biopsy of the 8 mm mass in the right breast at 3:30 o'clock. I have discussed the findings and recommendations with the patient. If applicable, a reminder letter will be sent to the patient regarding the next appointment. BI-RADS CATEGORY  4: Suspicious. Electronically Signed   By: Amie Portland M.D.   On: 12/21/2023 10:22   MM 3D SCREENING MAMMOGRAM BILATERAL BREAST Result Date: 12/11/2023 CLINICAL DATA:  Screening. EXAM: DIGITAL SCREENING BILATERAL MAMMOGRAM WITH TOMOSYNTHESIS AND CAD TECHNIQUE: Bilateral screening digital craniocaudal and mediolateral oblique mammograms were obtained. Bilateral screening digital breast tomosynthesis was performed. The images were evaluated with computer-aided detection. COMPARISON:  Previous exam(s). ACR Breast Density Category b: There are scattered areas of fibroglandular density. FINDINGS: In the right breast, a possible mass warrants further evaluation. In the left breast, no findings suspicious for malignancy. IMPRESSION: Further evaluation is suggested for a possible mass in the right breast. RECOMMENDATION: Diagnostic mammogram and possibly ultrasound of the right breast. (Code:[id number]) The patient will be contacted regarding the findings, and additional imaging will be scheduled. BI-RADS CATEGORY  0: Incomplete: Need additional imaging evaluation. Electronically Signed   By: Amie Portland M.D.   On: 12/11/2023 11:47        IMPRESSION/PLAN: 1. 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 Mitzi Hansen discussed the pathology findings and reviewed the nature of early stage breast disease. The consensus from the breast conference includes lumpectomy versus mastectomy followed by adjuvant antiestrogen therapy. Given early stage disease and previous history of radiation to her right breast, Dr. Mitzi Hansen is not recommending adjuvant radiation. We discussed the risks, benefits, short and long term effects of re-irradiation and the patient is agreeable to forego radiation treatment at this time. We will review her final pathology to confirm no role for radiation.  2. Possible genetic predisposition to malignancy. The patient is a candidate for genetic testing given her personal and family history. She will meet with our geneticist today in clinic.   In a visit lasting 60 minutes, greater than 50% of the time was spent face to face reviewing her case, as well as in preparation of, discussing, and coordinating the patient's care.  The above documentation reflects my direct findings during this shared patient visit. Please see the separate note by Dr. Mitzi Hansen on this date for the remainder of the patient's plan of care.    Joyice Faster, [place]    **Disclaimer: This note was dictated with voice recognition software. Similar sounding words can inadvertently be transcribed and this note may contain transcription errors which may not have been corrected upon publication of note.**

## 2024-01-02 NOTE — Progress Notes (Unsigned)
 REFERRING PROVIDER: Rachel Moulds, MD [address] [place],  [place] [address]  PRIMARY PROVIDER:  Myrlene Broker, MD  PRIMARY REASON FOR VISIT:  1. Ductal carcinoma in situ (DCIS) of right breast   2. Family history of breast cancer   3. Personal history of malignant neoplasm of breast      HISTORY OF PRESENT ILLNESS:   [name], a 68 y.o. female, was seen for a [place] cancer genetics consultation at the request of Dr. Al Pimple due to a {Personal/family:20331} history of {cancer/polyps}.  [name] presents to clinic today to discuss the possibility of a hereditary predisposition to cancer, genetic testing, and to further clarify her future cancer risks, as well as potential cancer risks for family members.   In ***, at the age of ***, [name] was diagnosed with {CA PATHOLOGY:63853} of the {right left (wildcard):15202} {CA UEAVW:09811}. The treatment plan ***.    *** [name] is a 68 y.o. female with no personal history of cancer.    CANCER HISTORY:  Oncology History  Ductal carcinoma in situ (DCIS) of right breast  12/06/2023 Mammogram   Possible mass in the right breast warranting further evaluation. Suspicious mass in the right breast at 3:30 o clock measuring 8 mm   12/26/2023 Pathology Results   DCIS with necrosis and calcs present, ER and PR strongly positive.   12/31/2023 Initial Diagnosis   Ductal carcinoma in situ (DCIS) of right breast   01/02/2024 Cancer Staging   Staging form: Breast, AJCC 8th Edition - Clinical stage from 01/02/2024: Stage 0 (cTis (DCIS), cN0, cM0, ER+, PR+) - Signed by Rachel Moulds, MD on 01/02/2024 Stage prefix: Initial diagnosis Nuclear grade: G2      RISK FACTORS:  Menarche was at age 27.  First live birth at age 89.     Menopausal status: postmenopausal.  Colonoscopy: yes; normal. Mammogram within the last year: yes.   Past Medical History:  Diagnosis Date   Breast cancer [facility]) 2011   right breast with axillary  lymph node removal   Chronic kidney disease stage 1    Diabetes mellitus, type II (HCC)    type 2   Family history of breast cancer    GERD (gastroesophageal reflux disease)    History of COVID-19 05/01/2023   Hypertension    Osteoarthritis of both knees    Personal history of chemotherapy    Personal history of radiation therapy    Pneumonia    in early 1980's   Wears glasses     Past Surgical History:  Procedure Laterality Date   ABDOMINAL HYSTERECTOMY  1979   APPENDECTOMY  1978   BLADDER SUSPENSION     BREAST BIOPSY Right 12/26/2023   [place] RT BREAST BX W LOC DEV 1ST LESION IMG BX SPEC US GUIDE 12/26/2023 [facility]   BREAST LUMPECTOMY Right 2011   CYSTOSCOPY N/A 06/20/2023   Procedure: CYSTOSCOPY;  Surgeon: Marguerita Beards, MD;  Location: [facility];  Service: Gynecology;  Laterality: N/A;   JOINT REPLACEMENT     bil knees   KNEE ARTHROPLASTY Left 06/29/2016   Procedure: LEFT TOTAL KNEE WITH NAVIGATION;  Surgeon: Samson Frederic, MD;  Location: [facility];  Service: Orthopedics;  Laterality: Left;   KNEE ARTHROPLASTY Right 12/14/2016   Procedure: RIGHT TOTAL KNEE ARTHROPLASTY WITH COMPUTER NAVIGATION;  Surgeon: Samson Frederic, MD;  Location: [facility];  Service: Orthopedics;  Laterality: Right;  Needs RNFA   LUMBAR LAMINECTOMY/DECOMPRESSION MICRODISCECTOMY N/A 08/23/2018  Procedure: L3-4 DECOMPRESSION, REMOVAL OF INTRASPINAL EXTRADURAL FACET CYST;  Surgeon: Eldred Manges, MD;  Location: [facility];  Service: Orthopedics;  Laterality: N/A;   MENISCUS REPAIR     right knee   OOPHORECTOMY     ROBOTIC ASSISTED LAPAROSCOPIC SACROCOLPOPEXY N/A 06/20/2023   Procedure: XI ROBOTIC ASSISTED LAPAROSCOPIC SACROCOLPOPEXY;  Surgeon: Marguerita Beards, MD;  Location: [facility];  Service: Gynecology;  Laterality: N/A;    Social History   Socioeconomic History   Marital status: Married    Spouse name: Ramon Dredge   Number of children: 3   Years of  education: Not on file   Highest education level: Not on file  Occupational History   Occupation: medical records    Employer:   Tobacco Use   Smoking status: Every Day    Current packs/day: 0.25    Average packs/day: 0.5 packs/day for 20.2 years (10.1 ttl pk-yrs)    Types: Cigarettes    Start date: 10/14/1993    Last attempt to quit: 10/14/2013   Smokeless tobacco: Never  Vaping Use   Vaping status: Never Used  Substance and Sexual Activity   Alcohol use: Not Currently   Drug use: Yes    Comment: thc gummies prn 2 x week   Sexual activity: Yes    Birth control/protection: Post-menopausal  Other Topics Concern   Not on file  Social History Narrative   Not on file   Social Drivers of Health   Financial Resource Strain: Low Risk  (05/14/2023)   Overall Financial Resource Strain (CARDIA)    Difficulty of Paying Living Expenses: Not hard at all  Food Insecurity: No Food Insecurity (05/14/2023)   Hunger Vital Sign    Worried About Running Out of Food in the Last Year: Never true    Ran Out of Food in the Last Year: Never true  Transportation Needs: No Transportation Needs (05/14/2023)   PRAPARE - Administrator, Civil Service (Medical): No    Lack of Transportation (Non-Medical): No  Physical Activity: Sufficiently Active (09/08/2022)   Exercise Vital Sign    Days of Exercise per Week: 5 days    Minutes of Exercise per Session: 50 min  Stress: No Stress Concern Present (05/14/2023)   [employer] of Occupational Health - Occupational Stress Questionnaire    Feeling of Stress : Only a little  Social Connections: Socially Integrated (09/08/2022)   Social Connection and Isolation Panel [NHANES]    Frequency of Communication with Friends and Family: More than three times a week    Frequency of Social Gatherings with Friends and Family: More than three times a week    Attends Religious Services: More than 4 times per year    Active Member of [employer] or  Organizations: Yes    Attends Engineer, structural: More than 4 times per year    Marital Status: Married     FAMILY HISTORY:  We obtained a detailed, 4-generation family history.  Significant diagnoses are listed below: Family History  Problem Relation Age of Onset   Breast cancer Maternal Aunt    Breast cancer Half-Sister 43   Liver disease Half-Sister     [name] is {aware/unaware} of previous family history of genetic testing for hereditary cancer risks. Patient's maternal ancestors are of *** descent, and paternal ancestors are of *** descent. There {IS NO:12509} reported Ashkenazi Jewish ancestry. There {IS NO:12509} known consanguinity.  GENETIC COUNSELING ASSESSMENT: [name] is a 68 y.o. female  with a {Personal/family:20331} history of {cancer/polyps} which is somewhat suggestive of a {DISEASE} and predisposition to cancer given ***. We, therefore, discussed and recommended the following at today's visit.   DISCUSSION: We discussed that, in general, most cancer is not inherited in families, but instead is sporadic or familial. Sporadic cancers occur by chance and typically happen at older ages (>50 years) as this type of cancer is caused by genetic changes acquired during an individual's lifetime. Some families have more cancers than would be expected by chance; however, the ages or types of cancer are not consistent with a known genetic mutation or known genetic mutations have been ruled out. This type of familial cancer is thought to be due to a combination of multiple genetic, environmental, hormonal, and lifestyle factors. While this combination of factors likely increases the risk of cancer, the exact source of this risk is not currently identifiable or testable.  We discussed that *** - ***% of *** is hereditary, with most cases associated with ***.  There are other genes that can be associated with hereditary *** cancer syndromes.  These include ***.  We discussed  that testing is beneficial for several reasons including knowing how to follow individuals after completing their treatment, identifying whether potential treatment options such as PARP inhibitors would be beneficial, and understand if other family members could be at risk for cancer and allow them to undergo genetic testing.   We reviewed the characteristics, features and inheritance patterns of hereditary cancer syndromes. We also discussed genetic testing, including the appropriate family members to test, the process of testing, insurance coverage and turn-around-time for results. We discussed the implications of a negative, positive, carrier and/or variant of uncertain significant result. [name]  was offered a common hereditary cancer panel (36+ genes) and an expanded pan-cancer panel (70+ genes). [name] was informed of the benefits and limitations of each panel, including that expanded pan-cancer panels contain genes that do not have clear management guidelines at this point in time.  We also discussed that as the number of genes included on a panel increases, the chances of variants of uncertain significance increases. [name] decided to pursue genetic testing for the *** gene panel.   Based on Ms. Betancur's {Personal/family:20331} history of cancer, she meets medical criteria for genetic testing. ***Though [name] is not personally affected, there are no affected family members that are willing/able/available to undergo hereditary cancer testing.  Therefore, [name] the most informative family member available.  Despite that she meets criteria, she may still have an out of pocket cost. We discussed that if her out of pocket cost for testing is over $100, the laboratory will call and confirm whether she wants to proceed with testing.  If the out of pocket cost of testing is less than $100 she will be billed by the genetic testing laboratory.   We discussed that some people do not  want to undergo genetic testing due to fear of genetic discrimination.  The Genetic Information Nondiscrimination Act (GINA) was signed into federal law in 2008. GINA prohibits health insurers and most employers from discriminating against individuals based on genetic information (including the results of genetic tests and family history information). According to GINA, health insurance companies cannot consider genetic information to be a preexisting condition, nor can they use it to make decisions regarding coverage or rates. GINA also makes it illegal for most employers to use genetic information in making decisions about hiring, firing, promotion, or terms of  employment. It is important to note that GINA does not offer protections for life insurance, disability insurance, or long-term care insurance. GINA does not apply to those in the [employer], those who work for companies with less than 15 employees, and new life insurance or long-term disability insurance policies.  Health status due to a cancer diagnosis is not protected under GINA. More information about GINA can be found by visiting [email or web address].  ***We reviewed the characteristics, features and inheritance patterns of hereditary cancer syndromes. We also discussed genetic testing, including the appropriate family members to test, the process of testing, insurance coverage and turn-around-time for results. We discussed the implications of a negative, positive and/or variant of uncertain significant result. In order to get genetic test results in a timely manner so that [name] can use these genetic test results for surgical decisions, we recommended [name] pursue genetic testing for the ***. Once complete, we recommend [name] pursue reflex genetic testing to the *** gene panel.   Based on [name]'s {Personal/family:20331} history of cancer, she meets medical criteria for genetic testing. Despite that she meets criteria, she may  still have an out of pocket cost.   ***We discussed with [name] that the {Personal/family:20331} history does not meet insurance or NCCN criteria for genetic testing and, therefore, is not highly consistent with a familial hereditary cancer syndrome.  We feel she is at low risk to harbor a gene mutation associated with such a condition. Thus, we did not recommend any genetic testing, at this time, and recommended [name] continue to follow the cancer screening guidelines given by her primary healthcare provider.  ***In order to estimate her chance of having a {CA GENE:62345} mutation, we used statistical models ({GENMODELS:62370}) that consider her personal medical history, family history and ancestry.  Because each model is different, there can be a lot of variability in the risks they give.  Therefore, these numbers must be considered a rough range and not a precise risk of having a {CA GENE:62345} mutation.  These models estimate that she has approximately a ***-***% chance of having a mutation. Based on this assessment of her family and personal history, genetic testing {IS/ISNOT:34056} recommended.  ***The Tyrer-Cuzick model is one of multiple prediction models developed to estimate an individual's lifetime risk of developing breast cancer. The Tyrer-Cuzick model is endorsed by the [employer] (NCCN). This model includes many risk factors such as family history, endogenous estrogen exposure, and benign breast disease. The calculation is highly-dependent on the accuracy of clinical data provided by the patient and can change over time. The Tyrer-Cuzick model may be repeated to reflect new information in her personal or family history in the future.   ***Based on the patient's {Personal/family:20331} history, a statistical model ({GENMODELS:62370}) was used to estimate her risk of developing {CA HX:54794}. This estimates her lifetime risk of developing {CA HX:54794} to  be approximately ***%. This estimation does not consider any genetic testing results.  The patient's lifetime breast cancer risk is a preliminary estimate based on available information using one of several models endorsed by the American Cancer Society (ACS). The ACS recommends consideration of breast MRI screening as an adjunct to mammography for patients at high risk (defined as 20% or greater lifetime risk). Please note that a woman's breast cancer risk changes over time. It may increase or decrease based on age and any changes to the personal and/or family medical history. The risks and recommendations listed above apply to this  patient at this point in time. In the future, she may or may not be eligible for the same medical management strategies and, in some cases, other medical management strategies may become available to her. If she is interested in an updated breast cancer risk assessment at a later date, she can contact us.  ***Ms. Kallenberger has been determined to be at high risk for breast cancer.  her Tyrer-Cuzick risk score is ***%.  For women with a greater than 20% lifetime risk of breast cancer, the [employer] (NCCN) recommends the following:  1.      Clinical encounter every 6-12 months to begin when identified as being at increased risk, but not before age 19  2.      Annual mammograms. Tomosynthesis is recommended starting 10 years earlier than the youngest breast cancer diagnosis in the family or at age 78 (whichever comes first), but not before age 1   38.      Annual breast MRI starting 10 years earlier than the youngest breast cancer diagnosis in the family or at age 56 (whichever comes first), but not before age 63.    PLAN: After considering the risks, benefits, and limitations, Ms. Pawling provided informed consent to pursue genetic testing and the blood sample was sent to {Lab} Laboratories for analysis of the {test}. Results should be available within  approximately {TAT TIME} weeks' time, at which point they will be disclosed by telephone to Ms. Haberer, as will any additional recommendations warranted by these results. Ms. Stegall will receive a summary of her genetic counseling visit and a copy of her results once available. This information will also be available in Epic.   *** Despite our recommendation, Ms. Mcvicar did not wish to pursue genetic testing at today's visit. We understand this decision and remain available to coordinate genetic testing at any time in the future. We, therefore, recommend Ms. Brosious continue to follow the cancer screening guidelines given by her primary healthcare provider.  ***Based on Ms. Knapper's family history, we recommended her ***, who was diagnosed with *** at age ***, have genetic counseling and testing. Ms. Hush will let us know if we can be of any assistance in coordinating genetic counseling and/or testing for this family member.   Lastly, we encouraged Ms. Rozelle to remain in contact with cancer genetics annually so that we can continuously update the family history and inform her of any changes in cancer genetics and testing that may be of benefit for this family.   Ms. Beeck questions were answered to her satisfaction today. Our contact information was provided should additional questions or concerns arise. Thank you for the referral and allowing [place] to share in the care of your patient.   Carsten Carstarphen P. Lowell Guitar, MS, CGC Licensed, Patent attorney [email or web address] phone: [phone]  *** minutes were spent on the date of the encounter in service to the patient including preparation, face-to-face consultation, documentation and care coordination.  *** The patient was seen alone.  ***The patient brought ***. Drs. Meliton Rattan, and/or [place] were available for questions, if needed..    _______________________________________________________________________ For Office Staff:   Number of people involved in session: *** Was an Intern/ student involved with case: {YES/NO:63}

## 2024-01-04 ENCOUNTER — Encounter: Payer: Self-pay | Admitting: *Deleted

## 2024-01-04 ENCOUNTER — Telehealth: Payer: Self-pay | Admitting: *Deleted

## 2024-01-04 NOTE — Telephone Encounter (Signed)
 Spoke to pt concerning [facility] from 01/02/24. Denies questions or concerns regarding dx or treatment care plan. Encourage pt to call with needs. Received verbal understanding.

## 2024-01-05 ENCOUNTER — Other Ambulatory Visit: Payer: Self-pay | Admitting: Internal Medicine

## 2024-01-05 DIAGNOSIS — E119 Type 2 diabetes mellitus without complications: Secondary | ICD-10-CM

## 2024-01-07 ENCOUNTER — Encounter

## 2024-01-07 ENCOUNTER — Other Ambulatory Visit: Payer: Self-pay | Admitting: General Surgery

## 2024-01-07 ENCOUNTER — Encounter: Payer: Self-pay | Admitting: *Deleted

## 2024-01-07 DIAGNOSIS — C50311 Malignant neoplasm of lower-inner quadrant of right female breast: Secondary | ICD-10-CM

## 2024-01-07 DIAGNOSIS — D0511 Intraductal carcinoma in situ of right breast: Secondary | ICD-10-CM

## 2024-01-11 ENCOUNTER — Telehealth: Payer: Self-pay | Admitting: Genetic Counselor

## 2024-01-11 ENCOUNTER — Encounter: Payer: Self-pay | Admitting: Genetic Counselor

## 2024-01-11 DIAGNOSIS — Z1379 Encounter for other screening for genetic and chromosomal anomalies: Secondary | ICD-10-CM | POA: Insufficient documentation

## 2024-01-11 NOTE — Telephone Encounter (Signed)
Revealed negative genetic testing.  Discussed that we do not know why she has breast cancer or why there is cancer in the family. It could be due to a different gene that we are not testing, or maybe our current technology may not be able to pick something up.  It will be important for her to keep in contact with genetics to keep up with whether additional testing may be needed. 

## 2024-01-14 ENCOUNTER — Telehealth: Payer: Self-pay | Admitting: Genetic Counselor

## 2024-01-14 NOTE — Telephone Encounter (Signed)
 LM on VM that results are back and to please call.  Left CB instructions.

## 2024-01-15 ENCOUNTER — Ambulatory Visit: Payer: Self-pay | Admitting: Genetic Counselor

## 2024-01-15 DIAGNOSIS — Z1379 Encounter for other screening for genetic and chromosomal anomalies: Secondary | ICD-10-CM

## 2024-01-15 NOTE — Progress Notes (Signed)
 HPI:  Ms. Mirkin was previously seen in the [facility] due to a personal and family history of breast cancer and concerns regarding a hereditary predisposition to cancer. Please refer to our prior cancer genetics clinic note for more information regarding our discussion, assessment and recommendations, at the time. Ms. Mckiver recent genetic test results were disclosed to her, as were recommendations warranted by these results. These results and recommendations are discussed in more detail below.  CANCER HISTORY:  Oncology History  Ductal carcinoma in situ (DCIS) of right breast  12/06/2023 Mammogram   Possible mass in the right breast warranting further evaluation. Suspicious mass in the right breast at 3:30 o clock measuring 8 mm   12/26/2023 Pathology Results   DCIS with necrosis and calcs present, ER and PR strongly positive.   12/31/2023 Initial Diagnosis   Ductal carcinoma in situ (DCIS) of right breast   01/02/2024 Cancer Staging   Staging form: Breast, AJCC 8th Edition - Clinical stage from 01/02/2024: Stage 0 (cTis (DCIS), cN0, cM0, ER+, PR+) - Signed by Murleen Arms, MD on 01/02/2024 Stage prefix: Initial diagnosis Nuclear grade: G2   01/11/2024 Genetic Testing   Negative genetic testing on the BRCAPlus panel and CancerNext-Expanded+RNAinsight panel.  The report date is January 11, 2024 and January 13, 2024.  The CancerNext-Expanded gene panel offered by [facility] and includes sequencing, rearrangement, and RNA analysis for the following 76 genes: AIP, ALK, APC, ATM, AXIN2, BAP1, BARD1, BMPR1A, BRCA1, BRCA2, BRIP1, CDC73, CDH1, CDK4, CDKN1B, CDKN2A, CEBPA, CHEK2, CTNNA1, DDX41, DICER1, ETV6, FH, FLCN, GATA2, LZTR1, MAX, MBD4, MEN1, MET, MLH1, MSH2, MSH3, MSH6, MUTYH, NF1, NF2, NTHL1, PALB2, PHOX2B, PMS2, POT1, PRKAR1A, PTCH1, PTEN, RAD51C, RAD51D, RB1, RET, RUNX1, SDHA, SDHAF2, SDHB, SDHC, SDHD, SMAD4, SMARCA4, SMARCB1, SMARCE1, STK11, SUFU, TMEM127, TP53, TSC1,  TSC2, VHL, and WT1 (sequencing and deletion/duplication); EGFR, HOXB13, KIT, MITF, PDGFRA, POLD1, and POLE (sequencing only); EPCAM and GREM1 (deletion/duplication only).  The BRCAPlus gene panel offered by [facility] and includes sequencing and rearrangement analysis for the following 13 genes: ATM, BARD1, BRCA1, BRCA2, CDH1, CHEK2, NF1, PALB2, PTEN, RAD51C, RAD51D, STK11 and TP53.      FAMILY HISTORY:  We obtained a detailed, 4-generation family history.  Significant diagnoses are listed below: Family History  Problem Relation Age of Onset   Breast cancer Maternal Aunt    Breast cancer Half-Sister 60   Liver disease Half-Sister        The patient has three children who are cancer free.  She is adopted but did know who her biological family was.  She reports that her maternal half sister and a maternal aunt both had breast cancer.   Ms. Cecere is unaware of previous family history of genetic testing for hereditary cancer risks. There is no reported Ashkenazi Jewish ancestry. There is no known consanguinity.  GENETIC TEST RESULTS: Genetic testing reported out on January 13, 2024 through the CancerNext-Expanded+RNAinsight cancer panel found no pathogenic mutations. The CancerNext-Expanded gene panel offered by [facility] and includes sequencing, rearrangement, and RNA analysis for the following 76 genes: AIP, ALK, APC, ATM, AXIN2, BAP1, BARD1, BMPR1A, BRCA1, BRCA2, BRIP1, CDC73, CDH1, CDK4, CDKN1B, CDKN2A, CEBPA, CHEK2, CTNNA1, DDX41, DICER1, ETV6, FH, FLCN, GATA2, LZTR1, MAX, MBD4, MEN1, MET, MLH1, MSH2, MSH3, MSH6, MUTYH, NF1, NF2, NTHL1, PALB2, PHOX2B, PMS2, POT1, PRKAR1A, PTCH1, PTEN, RAD51C, RAD51D, RB1, RET, RUNX1, SDHA, SDHAF2, SDHB, SDHC, SDHD, SMAD4, SMARCA4, SMARCB1, SMARCE1, STK11, SUFU, TMEM127, TP53, TSC1, TSC2, VHL, and WT1 (sequencing and deletion/duplication); EGFR,  HOXB13, KIT, MITF, PDGFRA, POLD1, and POLE (sequencing only); EPCAM and GREM1 (deletion/duplication only). The  test report has been scanned into EPIC and is located under the Molecular Pathology section of the Results Review tab.  A portion of the result report is included below for reference.     We discussed with Ms. Sarrazin that because current genetic testing is not perfect, it is possible there may be a gene mutation in one of these genes that current testing cannot detect, but that chance is small.  We also discussed, that there could be another gene that has not yet been discovered, or that we have not yet tested, that is responsible for the cancer diagnoses in the family. It is also possible there is a hereditary cause for the cancer in the family that Ms. Koehl did not inherit and therefore was not identified in her testing.  Therefore, it is important to remain in touch with cancer genetics in the future so that we can continue to offer Ms. Nawaz the most up to date genetic testing.   ADDITIONAL GENETIC TESTING: We discussed with Ms. Keough that her genetic testing was fairly extensive.  If there are genes identified to increase cancer risk that can be analyzed in the future, we would be happy to discuss and coordinate this testing at that time.    CANCER SCREENING RECOMMENDATIONS: Ms. Sawyer test result is considered negative (normal).  This means that we have not identified a hereditary cause for her personal and family history of breast cancer at this time. Most cancers happen by chance and this negative test suggests that her personal and family history of breast cancer may fall into this category.    Possible reasons for Ms. Hamberger's negative genetic test include:  1. There may be a gene mutation in one of these genes that current testing methods cannot detect but that chance is small.  2. There could be another gene that has not yet been discovered, or that we have not yet tested, that is responsible for the cancer diagnoses in the family.  3.  There may be no hereditary risk for cancer  in the family. The cancers in Ms. Ebers and/or her family may be sporadic/familial or due to other genetic and environmental factors. 4. It is also possible there is a hereditary cause for the cancer in the family that Ms. Galeana did not inherit.  Therefore, it is recommended she continue to follow the cancer management and screening guidelines provided by her oncology and primary healthcare provider. An individual's cancer risk and medical management are not determined by genetic test results alone. Overall cancer risk assessment incorporates additional factors, including personal medical history, family history, and any available genetic information that may result in a personalized plan for cancer prevention and surveillance  RECOMMENDATIONS FOR FAMILY MEMBERS:   Since she did not inherit a identifiable mutation in a cancer predisposition gene included on this panel, her children could not have inherited a known mutation from her in one of these genes. Individuals in this family might be at some increased risk of developing cancer, over the general population risk, simply due to the family history of cancer.  We recommended women in this family have a yearly mammogram beginning at age 11, or 49 years younger than the earliest onset of cancer, an annual clinical breast exam, and perform monthly breast self-exams. Women in this family should also have a gynecological exam as recommended by their primary provider. All family members  should be referred for colonoscopy starting at age 39, or 26 years younger than the earliest onset of cancer.  FOLLOW-UP: Lastly, we discussed with Ms. Pincus that cancer genetics is a rapidly advancing field and it is possible that new genetic tests will be appropriate for her and/or her family members in the future. We encouraged her to remain in contact with cancer genetics on an annual basis so we can update her personal and family histories and let her know of advances  in cancer genetics that may benefit this family.   Our contact number was provided. Ms. Devins questions were answered to her satisfaction, and she knows she is welcome to call us  at anytime with additional questions or concerns.   Marijo Shove, MS, [facility] Licensed, Certified Genetic Counselor [email or web address]

## 2024-01-15 NOTE — Telephone Encounter (Signed)
Revealed negative genetic testing.  Discussed that we do not know why she has breast cancer or why there is cancer in the family. It could be due to a different gene that we are not testing, or maybe our current technology may not be able to pick something up.  It will be important for her to keep in contact with genetics to keep up with whether additional testing may be needed. 

## 2024-01-22 ENCOUNTER — Encounter (HOSPITAL_BASED_OUTPATIENT_CLINIC_OR_DEPARTMENT_OTHER): Payer: Self-pay | Admitting: General Surgery

## 2024-01-22 ENCOUNTER — Other Ambulatory Visit: Payer: Self-pay

## 2024-01-28 ENCOUNTER — Ambulatory Visit
Admission: RE | Admit: 2024-01-28 | Discharge: 2024-01-28 | Disposition: A | Discharge status: Home / Self Care | Source: Ambulatory Visit | Attending: General Surgery | Admitting: General Surgery

## 2024-01-28 DIAGNOSIS — C50311 Malignant neoplasm of lower-inner quadrant of right female breast: Secondary | ICD-10-CM

## 2024-01-28 DIAGNOSIS — D0511 Intraductal carcinoma in situ of right breast: Secondary | ICD-10-CM | POA: Diagnosis not present

## 2024-01-28 HISTORY — PX: BREAST BIOPSY: SHX20

## 2024-01-28 MED ORDER — CHLORHEXIDINE GLUCONATE CLOTH 2 % EX PADS: 6.0000 | MEDICATED_PAD | Freq: Once | CUTANEOUS | Status: DC

## 2024-01-28 NOTE — Progress Notes (Signed)

## 2024-01-28 NOTE — H&P (Signed)
 REFERRING PHYSICIAN:  Alinda Apley   PROVIDER:  Eppie Hasting, MD   Care Team: Patient Care Team: Loistine Rinne, MD as PCP - General (Internal Medicine) Cherlynn Cornfield Chaya Cord, MD as Consulting Provider (Surgical Oncology) Iruku, Charlotta Cook, MD as Consulting Provider (Hematology and Oncology) Aaron Aas, MD (Radiation Oncology)    MRN: [id number] DOB: [birth year] DATE OF ENCOUNTER: 01/02/2024   Subjective    Chief Complaint: Breast Cancer       History of Present Illness: [name] is a 68 y.o. female who is seen today as an office consultation at the request of Dr. Arno Bibles for evaluation of Breast Cancer .     Patient presents with a new diagnosis of right breast cancer March 2025.  The patient had a screening detected right breast mass.  Diagnostic imaging was performed.  This showed an 8 mm mass at 330 on the right.  A core needle biopsy was performed.  This demonstrated ductal carcinoma in situ.  This is ER and PR positive.   Of note, the patient has a history of right breast cancer in 2011.  She had lumpectomy, sentinel lymph node biopsy, and radiation.  Dr. Linell Rhymes was her surgeon. She did not take antihormonal tx.        Family cancer history - Patient's maternal aunt also had breast cancer. 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     Diagnostic mammogram: 12/21/23 BCG ACR Breast Density Category b: There are scattered areas of fibroglandular density.  FINDINGS: On spot compression imaging, the possible mass noted in the medial right breast persists as and irregular, 7 mm mass, with associated architectural distortion.  On physical exam, there is a small firm mass in the medial right breast between 3 and 4 o'clock.  Targeted ultrasound is performed, showing a small hypoechoic irregular mass at 3:30 o'clock, 4 cm  the nipple, measuring 8 x 4 x 4 mm. There is no internal blood flow on color Doppler analysis. This corresponds in size, shape and location to the mammographic mass. Sonographic imaging of the right axilla demonstrates normal lymph nodes. No enlarged or abnormal lymph nodes.  IMPRESSION: 1. Suspicious 8 mm mass in the right breast at 3:30 o'clock. Tissue sampling is recommended.  RECOMMENDATION: 1. Ultrasound-guided core needle biopsy of the 8 mm mass in the right breast at 3:30 o'clock.  I have discussed the findings and recommendations with the patient. If applicable, a reminder letter will be sent to the patient regarding the next appointment.  BI-RADS CATEGORY  4: Suspicious.   Pathology core needle biopsy: 12/26/23 1. Breast, right, needle core biopsy, 3:30 4 cmfn :       DUCTAL CARCINOMA IN SITU, SOLID AND PAPILLARY TYPES, SUGGESTIVE OF SOLID       PAPILLARY CARCINOMA.       NECROSIS: PRESENT       CALCIFICATIONS: PRESENT       DCIS LENGTH: 0.5 CM    Receptors: Estrogen Receptor:  100%, POSITIVE, STRONG STAINING INTENSITY  Progesterone Receptor:  100%, POSITIVE, STRONG STAINING INTENSITY      Review of Systems: A complete review of systems was obtained from the patient.  I have reviewed this information and discussed as appropriate with the patient.  See HPI as well for other ROS. ROS - positive for some congestion/runny nose.  Medical History: Past Medical History      Past Medical History:  Diagnosis Date   Arthritis     Chronic kidney disease     Diabetes mellitus without complication (CMS/HHS-HCC)     GERD (gastroesophageal reflux disease)     History of cancer     Hypertension          Problem List     Patient Active Problem List  Diagnosis   Malignant neoplasm of lower-inner quadrant of right breast of female, estrogen receptor positive (CMS/HHS-HCC)   History of right breast cancer   Chronic venous insufficiency   Diabetes mellitus type 2 with  complications (CMS/HHS-HCC)   Ductal carcinoma in situ (DCIS) of right breast   Essential hypertension, benign   GERD (gastroesophageal reflux disease)   Hyperlipidemia associated with type 2 diabetes mellitus (CMS/HHS-HCC)   Obesity (BMI 30.0-34.9)   OSA (obstructive sleep apnea)   Primary osteoarthritis of left knee   Vaginal vault prolapse after hysterectomy   Left hip pain   Calculus of gallbladder without cholecystitis without obstruction   Tobacco abuse   Nasal congestion   Neck pain        Past Surgical History       Past Surgical History:  Procedure Laterality Date   APPENDECTOMY N/A 1978   LEFT TOTAL KNEE WITH NAVIGATION Left 06/29/2016   RIGHT TOTAL KNEE ARTHROPLASTY WITH COMPUTER NAVIGATION Right 12/14/2016   L3-4 DECOMPRESSION, REMOVAL OF INTRASPINAL EXTRADURAL FACET CYST N/A 08/23/2018   XI ROBOTIC ASSISTED LAPAROSCOPIC SACROCOLPOPEXY N/A 06/20/2023   CYSTOSCOPY N/A 06/20/2023   Right breast biopsy  Right 12/26/2023        Allergies  No Known Allergies     Medications Ordered Prior to Encounter        Current Outpatient Medications on File Prior to Visit  Medication Sig Dispense Refill   acetaminophen  (TYLENOL ) 500 MG tablet Take 500 mg by mouth every 6 (six) hours as needed for Pain       clobetasoL  (TEMOVATE ) 0.05 % ointment Apply 1 Application topically 2 (two) times daily       estradioL  (ESTRACE ) 0.01 % (0.1 mg/gram) vaginal cream Place 0.5 g vaginally twice a week Place 0.5 g vaginally 2 (two) times a week. Place 0.5g nightly for two weeks then twice a week after       ibuprofen  (MOTRIN ) 600 MG tablet Take 600 mg by mouth every 6 (six) hours as needed       JARDIANCE  10 mg tablet Take 1 tablet by mouth every morning before breakfast       losartan  (COZAAR ) 50 MG tablet Take 50 mg by mouth once daily       polyethylene glycol (MIRALAX ) powder Take 17 g by mouth once daily       spironolactone  (ALDACTONE ) 25 MG tablet Take 25 mg by mouth once daily         No current facility-administered medications on file prior to visit.        Family History  Family History  Family history unknown: Yes        Tobacco Use History  Social History        Tobacco Use  Smoking Status Every Day   Types: Cigarettes  Smokeless Tobacco Never        Social History  Social History         Socioeconomic History   Marital status: Married  Tobacco Use   Smoking status: Every Day  Types: Cigarettes   Smokeless tobacco: Never  Vaping Use   Vaping status: Never Used  Substance and Sexual Activity   Alcohol  use: Never   Drug use: Never    Social Drivers of Acupuncturist Strain: Low Risk  (05/14/2023)    Received from [facility]    Overall Financial Resource Strain (CARDIA)     Difficulty of Paying Living Expenses: Not hard at all  Food Insecurity: No Food Insecurity (05/14/2023)    Received from [facility]    Hunger Vital Sign     Worried About Running Out of Food in the Last Year: Never true     Ran Out of Food in the Last Year: Never true  Transportation Needs: No Transportation Needs (05/14/2023)    Received from [facility] - Transportation     Lack of Transportation (Medical): No     Lack of Transportation (Non-Medical): No  Physical Activity: Sufficiently Active (09/08/2022)    Received [facility]    Exercise Vital Sign     Days of Exercise per Week: 5 days     Minutes of Exercise per Session: 50 min  Stress: No Stress Concern Present (05/14/2023)    Received from [facility]ational [facility] - Occupational Stress Questionnaire     Feeling of Stress : Only a little  Social Connections: Socially Integrated (09/08/2022)    Received from [facility]    Social Connection and Isolation Panel [NHANES]     Frequency of Communication with Friends and Family: More than three times a week     Frequency of Social Gatherings with Friends and Family: More than three times  a week     Attends Religious Services: More than 4 times per year     Active Member of [employer] or Organizations: Yes     Attends Engineer, structural: More than 4 times per year     Marital Status: Married        Objective:         Vitals:    01/02/24 1140  BP: 97/55  Pulse: 63  Resp: 17  Temp: 36.2 C (97.2 F)  Weight: 81.1 kg (178 lb 12.8 oz)  Height: 166.4 cm (5' 5.5")    Body mass index is 29.3 kg/m.   Gen:  No acute distress.  Well nourished and well groomed.   Neurological: Alert and oriented to person, place, and time. Coordination normal.  Head: Normocephalic and atraumatic.  Eyes: Conjunctivae are normal. Pupils are equal, round, and reactive to light. No scleral icterus.  Neck: Normal range of motion. Neck supple. No tracheal deviation or thyromegaly present.  Cardiovascular: Normal rate, regular rhythm, normal heart sounds and intact distal pulses.  Exam reveals no gallop and no friction rub.  No murmur heard. Breast: Right breast smaller than left.  Upper right breast with some thickening of the lumpectomy scar.  Contraction of the right breast.  Lower inner breast at biopsy site has small hematoma.  No palpable masses are present no nipple retraction or nipple discharge is present no axillary lymphadenopathy is present.  Left breast exam is benign. Respiratory: Effort normal.  No respiratory distress. No chest wall tenderness. Breath sounds normal.  No wheezes, rales or rhonchi.  GI: Soft. Bowel sounds are normal. The abdomen is soft and nontender.  There is no rebound and no guarding.  Musculoskeletal: Normal range  of motion. Extremities are nontender.  Lymphadenopathy: No cervical, preauricular, postauricular or axillary adenopathy is present Skin: Skin is warm and dry. No rash noted. No diaphoresis. No erythema. No pallor. No clubbing, cyanosis, or edema.   Psychiatric: Normal mood and affect. Behavior is normal. Judgment and thought content normal.       Labs 12/06/23 CBC normal CMET Cr 1/35/GFR 40.55, Ca 10.6   Assessment and Plan:        ICD-10-CM    1. Malignant neoplasm of lower-inner quadrant of right breast of female, estrogen receptor positive (CMS/HHS-HCC)  C50.311      Z17.0       2. History of right breast cancer  Z85.3         Patient has a new diagnosis of clinical stage 0 right breast cancer that is hormone positive.  This is in the setting of previous right breast cancer.  We discussed the patient in multidisciplinary fashion and given the very small region of DCIS, we feel it is reasonable to offer the patient a repeat lumpectomy assuming that she takes the antihormone treatment.  She did not do this last time.  She is amenable to this and prefers this over mastectomy.  Mastectomy is offered as the traditional treatment.  Pros and cons of mastectomy versus lumpectomy are reviewed with the patient and her family.  The patient understands that omitting antihormone treatment with this strategy would lead to an unacceptably high risk of recurrence and that if she does not desire to take antihormonal treatment that she should have a mastectomy.  Patient prefers a improved recovery.  Patient understands that there would be worsening breast asymmetry but would like the quicker recovery.   Patient is offered genetic testing.   Patient is likely not a candidate for any repeat radiation therapy.  Dr. Jeryl Moris is evaluating the patient and her previous records to see if this is the case.   We will schedule her for seed localized right breast lumpectomy.  I reviewed the seed placement with the patient as well as the procedure of the lumpectomy.  I discussed recovery postop expectations and risk.  I discussed risk of bleeding, infection, chronic pain, dissatisfaction with breast appearance, heart or lung complications, blood clot, or more.  I reviewed lifting restrictions.  Patient understands and wishes to proceed.   Epic orders are written and  posting is submitted to the office.

## 2024-01-29 ENCOUNTER — Other Ambulatory Visit: Payer: Self-pay

## 2024-01-29 ENCOUNTER — Ambulatory Visit (HOSPITAL_BASED_OUTPATIENT_CLINIC_OR_DEPARTMENT_OTHER)
Admission: RE | Admit: 2024-01-29 | Discharge: 2024-01-29 | Disposition: A | Discharge status: Home / Self Care | Attending: General Surgery | Admitting: General Surgery

## 2024-01-29 ENCOUNTER — Ambulatory Visit
Admission: RE | Admit: 2024-01-29 | Discharge: 2024-01-29 | Disposition: A | Discharge status: Home / Self Care | Source: Ambulatory Visit | Attending: General Surgery | Admitting: General Surgery

## 2024-01-29 ENCOUNTER — Ambulatory Visit (HOSPITAL_BASED_OUTPATIENT_CLINIC_OR_DEPARTMENT_OTHER): Payer: Self-pay | Admitting: Anesthesiology

## 2024-01-29 ENCOUNTER — Encounter (HOSPITAL_BASED_OUTPATIENT_CLINIC_OR_DEPARTMENT_OTHER): Payer: Self-pay | Admitting: General Surgery

## 2024-01-29 ENCOUNTER — Encounter (HOSPITAL_BASED_OUTPATIENT_CLINIC_OR_DEPARTMENT_OTHER)
Admission: RE | Disposition: A | Discharge status: Home / Self Care | Payer: Self-pay | Source: Home / Self Care | Attending: General Surgery

## 2024-01-29 DIAGNOSIS — E119 Type 2 diabetes mellitus without complications: Secondary | ICD-10-CM

## 2024-01-29 DIAGNOSIS — C50911 Malignant neoplasm of unspecified site of right female breast: Secondary | ICD-10-CM | POA: Diagnosis not present

## 2024-01-29 DIAGNOSIS — Z923 Personal history of irradiation: Secondary | ICD-10-CM | POA: Insufficient documentation

## 2024-01-29 DIAGNOSIS — N6031 Fibrosclerosis of right breast: Secondary | ICD-10-CM | POA: Diagnosis not present

## 2024-01-29 DIAGNOSIS — C50311 Malignant neoplasm of lower-inner quadrant of right female breast: Secondary | ICD-10-CM

## 2024-01-29 DIAGNOSIS — Z1721 Progesterone receptor positive status: Secondary | ICD-10-CM | POA: Insufficient documentation

## 2024-01-29 DIAGNOSIS — G4733 Obstructive sleep apnea (adult) (pediatric): Secondary | ICD-10-CM | POA: Insufficient documentation

## 2024-01-29 DIAGNOSIS — K219 Gastro-esophageal reflux disease without esophagitis: Secondary | ICD-10-CM | POA: Insufficient documentation

## 2024-01-29 DIAGNOSIS — F17210 Nicotine dependence, cigarettes, uncomplicated: Secondary | ICD-10-CM

## 2024-01-29 DIAGNOSIS — Z9221 Personal history of antineoplastic chemotherapy: Secondary | ICD-10-CM | POA: Diagnosis not present

## 2024-01-29 DIAGNOSIS — D0511 Intraductal carcinoma in situ of right breast: Secondary | ICD-10-CM | POA: Diagnosis not present

## 2024-01-29 DIAGNOSIS — Z6829 Body mass index (BMI) 29.0-29.9, adult: Secondary | ICD-10-CM | POA: Insufficient documentation

## 2024-01-29 DIAGNOSIS — Z01818 Encounter for other preprocedural examination: Secondary | ICD-10-CM

## 2024-01-29 DIAGNOSIS — E669 Obesity, unspecified: Secondary | ICD-10-CM | POA: Insufficient documentation

## 2024-01-29 DIAGNOSIS — I10 Essential (primary) hypertension: Secondary | ICD-10-CM

## 2024-01-29 DIAGNOSIS — I129 Hypertensive chronic kidney disease with stage 1 through stage 4 chronic kidney disease, or unspecified chronic kidney disease: Secondary | ICD-10-CM | POA: Insufficient documentation

## 2024-01-29 DIAGNOSIS — E1122 Type 2 diabetes mellitus with diabetic chronic kidney disease: Secondary | ICD-10-CM | POA: Insufficient documentation

## 2024-01-29 DIAGNOSIS — N181 Chronic kidney disease, stage 1: Secondary | ICD-10-CM | POA: Diagnosis not present

## 2024-01-29 DIAGNOSIS — Z170 Estrogen receptor positive status [ER+]: Secondary | ICD-10-CM | POA: Insufficient documentation

## 2024-01-29 HISTORY — PX: BREAST LUMPECTOMY WITH RADIOACTIVE SEED LOCALIZATION: SHX6424

## 2024-01-29 LAB — GLUCOSE, CAPILLARY
Glucose-Capillary: 99 mg/dL (ref 70–99)
Glucose-Capillary: 107 mg/dL — ABNORMAL HIGH (ref 70–99)

## 2024-01-29 SURGERY — BREAST LUMPECTOMY WITH RADIOACTIVE SEED LOCALIZATION
Anesthesia: General | Site: Breast | Laterality: Right

## 2024-01-29 MED ORDER — HYDROMORPHONE HCL 1 MG/ML IJ SOLN: 0.2500 mg | INTRAMUSCULAR | Status: DC | PRN

## 2024-01-29 MED ORDER — LIDOCAINE-EPINEPHRINE (PF) 1 %-1:200000 IJ SOLN
INTRAMUSCULAR | Status: DC | PRN
  Administered 2024-01-29: 40 mL via INTRAMUSCULAR

## 2024-01-29 MED ORDER — ONDANSETRON HCL 4 MG/2ML IJ SOLN
INTRAMUSCULAR | Status: DC | PRN
  Administered 2024-01-29: 4 mg via INTRAVENOUS

## 2024-01-29 MED ORDER — DEXAMETHASONE SODIUM PHOSPHATE 4 MG/ML IJ SOLN
INTRAMUSCULAR | Status: DC | PRN
  Administered 2024-01-29: 5 mg via INTRAVENOUS

## 2024-01-29 MED ORDER — OXYCODONE HCL 5 MG PO TABS: 5.0000 mg | ORAL_TABLET | Freq: Once | ORAL | Status: DC | PRN

## 2024-01-29 MED ORDER — FENTANYL CITRATE (PF) 100 MCG/2ML IJ SOLN
INTRAMUSCULAR | Status: AC
  Filled 2024-01-29: qty 2

## 2024-01-29 MED ORDER — EPHEDRINE 5 MG/ML INJ
INTRAVENOUS | Status: AC
  Filled 2024-01-29: qty 5

## 2024-01-29 MED ORDER — EPHEDRINE SULFATE (PRESSORS) 50 MG/ML IJ SOLN
INTRAMUSCULAR | Status: DC | PRN
  Administered 2024-01-29 (×2): 10 mg via INTRAVENOUS

## 2024-01-29 MED ORDER — CEFAZOLIN SODIUM-DEXTROSE 2-4 GM/100ML-% IV SOLN
INTRAVENOUS | Status: AC
  Filled 2024-01-29: qty 100

## 2024-01-29 MED ORDER — PHENYLEPHRINE 80 MCG/ML (10ML) SYRINGE FOR IV PUSH (FOR BLOOD PRESSURE SUPPORT)
PREFILLED_SYRINGE | INTRAVENOUS | Status: AC
  Filled 2024-01-29: qty 10

## 2024-01-29 MED ORDER — OXYCODONE HCL 5 MG/5ML PO SOLN: 5.0000 mg | Freq: Once | ORAL | Status: DC | PRN

## 2024-01-29 MED ORDER — MIDAZOLAM HCL 2 MG/2ML IJ SOLN
INTRAMUSCULAR | Status: AC
  Filled 2024-01-29: qty 2

## 2024-01-29 MED ORDER — OXYCODONE HCL 5 MG PO TABS: 5.0000 mg | ORAL_TABLET | Freq: Four times a day (QID) | ORAL | 0 refills | Status: DC | PRN

## 2024-01-29 MED ORDER — ATROPINE SULFATE 0.4 MG/ML IV SOLN
INTRAVENOUS | Status: AC
  Filled 2024-01-29: qty 1

## 2024-01-29 MED ORDER — LIDOCAINE 2% (20 MG/ML) 5 ML SYRINGE
INTRAMUSCULAR | Status: AC
  Filled 2024-01-29: qty 5

## 2024-01-29 MED ORDER — SODIUM CHLORIDE 0.9 % IV SOLN
12.5000 mg | INTRAVENOUS | Status: DC | PRN
  Filled 2024-01-29: qty 0.5

## 2024-01-29 MED ORDER — ACETAMINOPHEN 500 MG PO TABS
ORAL_TABLET | ORAL | Status: AC
  Filled 2024-01-29: qty 2

## 2024-01-29 MED ORDER — LIDOCAINE HCL (CARDIAC) PF 100 MG/5ML IV SOSY
PREFILLED_SYRINGE | INTRAVENOUS | Status: DC | PRN
Start: 2024-01-29 — End: 2024-01-29
  Administered 2024-01-29: 40 mg via INTRAVENOUS

## 2024-01-29 MED ORDER — PROPOFOL 10 MG/ML IV BOLUS
INTRAVENOUS | Status: DC | PRN
  Administered 2024-01-29: 200 mg via INTRAVENOUS

## 2024-01-29 MED ORDER — MIDAZOLAM HCL 5 MG/5ML IJ SOLN
INTRAMUSCULAR | Status: DC | PRN
  Administered 2024-01-29: 1 mg via INTRAVENOUS

## 2024-01-29 MED ORDER — DEXAMETHASONE SODIUM PHOSPHATE 10 MG/ML IJ SOLN
INTRAMUSCULAR | Status: AC
  Filled 2024-01-29: qty 1

## 2024-01-29 MED ORDER — ACETAMINOPHEN 500 MG PO TABS
1000.0000 mg | ORAL_TABLET | ORAL | Status: AC
  Administered 2024-01-29: 1000 mg via ORAL

## 2024-01-29 MED ORDER — SUCCINYLCHOLINE CHLORIDE 200 MG/10ML IV SOSY
PREFILLED_SYRINGE | INTRAVENOUS | Status: AC
  Filled 2024-01-29: qty 10

## 2024-01-29 MED ORDER — BUPIVACAINE HCL (PF) 0.25 % IJ SOLN
INTRAMUSCULAR | Status: AC
Start: 2024-01-29 — End: ?
  Filled 2024-01-29: qty 30

## 2024-01-29 MED ORDER — FENTANYL CITRATE (PF) 100 MCG/2ML IJ SOLN
INTRAMUSCULAR | Status: DC | PRN
  Administered 2024-01-29: 50 ug via INTRAVENOUS

## 2024-01-29 MED ORDER — LACTATED RINGERS IV SOLN
INTRAVENOUS | Status: DC | PRN
Start: 2024-01-29 — End: 2024-01-29

## 2024-01-29 MED ORDER — CEFAZOLIN SODIUM-DEXTROSE 2-4 GM/100ML-% IV SOLN
2.0000 g | INTRAVENOUS | Status: AC
  Administered 2024-01-29: 2 g via INTRAVENOUS

## 2024-01-29 MED ORDER — LACTATED RINGERS IV SOLN: INTRAVENOUS | Status: DC

## 2024-01-29 MED ORDER — LIDOCAINE-EPINEPHRINE (PF) 1 %-1:200000 IJ SOLN
INTRAMUSCULAR | Status: AC
Start: 2024-01-29 — End: ?
  Filled 2024-01-29: qty 30

## 2024-01-29 MED ORDER — AMISULPRIDE (ANTIEMETIC) 5 MG/2ML IV SOLN: 10.0000 mg | Freq: Once | INTRAVENOUS | Status: DC | PRN

## 2024-01-29 MED ORDER — ONDANSETRON HCL 4 MG/2ML IJ SOLN
INTRAMUSCULAR | Status: AC
  Filled 2024-01-29: qty 2

## 2024-01-29 SURGICAL SUPPLY — 42 items
BLADE SURG 10 STRL SS (BLADE) ×2 IMPLANT
BLADE SURG 15 STRL LF DISP TIS (BLADE) IMPLANT
CANISTER SUC SOCK COL 7IN (MISCELLANEOUS) IMPLANT
CANISTER SUCT 1200ML W/VALVE (MISCELLANEOUS) IMPLANT
CHLORAPREP W/TINT 26 (MISCELLANEOUS) ×2 IMPLANT
CLIP TI LARGE 6 (CLIP) ×2 IMPLANT
CLIP TI MEDIUM 6 (CLIP) IMPLANT
COVER BACK TABLE 60X90IN (DRAPES) ×2 IMPLANT
COVER MAYO STAND STRL (DRAPES) ×2 IMPLANT
COVER PROBE CYLINDRICAL 5X96 (MISCELLANEOUS) ×2 IMPLANT
DERMABOND ADVANCED .7 DNX12 (GAUZE/BANDAGES/DRESSINGS) ×2 IMPLANT
DRAPE LAPAROSCOPIC ABDOMINAL (DRAPES) ×2 IMPLANT
DRAPE UTILITY XL STRL (DRAPES) ×2 IMPLANT
ELECT COATED BLADE 2.86 ST (ELECTRODE) ×2 IMPLANT
ELECTRODE REM PT RTRN 9FT ADLT (ELECTROSURGICAL) ×2 IMPLANT
GAUZE SPONGE 4X4 12PLY STRL LF (GAUZE/BANDAGES/DRESSINGS) ×2 IMPLANT
GLOVE BIO SURGEON STRL SZ 6 (GLOVE) ×2 IMPLANT
GLOVE BIOGEL PI IND STRL 6.5 (GLOVE) ×2 IMPLANT
GLOVE BIOGEL PI IND STRL 7.0 (GLOVE) IMPLANT
GLOVE SURG SYN 7.0 (GLOVE) ×1 IMPLANT
GLOVE SURG SYN 7.0 PF PI (GLOVE) IMPLANT
GOWN STRL REUS W/ TWL LRG LVL3 (GOWN DISPOSABLE) ×2 IMPLANT
GOWN STRL REUS W/ TWL XL LVL3 (GOWN DISPOSABLE) ×2 IMPLANT
KIT MARKER MARGIN INK (KITS) ×2 IMPLANT
LIGHT WAVEGUIDE WIDE FLAT (MISCELLANEOUS) IMPLANT
NDL HYPO 25X1 1.5 SAFETY (NEEDLE) ×2 IMPLANT
NEEDLE HYPO 25X1 1.5 SAFETY (NEEDLE) ×1 IMPLANT
NS IRRIG 1000ML POUR BTL (IV SOLUTION) ×2 IMPLANT
PACK BASIN DAY SURGERY FS (CUSTOM PROCEDURE TRAY) ×2 IMPLANT
PENCIL SMOKE EVACUATOR (MISCELLANEOUS) ×2 IMPLANT
SLEEVE SCD COMPRESS KNEE MED (STOCKING) ×2 IMPLANT
SPONGE T-LAP 18X18 ~~LOC~~+RFID (SPONGE) ×2 IMPLANT
STRIP CLOSURE SKIN 1/2X4 (GAUZE/BANDAGES/DRESSINGS) ×2 IMPLANT
SUT MNCRL AB 4-0 PS2 18 (SUTURE) ×2 IMPLANT
SUT SILK 2 0 SH (SUTURE) IMPLANT
SUT VIC AB 2-0 SH 27XBRD (SUTURE) ×2 IMPLANT
SUT VIC AB 3-0 SH 27X BRD (SUTURE) ×2 IMPLANT
SYR CONTROL 10ML LL (SYRINGE) ×2 IMPLANT
TOWEL GREEN STERILE FF (TOWEL DISPOSABLE) ×2 IMPLANT
TRAY FAXITRON CT DISP (TRAY / TRAY PROCEDURE) ×2 IMPLANT
TUBE CONNECTING 20X1/4 (TUBING) IMPLANT
YANKAUER SUCT BULB TIP NO VENT (SUCTIONS) IMPLANT

## 2024-01-29 NOTE — Op Note (Signed)
 Right Breast Radioactive seed localized lumpectomy  Indications: This patient presents with history of right breast cancer, lower inner quadrant 8 mm cTis, ductal carcinoma in situ, solid/papillary types with necrosis and calcification, receptors 100% ER and PR positive with strong staining; prior right breast cancer 2011 with lumpectomy, SLN bx and XRT.  Pre-operative Diagnosis: right breast cancer cTis  Post-operative Diagnosis: Same  Surgeon: [name]   Anesthesia: General endotracheal anesthesia  ASA Class: 3  Procedure Details  The patient was seen in the Holding Room. The risks, benefits, complications, treatment options, and expected outcomes were discussed with the patient. The possibilities of bleeding, infection, the need for additional procedures, failure to diagnose a condition, and creating a complication requiring other procedures or operations were discussed with the patient. The patient concurred with the proposed plan, giving informed consent.  The site of surgery properly noted/marked. The patient was taken to Operating Room # 8, identified, and the procedure verified as right breast seed localized lumpectomy.  The right breast and chest were prepped and draped in standard fashion. A medial circumareolar incision was made near the previously placed radioactive seed.  Dissection was carried down around the point of maximum signal intensity. The cautery was used to perform the dissection.   The specimen was inked with the margin marker paint kit.    Specimen radiography confirmed inclusion of the mammographic lesion, the clip, and the seed.  The background signal in the breast was zero.  Shave margins were taken at all borders of the cavity.    Hemostasis was achieved with cautery.  The cavity was marked with one clip in the center of the cavity and 2-0 vicryl was used to close down the cavity.  Local was administered in the tissue surrounding the cavity.  The wound was  irrigated and closed with 3-0 vicryl interrupted deep dermal sutures and 4-0 monocryl running subcuticular suture.      Sterile dressings were applied. At the end of the operation, all sponge, instrument, and needle counts were correct.   Findings: Seed, clip in specimen.  anterior margin is skin.    Estimated Blood Loss:  min         Specimens: right breast tissue with seed, additional superior, medial, inferior, lateral, posterior, anterior margins.          Complications:  None; patient tolerated the procedure well.         Disposition: PACU - hemodynamically stable.         Condition: stable

## 2024-01-29 NOTE — Anesthesia Postprocedure Evaluation (Signed)
 Anesthesia Post Note  Patient: ALFIE SVETLIK  Procedure(s) Performed: BREAST LUMPECTOMY WITH RADIOACTIVE SEED LOCALIZATION (Right: Breast)     Patient location during evaluation: PACU Anesthesia Type: General Level of consciousness: awake and alert Pain management: pain level controlled Vital Signs Assessment: post-procedure vital signs reviewed and stable Respiratory status: spontaneous breathing, nonlabored ventilation and respiratory function stable Cardiovascular status: blood pressure returned to baseline and stable Postop Assessment: no apparent nausea or vomiting Anesthetic complications: no   No notable events documented.  Last Vitals:  Vitals:   01/29/24 1130 01/29/24 1152  BP:  119/68  Pulse: 66 61  Resp: 10 17  Temp:  36.8 C  SpO2: 95% 98%    Last Pain:  Vitals:   01/29/24 1152  TempSrc: Temporal  PainSc: 0-No pain                 Earvin Goldberg

## 2024-01-29 NOTE — Transfer of Care (Signed)
 Immediate Anesthesia Transfer of Care Note  Patient: [name]  Procedure(s) Performed: BREAST LUMPECTOMY WITH RADIOACTIVE SEED LOCALIZATION (Right: Breast)  Patient Location: PACU  Anesthesia Type:General  Level of Consciousness: awake, alert , oriented, drowsy, and patient cooperative  Airway & Oxygen Therapy: Patient Spontanous Breathing and Patient connected to face mask oxygen  Post-op Assessment: Report given to RN and Post -op Vital signs reviewed and stable  Post vital signs: Reviewed and stable  Last Vitals:  Vitals Value Taken Time  BP    Temp    Pulse    Resp    SpO2      Last Pain:  Vitals:   01/29/24 0802  TempSrc: Temporal  PainSc: 0-No pain      Patients Stated Pain Goal: 3 (01/29/24 0802)  Complications: No notable events documented.

## 2024-01-29 NOTE — Anesthesia Preprocedure Evaluation (Signed)
 Anesthesia Evaluation  Patient identified by MRN, date of birth, ID band Patient awake    Reviewed: Allergy & Precautions, H&P , NPO status , Patient's Chart, lab work & pertinent test results  Airway Mallampati: II  TM Distance: <3 FB Neck ROM: Full    Dental no notable dental hx.    Pulmonary sleep apnea , Current Smoker and Patient abstained from smoking.   Pulmonary exam normal breath sounds clear to auscultation       Cardiovascular hypertension, Pt. on medications Normal cardiovascular exam Rhythm:Regular Rate:Normal     Neuro/Psych negative neurological ROS  negative psych ROS   GI/Hepatic Neg liver ROS,GERD  ,,  Endo/Other  negative endocrine ROSdiabetes, Type 2    Renal/GU Renal InsufficiencyRenal disease  negative genitourinary   Musculoskeletal  (+) Arthritis , Osteoarthritis,    Abdominal   Peds negative pediatric ROS (+)  Hematology negative hematology ROS (+)   Anesthesia Other Findings   Reproductive/Obstetrics negative OB ROS                             Anesthesia Physical Anesthesia Plan  ASA: 3  Anesthesia Plan: General   Post-op Pain Management: Tylenol  PO (pre-op)*   Induction: Intravenous  PONV Risk Score and Plan: 2 and Ondansetron , Treatment may vary due to age or medical condition and Midazolam   Airway Management Planned: LMA  Additional Equipment:   Intra-op Plan:   Post-operative Plan: Extubation in OR  Informed Consent: I have reviewed the patients History and Physical, chart, labs and discussed the procedure including the risks, benefits and alternatives for the proposed anesthesia with the patient or authorized representative who has indicated his/her understanding and acceptance.     Dental advisory given  Plan Discussed with: CRNA and Surgeon  Anesthesia Plan Comments:        Anesthesia Quick Evaluation

## 2024-01-29 NOTE — Discharge Instructions (Addendum)
 [facility] Office Phone Number [phone]  BREAST BIOPSY/ PARTIAL MASTECTOMY: POST OP INSTRUCTIONS  Always review your discharge instruction sheet given to you by the facility where your surgery was performed.  IF YOU HAVE DISABILITY OR FAMILY LEAVE FORMS, YOU MUST BRING THEM TO THE OFFICE FOR PROCESSING.  DO NOT GIVE THEM TO YOUR DOCTOR.  Take 2 tylenol  (acetominophen) three times a day for 3 days.  If you still have pain, add ibuprofen  with food in between if able to take this (if you have kidney issues or stomach issues, do not take ibuprofen ).  If both of those are not enough, add the narcotic pain pill.  If you find you are needing a lot of this overnight after surgery, call the next morning for a refill.    Prescriptions will not be filled after 5pm or on week-ends. Take your usually prescribed medications unless otherwise directed You should eat very light the first 24 hours after surgery, such as soup, crackers, pudding, etc.  Resume your normal diet the day after surgery. Most patients will experience some swelling and bruising in the breast.  Ice packs and a good support bra will help.  Swelling and bruising can take several days to resolve.  It is common to experience some constipation if taking pain medication after surgery.  Increasing fluid intake and taking a stool softener will usually help or prevent this problem from occurring.  A mild laxative (Milk of Magnesia or Miralax ) should be taken according to package directions if there are no bowel movements after 48 hours. Unless discharge instructions indicate otherwise, you may remove your bandages 48 hours after surgery, and you may shower at that time.  You may have steri-strips (small skin tapes) in place directly over the incision.  These strips should be left on the skin at least for for 7-10 days.    ACTIVITIES:  You may resume regular daily activities (gradually increasing) beginning the next day.  Wearing a  good support bra or sports bra (or the breast binder) minimizes pain and swelling.  You may have sexual intercourse when it is comfortable. No heavy lifting for 1-2 weeks (not over around 10 pounds).  You may drive when you no longer are taking prescription pain medication, you can comfortably wear a seatbelt, and you can safely maneuver your car and apply brakes. RETURN TO WORK:  __________3-14 days depending on job. _______________ [name] should see your doctor in the office for a follow-up appointment approximately two weeks after your surgery.  Your doctor's nurse will typically make your follow-up appointment when she calls you with your pathology report.  Expect your pathology report 3-4 business days after your surgery.  You may call to check if you do not hear from us  after three days.   WHEN TO CALL YOUR DOCTOR: Fever over 101.0 Nausea and/or vomiting. Extreme swelling or bruising. Continued bleeding from incision. Increased pain, redness, or drainage from the incision.  The clinic staff is available to answer your questions during regular business hours.  Please don't hesitate to call and ask to speak to one of the nurses for clinical concerns.  If you have a medical emergency, go to the nearest emergency room or call 911.  A surgeon from [facility] is always on call at the hospital.  For further questions, please visit [email or web address]    Post Anesthesia Home Care Instructions  Activity: Get plenty of rest for the remainder of the day. A responsible individual must stay  with you for 24 hours following the procedure.  For the next 24 hours, DO NOT: -Drive a car -Advertising copywriter -Drink alcoholic beverages -Take any medication unless instructed by your physician -Make any legal decisions or sign important papers.  Meals: Start with liquid foods such as gelatin or soup. Progress to regular foods as tolerated. Avoid greasy, spicy, heavy foods. If nausea  and/or vomiting occur, drink only clear liquids until the nausea and/or vomiting subsides. Call your physician if vomiting continues.  Special Instructions/Symptoms: Your throat may feel dry or sore from the anesthesia or the breathing tube placed in your throat during surgery. If this causes discomfort, gargle with warm salt water . The discomfort should disappear within 24 hours.  If you had a scopolamine  patch placed behind your ear for the management of post- operative nausea and/or vomiting:  1. The medication in the patch is effective for 72 hours, after which it should be removed.  Wrap patch in a tissue and discard in the trash. Wash hands thoroughly with soap and water . 2. You may remove the patch earlier than 72 hours if you experience unpleasant side effects which may include dry mouth, dizziness or visual disturbances. 3. Avoid touching the patch. Wash your hands with soap and water  after contact with the patch.    *May have Tylenol  today at 2pm*

## 2024-01-29 NOTE — Anesthesia Procedure Notes (Signed)
 Procedure Name: LMA Insertion Date/Time: 01/29/2024 9:58 AM  Performed by: Eugenia Hess, CRNAPre-anesthesia Checklist: Patient identified, Emergency Drugs available, Suction available and Patient being monitored Patient Re-evaluated:Patient Re-evaluated prior to induction Oxygen Delivery Method: Circle system utilized Preoxygenation: Pre-oxygenation with 100% oxygen Induction Type: IV induction Ventilation: Mask ventilation without difficulty LMA: LMA inserted LMA Size: 4.0 Number of attempts: 1 Airway Equipment and Method: Bite block Placement Confirmation: positive ETCO2 Tube secured with: Tape Dental Injury: Teeth and Oropharynx as per pre-operative assessment

## 2024-01-29 NOTE — Interval H&P Note (Signed)
 History and Physical Interval Note:  01/29/2024 8:40 AM  [name]  has presented today for surgery, with the diagnosis of RIGHT BREAST CANCER.  The various methods of treatment have been discussed with the patient and family. After consideration of risks, benefits and other options for treatment, the patient has consented to  Procedure(s) with comments: BREAST LUMPECTOMY WITH RADIOACTIVE SEED LOCALIZATION (Right) - RIGHT BREAST SEED LOCALIZED LUMPECTOMY as a surgical intervention.  The patient's history has been reviewed, patient examined, no change in status, stable for surgery.  I have reviewed the patient's chart and labs.  Questions were answered to the patient's satisfaction.     [name]

## 2024-01-31 LAB — SURGICAL PATHOLOGY

## 2024-02-01 ENCOUNTER — Encounter: Payer: Self-pay | Admitting: *Deleted

## 2024-02-01 DIAGNOSIS — C50911 Malignant neoplasm of unspecified site of right female breast: Secondary | ICD-10-CM | POA: Diagnosis not present

## 2024-02-06 ENCOUNTER — Other Ambulatory Visit: Payer: Self-pay | Admitting: Internal Medicine

## 2024-02-20 ENCOUNTER — Encounter: Payer: Self-pay | Admitting: Radiology

## 2024-02-20 ENCOUNTER — Ambulatory Visit

## 2024-02-20 NOTE — Progress Notes (Signed)
 Location of Breast Cancer:Right Breast                                                                             Pain score:0 Histology per Pathology Report:   FINAL MICROSCOPIC DIAGNOSIS:  A. BREAST, RIGHT, LUMPECTOMY:      Ductal carcinoma in situ, solid type, intermediate nuclear grade, with necrosis DCIS greatest dimension: 13 mm (DCIS is present on 3/6 blocks) Margins:  Positive           Closest margin: Medial margin is positive for DCIS Prognostic markers: ER positive, PR positive Biopsy site and clip (ribbon shaped) identified Other findings: None See oncology table  B. BREAST, RIGHT ADDITIONAL SUPERIOR MARGIN, EXCISION:      Benign breast tissue with stromal fibrosis.      Negative for atypia or malignancy.  C. BREAST, RIGHT ADDITIONAL MEDIAL MARGIN, EXCISION:      Ductal carcinoma in situ, solid type, intermediate nuclear grade.      DCIS greatest dimension: 3.5 mm      New true medial margin if free of DCIS.           DCIS is 0.3 mm from the new true medial margin.  D. BREAST, RIGHT ADDITIONAL INFERIOR MARGIN, EXCISION:      Benign breast tissue.      Negative for atypia or malignancy.  E. BREAST, RIGHT ADDITIONAL LATERAL MARGIN, EXCISION:      Benign breast tissue.      Negative for atypia or malignancy.  F. BREAST, RIGHT ADDITIONAL ANTERIOR MARGIN, EXCISION:      Benign breast tissue.      Negative for atypia or malignancy.  G. BREAST, RIGHT ADDITIONAL POSTERIOR MARGIN, EXCISION:      Benign breast tissue.      Negative for atypia or malignancy.  Receptor Status: ER(100%, POSITIVE), PR (100%, POSITIVE)  Did patient present with symptoms (if so, please note symptoms) or was this found on screening mammography?:   Past/Anticipated interventions by surgeon, if ZOX:WRUEA BREAST LUMPECTOMY WITH RADIOACTIVE SEED LOCALIZATION with [name], MD   Past/Anticipated interventions by medical oncology, if any:  Lymphedema issues, if any:  Denies  Pain  issues, if any: Denies  Prior radiation? Yes, previous radiation was in  2011. Pacemaker/ICD? No Possible current pregnancy?No Is the patient on methotrexate? No  Current Complaints / other details:  None at this time.  BP 116/84 (BP Location: Left Arm, Patient Position: Sitting, Cuff Size: Normal)   Pulse 74   Temp (!) 97.1 F (36.2 C) (Temporal)   Resp 18   Ht 5' 5.5" (1.664 m)   SpO2 100%   BMI 27.71 kg/m   Note completed by: Sumner Ends, LPN

## 2024-02-21 ENCOUNTER — Ambulatory Visit: Admitting: Obstetrics and Gynecology

## 2024-02-22 ENCOUNTER — Ambulatory Visit (INDEPENDENT_AMBULATORY_CARE_PROVIDER_SITE_OTHER)
Admission: RE | Admit: 2024-02-22 | Discharge: 2024-02-22 | Disposition: A | Discharge status: Home / Self Care | Source: Ambulatory Visit | Attending: Radiology | Admitting: Radiology

## 2024-02-22 ENCOUNTER — Ambulatory Visit
Admission: RE | Admit: 2024-02-22 | Discharge: 2024-02-22 | Disposition: A | Discharge status: Home / Self Care | Source: Ambulatory Visit | Attending: Radiation Oncology | Admitting: Radiation Oncology

## 2024-02-22 VITALS — BP 116/84 | HR 74 | Temp 97.1°F | Resp 18 | Ht 65.5 in

## 2024-02-22 DIAGNOSIS — Z8616 Personal history of COVID-19: Secondary | ICD-10-CM | POA: Diagnosis not present

## 2024-02-22 DIAGNOSIS — Z170 Estrogen receptor positive status [ER+]: Secondary | ICD-10-CM | POA: Insufficient documentation

## 2024-02-22 DIAGNOSIS — D0511 Intraductal carcinoma in situ of right breast: Secondary | ICD-10-CM | POA: Insufficient documentation

## 2024-02-22 DIAGNOSIS — F17210 Nicotine dependence, cigarettes, uncomplicated: Secondary | ICD-10-CM | POA: Diagnosis not present

## 2024-02-22 DIAGNOSIS — Z803 Family history of malignant neoplasm of breast: Secondary | ICD-10-CM | POA: Insufficient documentation

## 2024-02-22 DIAGNOSIS — Z9221 Personal history of antineoplastic chemotherapy: Secondary | ICD-10-CM | POA: Insufficient documentation

## 2024-02-22 DIAGNOSIS — Z923 Personal history of irradiation: Secondary | ICD-10-CM | POA: Insufficient documentation

## 2024-02-22 DIAGNOSIS — I129 Hypertensive chronic kidney disease with stage 1 through stage 4 chronic kidney disease, or unspecified chronic kidney disease: Secondary | ICD-10-CM | POA: Diagnosis not present

## 2024-02-22 DIAGNOSIS — Z79899 Other long term (current) drug therapy: Secondary | ICD-10-CM | POA: Insufficient documentation

## 2024-02-22 DIAGNOSIS — Z8701 Personal history of pneumonia (recurrent): Secondary | ICD-10-CM | POA: Insufficient documentation

## 2024-02-22 DIAGNOSIS — E1122 Type 2 diabetes mellitus with diabetic chronic kidney disease: Secondary | ICD-10-CM | POA: Insufficient documentation

## 2024-02-22 DIAGNOSIS — M1990 Unspecified osteoarthritis, unspecified site: Secondary | ICD-10-CM | POA: Insufficient documentation

## 2024-02-22 DIAGNOSIS — N181 Chronic kidney disease, stage 1: Secondary | ICD-10-CM | POA: Diagnosis not present

## 2024-02-22 NOTE — Progress Notes (Signed)
 Radiation Oncology         ([phone]) [phone] ________________________________  Name: [name]        MRN: [id number]  Date of Service: 02/22/2024 DOB: 1956-08-03  LK:[id number], Marjory Signs, MD  Murleen Arms, MD     REFERRING PHYSICIAN: Murleen Arms, MD   DIAGNOSIS: The encounter diagnosis was Ductal carcinoma in situ (DCIS) of right breast.   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 female with a history of right sided breast cancer; s/p lumpectomy and radiation last seen in the multidisciplinary breast clinic for a new diagnosis of right breast cancer.   Since the patient was last seen by us , she proceeded with a right lumpectomy under the care of Dr.Byerly on 01/29/2024.  Surgical pathology demonstrated intermediate grade ductal carcinoma in situ with necrosis measuring 1.3 cm in greatest dimension.  All final margins were negative for DCIS, with the closest margin being 0.3 mm from the medial margin.  Prognostic indicators were significant for ER and PR 100% positive with strong staining intensity.  Patient discussed adjuvant antiestrogen therapy with Dr. Arno Bibles in our multidisciplinary breast clinic on 01/02/2024. It does not appear she has been scheduled to meet with her since the surgery.  Patient reports to be doing well overall today.  She notes some breast tenderness and firmness, but otherwise denies any breast pain, swelling, or issues with range of motion in her right shoulder.  PREVIOUS RADIATION THERAPY: Yes   Patient underwent radiation to her left breast in 2011. Records are unavailable at this time, but patient reports to have received 37 fractions.    PAST MEDICAL HISTORY:  Past Medical History:  Diagnosis Date   Breast cancer (HCC)  2011   right breast with axillary lymph node removal   Breast cancer (HCC) 01/2024   DCIS right breast   Chronic kidney disease stage 1    Diabetes mellitus, type II (HCC)    type 2   Family history of breast cancer    History of COVID-19 05/01/2023   Hypertension    Osteoarthritis of both knees    Personal history of chemotherapy    Personal history of radiation therapy    Pneumonia    in early 1980's   Wears glasses        PAST SURGICAL HISTORY: Past Surgical History:  Procedure Laterality Date   ABDOMINAL HYSTERECTOMY  1979   APPENDECTOMY  1978   BLADDER SUSPENSION     BREAST BIOPSY Right 12/26/2023   US  RT BREAST BX W LOC DEV 1ST LESION IMG BX SPEC US  GUIDE 12/26/2023 [facility]   BREAST BIOPSY  01/28/2024   MM RT RADIOACTIVE SEED LOC MAMMO GUIDE 01/28/2024 [facility]   BREAST LUMPECTOMY Right 2011   BREAST LUMPECTOMY WITH RADIOACTIVE SEED LOCALIZATION Right 01/29/2024   Procedure: BREAST LUMPECTOMY WITH RADIOACTIVE SEED LOCALIZATION;  Surgeon: [name], MD;  Location: [facility];  Service: General;  Laterality: Right;  RIGHT BREAST SEED LOCALIZED LUMPECTOMY   CYSTOSCOPY N/A 06/20/2023   Procedure: CYSTOSCOPY;  Surgeon: Arma Lamp, MD;  Location: [facility];  Service: Gynecology;  Laterality: N/A;   JOINT REPLACEMENT     bil knees   KNEE ARTHROPLASTY Left 06/29/2016   Procedure: LEFT TOTAL KNEE WITH  NAVIGATION;  Surgeon: Adonica Hoose, MD;  Location: [facility];  Service: Orthopedics;  Laterality: Left;   KNEE ARTHROPLASTY Right 12/14/2016   Procedure: RIGHT TOTAL KNEE ARTHROPLASTY WITH COMPUTER NAVIGATION;  Surgeon: Adonica Hoose, MD;  Location: [facility];  Service: Orthopedics;  Laterality: Right;  Needs RNFA   LUMBAR LAMINECTOMY/DECOMPRESSION MICRODISCECTOMY N/A 08/23/2018   Procedure: L3-4 DECOMPRESSION, REMOVAL OF INTRASPINAL EXTRADURAL FACET CYST;  Surgeon: Adah Acron, MD;  Location: [facility];  Service:  Orthopedics;  Laterality: N/A;   MENISCUS REPAIR     right knee   OOPHORECTOMY     ROBOTIC ASSISTED LAPAROSCOPIC SACROCOLPOPEXY N/A 06/20/2023   Procedure: XI ROBOTIC ASSISTED LAPAROSCOPIC SACROCOLPOPEXY;  Surgeon: Arma Lamp, MD;  Location: [facility];  Service: Gynecology;  Laterality: N/A;     FAMILY HISTORY:  Family History  Problem Relation Age of Onset   Breast cancer Maternal Aunt    Breast cancer Half-Sister 46   Liver disease Half-Sister      SOCIAL HISTORY:  reports that she has been smoking cigarettes. She started smoking about 30 years ago. She has a 10.1 pack-year smoking history. She has never used smokeless tobacco. She reports that she does not currently use alcohol . She reports current drug use.   ALLERGIES: Patient has no known allergies.   MEDICATIONS:  Current Outpatient Medications  Medication Sig Dispense Refill   acetaminophen  (TYLENOL ) 500 MG tablet Take 1 tablet (500 mg total) by mouth every 6 (six) hours as needed (pain). 30 tablet 0   BIOTIN PO Take 1,000 mcg by mouth.     clobetasol  ointment (TEMOVATE ) 0.05 % Apply 1 Application topically 2 (two) times daily. (Patient taking differently: Apply 1 Application topically as needed.) 100 g 2   Cyanocobalamin (VITAMIN B 12 PO) Take 2,500 mcg by mouth.     diphenhydramine -acetaminophen  (TYLENOL  PM) 25-500 MG TABS tablet Take 1 tablet by mouth at bedtime as needed.     JARDIANCE  10 MG TABS tablet TAKE 1 TABLET BY MOUTH DAILY  BEFORE BREAKFAST 100 tablet 2   losartan  (COZAAR ) 50 MG tablet TAKE 1 TABLET(50 MG) BY MOUTH DAILY 90 tablet 1   MAGNESIUM PO Take 500 mg by mouth daily.      spironolactone  (ALDACTONE ) 25 MG tablet TAKE 1 TABLET(25 MG) BY MOUTH DAILY 90 tablet 0   oxyCODONE  (OXY IR/ROXICODONE ) 5 MG immediate release tablet Take 1 tablet (5 mg total) by mouth every 6 (six) hours as needed for severe pain (pain score 7-10). (Patient not taking: Reported on 02/20/2024) 5 tablet 0    polyethylene glycol powder (GLYCOLAX /MIRALAX ) 17 GM/SCOOP powder Take 17 g by mouth daily. Drink 17g (1 scoop) dissolved in water  per day. (Patient not taking: Reported on 02/20/2024) 255 g 0   No current facility-administered medications for this encounter.     REVIEW OF SYSTEMS: Notable for that above     PHYSICAL EXAM:  Wt Readings from Last 3 Encounters:  01/29/24 169 lb 1.5 oz (76.7 kg)  01/02/24 178 lb 12.8 oz (81.1 kg)  12/06/23 173 lb 9.6 oz (78.7 kg)   Temp Readings from Last 3 Encounters:  01/29/24 98.2 F (36.8 C) (Temporal)  01/02/24 (!) 97.2 F (36.2 C) (Temporal)  12/06/23 98.4 F (36.9 C)   BP Readings from Last 3 Encounters:  01/29/24 119/68  01/02/24 (!) 97/55  12/06/23 110/76   Pulse Readings from Last 3 Encounters:  01/29/24 61  01/02/24 63  12/06/23 60    In general this is  a well appearing female in no acute distress. She's alert and oriented x4 and appropriate throughout the examination. Cardiopulmonary assessment is negative for acute distress and she exhibits normal effort.   Right breast: Firm tissue palpated beneath the lumpectomy incision, most consistent with a hematoma.  Incision edges are well-approximated with no evidence of delayed wound healing or infection.    ECOG = 0  0 - Asymptomatic (Fully active, able to carry on all predisease activities without restriction)  1 - Symptomatic but completely ambulatory (Restricted in physically strenuous activity but ambulatory and able to carry out work of a light or sedentary nature. For example, light housework, office work)  2 - Symptomatic, <50% in bed during the day (Ambulatory and capable of all self care but unable to carry out any work activities. Up and about more than 50% of waking hours)  3 - Symptomatic, >50% in bed, but not bedbound (Capable of only limited self-care, confined to bed or chair 50% or more of waking hours)  4 - Bedbound (Completely disabled. Cannot carry on any  self-care. Totally confined to bed or chair)  5 - Death   Aurea Blossom MM, Creech RH, Tormey DC, et al. [phone]). "Toxicity and response criteria of the [facility]". Am. Hillard Lowes. Oncol. 5 (6): 649-55    LABORATORY DATA:  Lab Results  Component Value Date   WBC 6.4 01/02/2024   HGB 13.3 01/02/2024   HCT 42.4 01/02/2024   MCV 88.3 01/02/2024   PLT 374 01/02/2024   Lab Results  Component Value Date   NA 135 01/02/2024   K 5.3 (H) 01/02/2024   CL 102 01/02/2024   CO2 28 01/02/2024   Lab Results  Component Value Date   ALT 17 01/02/2024   AST 21 01/02/2024   ALKPHOS 71 01/02/2024   BILITOT 0.4 01/02/2024      RADIOGRAPHY: MM Breast Surgical Specimen Result Date: 01/29/2024 CLINICAL DATA:  Status post RIGHT lumpectomy for ductal carcinoma in-situ (RIBBON clip). EXAM: SPECIMEN RADIOGRAPH OF THE RIGHT BREAST COMPARISON:  Previous exam(s). FINDINGS: Status post excision of the RIGHT breast. The radioactive seed and RIBBON biopsy marker clip are present, completely intact. IMPRESSION: Specimen radiograph of the RIGHT breast. Findings were discussed with operative team at time of interpretation. Electronically Signed   By: Clancy Crimes M.D.   On: 01/29/2024 10:36   MM RT RADIOACTIVE SEED LOC MAMMO GUIDE Result Date: 01/28/2024 CLINICAL DATA:  68 year old female presenting for seed localization of the right breast. Patient has newly diagnosed right breast DCIS. EXAM: MAMMOGRAPHIC GUIDED RADIOACTIVE SEED LOCALIZATION OF THE RIGHT BREAST COMPARISON:  Previous exam(s). FINDINGS: Patient presents for radioactive seed localization prior to right breast lumpectomy. I met with the patient and we discussed the procedure of seed localization including benefits and alternatives. We discussed the high likelihood of a successful procedure. We discussed the risks of the procedure including infection, bleeding, tissue injury and further surgery. We discussed the low dose of  radioactivity involved in the procedure. Informed, written consent was given. The usual time-out protocol was performed immediately prior to the procedure. Using mammographic guidance, sterile technique, 1% lidocaine  and an I-125 radioactive seed, the ribbon biopsy clip in the right breast was localized using a medial approach. The follow-up mammogram images confirm the seed in the expected location and were marked for Dr. Cherlynn Cornfield. Follow-up survey of the patient confirms presence of the radioactive seed. Order number of I-125 seed:  [id number]. Total activity: 0.271 mCi reference Date: 11/22/2023  The patient tolerated the procedure well and was released from the [facility]. She was given instructions regarding seed removal. IMPRESSION: Radioactive seed localization right breast. No apparent complications. Electronically Signed   By: Allena Ito M.D.   On: 01/28/2024 14:13       IMPRESSION/PLAN: 1. 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  Surgical pathology confirms intermediate grade DCIS that is ER and PR positive and a close margin of 0.3 mm.  Patient does have a history of right breast radiation for her previous breast cancer in 2011.  At this time, she does not want to proceed with antihormone treatment and is unsure if she would like to proceed with radiation.  We reviewed the MSK DCIS nomogram that predicts her probability of recurrence and 5/10 years with and without adjuvant treatment.   We reviewed the anticipated acute and late sequelae associated with radiation in this setting.  Given her history of radiation to the right breast, we highlighted the increased risk of fibrosis and cosmetic effect on the breast. The patient was encouraged to ask questions that I answered to the best of my ability.  She expressed full understanding of the treatment which is of curative intent.  At the end  of our discussion the patient expressed that she would like to think about her decision and follow-up early next week.  She has our nurse navigator's contact information and states she will let them know whether or not she would like to proceed with radiation treatment.  She was encouraged to call our office with any questions or concerns in the meantime.  In a visit lasting 30 minutes, greater than 50% of the time was spent face to face reviewing her case, as well as in preparation of, discussing, and coordinating the patient's care.  The above documentation reflects my direct findings during this shared patient visit. Please see the separate note by Dr. Jeryl Moris on this date for the remainder of the patient's plan of care.    Amiel Kalata, [place]    **Disclaimer: This note was dictated with voice recognition software. Similar sounding words can inadvertently be transcribed and this note may contain transcription errors which may not have been corrected upon publication of note.**

## 2024-02-29 ENCOUNTER — Encounter: Payer: Self-pay | Admitting: *Deleted

## 2024-02-29 NOTE — Progress Notes (Unsigned)
 Received in-basket from navigation team stating pt has declined radiation as well as survivorship appt.  Message sent to our scheduling team to arrange MD f/u.

## 2024-03-12 ENCOUNTER — Inpatient Hospital Stay: Attending: Hematology and Oncology | Admitting: Hematology and Oncology

## 2024-03-12 ENCOUNTER — Encounter: Payer: Self-pay | Admitting: Obstetrics and Gynecology

## 2024-03-12 ENCOUNTER — Ambulatory Visit: Admitting: Obstetrics and Gynecology

## 2024-03-12 VITALS — BP 138/73 | HR 67

## 2024-03-12 VITALS — BP 120/60 | HR 65 | Temp 97.6°F | Resp 18 | Ht 65.5 in | Wt 169.6 lb

## 2024-03-12 DIAGNOSIS — N8110 Cystocele, unspecified: Secondary | ICD-10-CM | POA: Diagnosis not present

## 2024-03-12 DIAGNOSIS — Z9221 Personal history of antineoplastic chemotherapy: Secondary | ICD-10-CM | POA: Insufficient documentation

## 2024-03-12 DIAGNOSIS — Z923 Personal history of irradiation: Secondary | ICD-10-CM | POA: Insufficient documentation

## 2024-03-12 DIAGNOSIS — D0511 Intraductal carcinoma in situ of right breast: Secondary | ICD-10-CM | POA: Insufficient documentation

## 2024-03-12 DIAGNOSIS — Z170 Estrogen receptor positive status [ER+]: Secondary | ICD-10-CM | POA: Insufficient documentation

## 2024-03-12 NOTE — Progress Notes (Signed)
 [facility] CONSULT NOTE  Patient Care Team: Adelia Homestead, MD as PCP - General (Internal Medicine) Alane Hsu, RN as Oncology Nurse Navigator Auther Bo, RN as Oncology Nurse Navigator [name], MD as Consulting Physician (General Surgery) Murleen Arms, MD as Consulting Physician (Hematology and Oncology) Johna Myers, MD as Consulting Physician (Radiation Oncology)  CHIEF COMPLAINTS/PURPOSE OF CONSULTATION:  DCIS right breast.  ASSESSMENT & PLAN:  Assessment and Plan Assessment & Plan Ductal carcinoma in situ (DCIS), stage 0 Previously treated with surgery. She declined radiation and hormonal therapy due to side effect concerns. Discussed recurrence risks: 11% at 5 years, 17% at 10 years without further treatment. Radiation reduces risks to 4% and 7%; hormonal therapy to 5% and 9%. She prefers no further treatment, aware of increased recurrence risk. - Order annual diagnostic mammogram for five years. - Schedule annual follow-up appointment. - Instruct on monthly self-breast exams and immediate reporting of new lumps, nipple inversion, or discharge.   HISTORY OF PRESENTING ILLNESS:  [name] 68 y.o. female is here because of right breast DCIS.  Oncology History  Ductal carcinoma in situ (DCIS) of right breast  12/06/2023 Mammogram   Possible mass in the right breast warranting further evaluation. Suspicious mass in the right breast at 3:30 o clock measuring 8 mm   12/26/2023 Pathology Results   DCIS with necrosis and calcs present, ER and PR strongly positive.   12/31/2023 Initial Diagnosis   Ductal carcinoma in situ (DCIS) of right breast   01/02/2024 Cancer Staging   Staging form: Breast, AJCC 8th Edition - Clinical stage from 01/02/2024: Stage 0 (cTis (DCIS), cN0, cM0, ER+, PR+) - Signed by Murleen Arms, MD on 01/02/2024 Stage prefix: Initial diagnosis Nuclear grade: G2   01/11/2024 Genetic Testing   Negative genetic testing on  the BRCAPlus panel and CancerNext-Expanded+RNAinsight panel.  The report date is January 11, 2024 and January 13, 2024.  The CancerNext-Expanded gene panel offered by [facility] and includes sequencing, rearrangement, and RNA analysis for the following 76 genes: AIP, ALK, APC, ATM, AXIN2, BAP1, BARD1, BMPR1A, BRCA1, BRCA2, BRIP1, CDC73, CDH1, CDK4, CDKN1B, CDKN2A, CEBPA, CHEK2, CTNNA1, DDX41, DICER1, ETV6, FH, FLCN, GATA2, LZTR1, MAX, MBD4, MEN1, MET, MLH1, MSH2, MSH3, MSH6, MUTYH, NF1, NF2, NTHL1, PALB2, PHOX2B, PMS2, POT1, PRKAR1A, PTCH1, PTEN, RAD51C, RAD51D, RB1, RET, RUNX1, SDHA, SDHAF2, SDHB, SDHC, SDHD, SMAD4, SMARCA4, SMARCB1, SMARCE1, STK11, SUFU, TMEM127, TP53, TSC1, TSC2, VHL, and WT1 (sequencing and deletion/duplication); EGFR, HOXB13, KIT, MITF, PDGFRA, POLD1, and POLE (sequencing only); EPCAM and GREM1 (deletion/duplication only).  The BRCAPlus gene panel offered by [employer] [facility] and includes sequencing and rearrangement analysis for the following 13 genes: ATM, BARD1, BRCA1, BRCA2, CDH1, CHEK2, NF1, PALB2, PTEN, RAD51C, RAD51D, STK11 and TP53.      Discussed the use of AI scribe software for clinical note transcription with the patient, who gave verbal consent to proceed.  History of Present Illness [name] is a 68 year old female who presents for follow-up after surgery and radiation therapy for breast cancer.  She underwent surgery for ductal carcinoma in situ (DCIS) in April, didn't want to proceed with radiation therapy.. The cancer She is also hoping not to consider anti estrogen therapy. She says she knows too many people who take it and have side effects.  She is here to discuss more about this.,  MEDICAL HISTORY:  Past Medical History:  Diagnosis Date   Breast cancer [facility]) 2011   right breast with axillary  lymph node removal   Breast cancer (HCC) 01/2024   DCIS right breast   Chronic kidney disease stage 1    Diabetes mellitus, type II (HCC)    type 2    Family history of breast cancer    History of COVID-19 05/01/2023   Hypertension    Osteoarthritis of both knees    Personal history of chemotherapy    Personal history of radiation therapy    Pneumonia    in early 1980's   Wears glasses     SURGICAL HISTORY: Past Surgical History:  Procedure Laterality Date   ABDOMINAL HYSTERECTOMY  1979   APPENDECTOMY  1978   BLADDER SUSPENSION     BREAST BIOPSY Right 12/26/2023   US  RT BREAST BX W LOC DEV 1ST LESION IMG BX SPEC US  GUIDE 12/26/2023 [facility]   BREAST BIOPSY  01/28/2024   MM RT RADIOACTIVE SEED LOC MAMMO GUIDE 01/28/2024 [facility]   BREAST LUMPECTOMY Right 2011   BREAST LUMPECTOMY WITH RADIOACTIVE SEED LOCALIZATION Right 01/29/2024   Procedure: BREAST LUMPECTOMY WITH RADIOACTIVE SEED LOCALIZATION;  Surgeon: [name], MD;  Location: [facility];  Service: General;  Laterality: Right;  RIGHT BREAST SEED LOCALIZED LUMPECTOMY   CYSTOSCOPY N/A 06/20/2023   Procedure: CYSTOSCOPY;  Surgeon: Arma Lamp, MD;  Location: [facility];  Service: Gynecology;  Laterality: N/A;   JOINT REPLACEMENT     bil knees   KNEE ARTHROPLASTY Left 06/29/2016   Procedure: LEFT TOTAL KNEE WITH NAVIGATION;  Surgeon: Adonica Hoose, MD;  Location: [facility];  Service: Orthopedics;  Laterality: Left;   KNEE ARTHROPLASTY Right 12/14/2016   Procedure: RIGHT TOTAL KNEE ARTHROPLASTY WITH COMPUTER NAVIGATION;  Surgeon: Adonica Hoose, MD;  Location: [facility];  Service: Orthopedics;  Laterality: Right;  Needs RNFA   LUMBAR LAMINECTOMY/DECOMPRESSION MICRODISCECTOMY N/A 08/23/2018   Procedure: L3-4 DECOMPRESSION, REMOVAL OF INTRASPINAL EXTRADURAL FACET CYST;  Surgeon: Adah Acron, MD;  Location: [facility];  Service: Orthopedics;  Laterality: N/A;   MENISCUS REPAIR     right knee   OOPHORECTOMY     ROBOTIC ASSISTED LAPAROSCOPIC SACROCOLPOPEXY N/A 06/20/2023   Procedure: XI ROBOTIC ASSISTED LAPAROSCOPIC SACROCOLPOPEXY;   Surgeon: Arma Lamp, MD;  Location: [facility];  Service: Gynecology;  Laterality: N/A;    SOCIAL HISTORY: Social History   Socioeconomic History   Marital status: Married    Spouse name: Gaylin Ke   Number of children: 3   Years of education: Not on file   Highest education level: Not on file  Occupational History   Occupation: medical records    Employer: [employer]  Tobacco Use   Smoking status: Every Day    Current packs/day: 0.25    Average packs/day: 0.5 packs/day for 20.4 years (10.1 ttl pk-yrs)    Types: Cigarettes    Start date: 10/14/1993    Last attempt to quit: 10/14/2013   Smokeless tobacco: Never  Vaping Use   Vaping status: Never Used  Substance and Sexual Activity   Alcohol  use: Not Currently   Drug use: Yes    Comment: THC gummies prn 2 x week LD 01-26-24   Sexual activity: Yes    Birth control/protection: Post-menopausal, Surgical    Comment: hyst  Other Topics Concern   Not on file  Social History Narrative   Not on file   Social Drivers of Health   Financial Resource Strain: Low Risk  (05/14/2023)   Overall Financial Resource Strain (CARDIA)  Difficulty of Paying Living Expenses: Not hard at all  Food Insecurity: No Food Insecurity (02/20/2024)   Hunger Vital Sign    Worried About Running Out of Food in the Last Year: Never true    Ran Out of Food in the Last Year: Never true  Transportation Needs: No Transportation Needs (01/02/2024)   PRAPARE - Administrator, Civil Service (Medical): No    Lack of Transportation (Non-Medical): No  Physical Activity: Sufficiently Active (09/08/2022)   Exercise Vital Sign    Days of Exercise per Week: 5 days    Minutes of Exercise per Session: 50 min  Stress: No Stress Concern Present (05/14/2023)   [employer] of Occupational Health - Occupational Stress Questionnaire    Feeling of Stress : Only a little  Social Connections: Socially Integrated (09/08/2022)   Social  Connection and Isolation Panel [NHANES]    Frequency of Communication with Friends and Family: More than three times a week    Frequency of Social Gatherings with Friends and Family: More than three times a week    Attends Religious Services: More than 4 times per year    Active Member of [employer] or Organizations: Yes    Attends Engineer, structural: More than 4 times per year    Marital Status: Married  Catering manager Violence: Not At Risk (02/20/2024)   Humiliation, Afraid, Rape, and Kick questionnaire    Fear of Current or Ex-Partner: No    Emotionally Abused: No    Physically Abused: No    Sexually Abused: No    FAMILY HISTORY: Family History  Problem Relation Age of Onset   Breast cancer Maternal Aunt    Breast cancer Half-Sister 1   Liver disease Half-Sister     ALLERGIES:  has no known allergies.  MEDICATIONS:  Current Outpatient Medications  Medication Sig Dispense Refill   acetaminophen  (TYLENOL ) 500 MG tablet Take 1 tablet (500 mg total) by mouth every 6 (six) hours as needed (pain). 30 tablet 0   BIOTIN PO Take 1,000 mcg by mouth.     clobetasol  ointment (TEMOVATE ) 0.05 % Apply 1 Application topically 2 (two) times daily. (Patient taking differently: Apply 1 Application topically as needed.) 100 g 2   Cyanocobalamin (VITAMIN B 12 PO) Take 2,500 mcg by mouth.     diphenhydramine -acetaminophen  (TYLENOL  PM) 25-500 MG TABS tablet Take 1 tablet by mouth at bedtime as needed.     JARDIANCE  10 MG TABS tablet TAKE 1 TABLET BY MOUTH DAILY  BEFORE BREAKFAST 100 tablet 2   losartan  (COZAAR ) 50 MG tablet TAKE 1 TABLET(50 MG) BY MOUTH DAILY 90 tablet 1   MAGNESIUM PO Take 500 mg by mouth daily.      spironolactone  (ALDACTONE ) 25 MG tablet TAKE 1 TABLET(25 MG) BY MOUTH DAILY 90 tablet 0   No current facility-administered medications for this visit.     PHYSICAL EXAMINATION: ECOG PERFORMANCE STATUS: 0 - Asymptomatic  Vitals:   03/12/24 1429  BP: 120/60  Pulse: 65   Resp: 18  Temp: 97.6 F (36.4 C)  SpO2: 100%   Filed Weights   03/12/24 1429  Weight: 169 lb 9 oz (76.9 kg)    GENERAL:alert, no distress and comfortable   LABORATORY DATA:  I have reviewed the data as listed Lab Results  Component Value Date   WBC 6.4 01/02/2024   HGB 13.3 01/02/2024   HCT 42.4 01/02/2024   MCV 88.3 01/02/2024   PLT 374 01/02/2024  Chemistry      Component Value Date/Time   NA 135 01/02/2024 1133   K 5.3 (H) 01/02/2024 1133   CL 102 01/02/2024 1133   CO2 28 01/02/2024 1133   BUN 22 01/02/2024 1133   CREATININE 1.69 (H) 01/02/2024 1133      Component Value Date/Time   CALCIUM 9.9 01/02/2024 1133   ALKPHOS 71 01/02/2024 1133   AST 21 01/02/2024 1133   ALT 17 01/02/2024 1133   BILITOT 0.4 01/02/2024 1133       RADIOGRAPHIC STUDIES: I have personally reviewed the radiological images as listed and agreed with the findings in the report. No results found.   All questions were answered. The patient knows to call the clinic with any problems, questions or concerns. I spent 20 minutes in the care of this patient including H and P, review of records, counseling and coordination of care.     Murleen Arms, MD 03/12/2024 2:34 PM

## 2024-03-12 NOTE — Progress Notes (Signed)
 [facility] Return Visit  SUBJECTIVE  History of Present Illness: [name] is a 68 y.o. female seen in follow-up for prolapse. She is  s/p Robotic assisted sacrocolpopexy Cleopatra Dada Lite Y), cystoscopy on 06/20/23   She does not notice any issues with prolapse- she sometimes notices it but it is not bothersome to her. When she is intimate then she feels maybe more pressure. Denies pain. Not using any lubrication.   She has been diagnosed with DCIS of the right breast and had lumpectomy in April.   Past Medical History: Patient  has a past medical history of Breast cancer (HCC) (2011), Breast cancer (HCC) (01/2024), Chronic kidney disease stage 1, Diabetes mellitus, type II (HCC), Family history of breast cancer, History of COVID-19 (05/01/2023), Hypertension, Osteoarthritis of both knees, Personal history of chemotherapy, Personal history of radiation therapy, Pneumonia, and Wears glasses.   Past Surgical History: She  has a past surgical history that includes Abdominal hysterectomy (1979); Bladder suspension; Oophorectomy; Meniscus repair; Appendectomy (1978); Knee Arthroplasty (Left, 06/29/2016); Knee Arthroplasty (Right, 12/14/2016); Joint replacement; Lumbar laminectomy/decompression microdiscectomy (N/A, 08/23/2018); Breast lumpectomy (Right, 2011); Robotic assisted laparoscopic sacrocolpopexy (N/A, 06/20/2023); Cystoscopy (N/A, 06/20/2023); Breast biopsy (Right, 12/26/2023); Breast biopsy (01/28/2024); and Breast lumpectomy with radioactive seed localization (Right, 01/29/2024).   Medications: She has a current medication list which includes the following prescription(s): acetaminophen , biotin, clobetasol  ointment, cyanocobalamin, diphenhydramine -acetaminophen , jardiance , losartan , magnesium, spironolactone , [DISCONTINUED] olmesartan-amlodipine -hctz, and [DISCONTINUED] potassium chloride  er.   Allergies: Patient has no known allergies.   Social History: Patient  reports  that she has been smoking cigarettes. She started smoking about 30 years ago. She has a 10.1 pack-year smoking history. She has never used smokeless tobacco. She reports that she does not currently use alcohol . She reports current drug use.     OBJECTIVE     Physical Exam: Vitals:   03/12/24 0830  BP: 138/73  Pulse: 67   Gen: No apparent distress, A&O x 3.  Detailed Urogynecologic Evaluation:  Normal external genitalia. On speculum, normal vaginal mucosa. On bimanual, no masses present. No visible or palpable mesh.    POP-Q:  POP-Q  0                                            Aa   0                                           Ba  -5                                              C   4                                            Gh  4.5                                            Pb  8  tvl   -2                                            Ap  -2                                            Bp                                                 D      ASSESSMENT AND PLAN    [name] is a 68 y.o. with:  1. Prolapse of anterior vaginal wall    - We discussed that she does have some recurrence of prolapse that has progressed slightly since surgery. Unclear why the surgery has not held, but offered expectant management vs revision Sacrocolpopexy or vaginal prolapse repair.  - Since the prolapse is not bothersome to her at this time, she wants to wait for any intervention. She will return for further evaluation if symptoms worsen.   Arma Lamp, MD  Time spent: I spent 27 minutes dedicated to the care of this patient on the date of this encounter to include pre-visit review of records, face-to-face time with the patient and post visit documentation.

## 2024-03-19 ENCOUNTER — Ambulatory Visit
Admission: EM | Admit: 2024-03-19 | Discharge: 2024-03-19 | Disposition: A | Discharge status: Home / Self Care | Attending: Family Medicine | Admitting: Family Medicine

## 2024-03-19 DIAGNOSIS — R059 Cough, unspecified: Secondary | ICD-10-CM | POA: Diagnosis not present

## 2024-03-19 DIAGNOSIS — J069 Acute upper respiratory infection, unspecified: Secondary | ICD-10-CM | POA: Diagnosis not present

## 2024-03-19 LAB — POC SARS CORONAVIRUS 2 AG -  ED: SARS Coronavirus 2 Ag: NEGATIVE

## 2024-03-19 MED ORDER — PROMETHAZINE-DM 6.25-15 MG/5ML PO SYRP: 5.0000 mL | ORAL_SOLUTION | Freq: Four times a day (QID) | ORAL | 0 refills | Status: AC | PRN

## 2024-03-19 NOTE — Discharge Instructions (Addendum)
 COVID test was negative today.  I suspect a different type of viral respiratory infection to be causing her symptoms.  I provided a good cough syrup and you may continue taking the over-the-counter cold and congestion medications, Flonase  nasal spray, saline sinus rinses, humidifiers.  Follow-up for significantly worsening symptoms.

## 2024-03-19 NOTE — ED Triage Notes (Signed)
 Pt reports cough, congestion, sinus drainage x  3 days. States a spider bit her on Saturday and she woke up with her sx's on Sunday.

## 2024-03-20 NOTE — ED Provider Notes (Signed)
 [facility]    CSN: [id number] Arrival date & time: 03/19/24  4782      History   Chief Complaint No chief complaint on file.   HPI [name] is a 68 y.o. female.   Presenting today with 3-day history of cough, congestion, sinus drainage, fatigue.  Denies chest pain, shortness of breath, abdominal pain, vomiting, diarrhea.  So far trying over-the-counter cold and congestion medication with minimal relief.  No known history of chronic pulmonary disease.    Past Medical History:  Diagnosis Date   Breast cancer [facility]) 2011   right breast with axillary lymph node removal   Breast cancer (HCC) 01/2024   DCIS right breast   Chronic kidney disease stage 1    Diabetes mellitus, type II (HCC)    type 2   Family history of breast cancer    History of COVID-19 05/01/2023   Hypertension    Osteoarthritis of both knees    Personal history of chemotherapy    Personal history of radiation therapy    Pneumonia    in early 1980's   Wears glasses     Patient Active Problem List   Diagnosis Date Noted   Genetic testing 01/11/2024   Personal history of malignant neoplasm of breast 01/02/2024   Family history of breast cancer    Ductal carcinoma in situ (DCIS) of right breast 12/31/2023   Tobacco abuse 12/06/2023   Vaginal vault prolapse after hysterectomy 06/20/2023   Hyperlipidemia associated with type 2 diabetes mellitus (HCC) 09/15/2022   Calculus of gallbladder without cholecystitis without obstruction 09/06/2021   Left hip pain 12/01/2020   Pain of back and lower extremity 07/10/2018   Routine general medical examination at a health care facility 07/26/2017   Osteoarthritis of right knee 12/14/2016   Primary osteoarthritis of left knee 06/29/2016   Diabetes mellitus type 2 with complications (HCC) 06/14/2015   GERD (gastroesophageal reflux disease) 04/16/2015   Chronic venous insufficiency 02/10/2015   Obesity (BMI 30.0-34.9) 02/10/2015   OSA  (obstructive sleep apnea) 09/05/2012   Essential hypertension, benign 12/31/2011    Past Surgical History:  Procedure Laterality Date   ABDOMINAL HYSTERECTOMY  1979   APPENDECTOMY  1978   BLADDER SUSPENSION     BREAST BIOPSY Right 12/26/2023   US  RT BREAST BX W LOC DEV 1ST LESION IMG BX SPEC US  GUIDE 12/26/2023 [facility]   BREAST BIOPSY  01/28/2024   MM RT RADIOACTIVE SEED LOC MAMMO GUIDE 01/28/2024 [facility]   BREAST LUMPECTOMY Right 2011   BREAST LUMPECTOMY WITH RADIOACTIVE SEED LOCALIZATION Right 01/29/2024   Procedure: BREAST LUMPECTOMY WITH RADIOACTIVE SEED LOCALIZATION;  Surgeon: [name], MD;  Location: [facility];  Service: General;  Laterality: Right;  RIGHT BREAST SEED LOCALIZED LUMPECTOMY   CYSTOSCOPY N/A 06/20/2023   Procedure: CYSTOSCOPY;  Surgeon: Arma Lamp, MD;  Location: [facility];  Service: Gynecology;  Laterality: N/A;   JOINT REPLACEMENT     bil knees   KNEE ARTHROPLASTY Left 06/29/2016   Procedure: LEFT TOTAL KNEE WITH NAVIGATION;  Surgeon: Adonica Hoose, MD;  Location: [facility];  Service: Orthopedics;  Laterality: Left;   KNEE ARTHROPLASTY Right 12/14/2016   Procedure: RIGHT TOTAL KNEE ARTHROPLASTY WITH COMPUTER NAVIGATION;  Surgeon: Adonica Hoose, MD;  Location: [facility];  Service: Orthopedics;  Laterality: Right;  Needs RNFA   LUMBAR LAMINECTOMY/DECOMPRESSION MICRODISCECTOMY N/A 08/23/2018   Procedure: L3-4 DECOMPRESSION, REMOVAL OF INTRASPINAL EXTRADURAL FACET CYST;  Surgeon: Adah Acron,  MD;  Location: [facility];  Service: Orthopedics;  Laterality: N/A;   MENISCUS REPAIR     right knee   OOPHORECTOMY     ROBOTIC ASSISTED LAPAROSCOPIC SACROCOLPOPEXY N/A 06/20/2023   Procedure: XI ROBOTIC ASSISTED LAPAROSCOPIC SACROCOLPOPEXY;  Surgeon: Arma Lamp, MD;  Location: [facility];  Service: Gynecology;  Laterality: N/A;    OB History     Gravida  3   Para  3   Term  3   Preterm       AB      Living  3      SAB      IAB      Ectopic      Multiple      Live Births               Home Medications    Prior to Admission medications   Medication Sig Start Date End Date Taking? Authorizing Provider  promethazine -dextromethorphan (PROMETHAZINE -DM) 6.25-15 MG/5ML syrup Take 5 mLs by mouth 4 (four) times daily as needed. 03/19/24  Yes Corbin Dess, PA-C  acetaminophen  (TYLENOL ) 500 MG tablet Take 1 tablet (500 mg total) by mouth every 6 (six) hours as needed (pain). 06/20/23   Arma Lamp, MD  BIOTIN PO Take 1,000 mcg by mouth.    [provider]  clobetasol  ointment (TEMOVATE ) 0.05 % Apply 1 Application topically 2 (two) times daily. Patient taking differently: Apply 1 Application topically as needed. 05/22/22   Adelia Homestead, MD  Cyanocobalamin (VITAMIN B 12 PO) Take 2,500 mcg by mouth.    [provider]  diphenhydramine -acetaminophen  (TYLENOL  PM) 25-500 MG TABS tablet Take 1 tablet by mouth at bedtime as needed.    [provider]  JARDIANCE  10 MG TABS tablet TAKE 1 TABLET BY MOUTH DAILY  BEFORE BREAKFAST 12/11/23   Adelia Homestead, MD  losartan  (COZAAR ) 50 MG tablet TAKE 1 TABLET(50 MG) BY MOUTH DAILY 02/06/24   Adelia Homestead, MD  MAGNESIUM PO Take 500 mg by mouth daily.     [provider]  spironolactone  (ALDACTONE ) 25 MG tablet TAKE 1 TABLET(25 MG) BY MOUTH DAILY 01/07/24   Adelia Homestead, MD  Olmesartan-Amlodipine -HCTZ (TRIBENZOR) 40-5-25 MG TABS Take 1 tablet by mouth daily.  12/20/11  [provider]  potassium chloride  20 MEQ TBCR Take 20 mEq by mouth as needed. 10/29/13 06/29/14  Nieves Bars D, NP    Family History Family History  Problem Relation Age of Onset   Breast cancer Maternal Aunt    Breast cancer Half-Sister 20   Liver disease Half-Sister     Social History Social History   Tobacco Use   Smoking status: Every Day    Current packs/day: 0.25     Average packs/day: 0.5 packs/day for 20.5 years (10.1 ttl pk-yrs)    Types: Cigarettes    Start date: 10/14/1993    Last attempt to quit: 10/14/2013   Smokeless tobacco: Never  Vaping Use   Vaping status: Never Used  Substance Use Topics   Alcohol  use: Not Currently   Drug use: Yes    Comment: THC gummies prn 2 x week LD 01-26-24     Allergies   Patient has no known allergies.   Review of Systems Review of Systems Per HPI  Physical Exam Triage Vital Signs ED Triage Vitals  Encounter Vitals Group     BP --      Girls Systolic BP Percentile --  Girls Diastolic BP Percentile --      Boys Systolic BP Percentile --      Boys Diastolic BP Percentile --      Pulse Rate 03/19/24 0943 69     Resp 03/19/24 0943 20     Temp 03/19/24 0943 98.6 F (37 C)     Temp Source 03/19/24 0943 Oral     SpO2 03/19/24 0943 98 %     Weight --      Height --      Head Circumference --      Peak Flow --      Pain Score 03/19/24 0946 6     Pain Loc --      Pain Education --      Exclude from Growth Chart --    No data found.  Updated Vital Signs Pulse 69   Temp 98.6 F (37 C) (Oral)   Resp 20   SpO2 98%   Visual Acuity Right Eye Distance:   Left Eye Distance:   Bilateral Distance:    Right Eye Near:   Left Eye Near:    Bilateral Near:     Physical Exam Vitals and nursing note reviewed.  Constitutional:      Appearance: Normal appearance.  HENT:     Head: Atraumatic.     Right Ear: Tympanic membrane and external ear normal.     Left Ear: Tympanic membrane and external ear normal.     Nose: Rhinorrhea present. No congestion.     Mouth/Throat:     Mouth: Mucous membranes are moist.     Pharynx: Posterior oropharyngeal erythema present.   Eyes:     Extraocular Movements: Extraocular movements intact.     Conjunctiva/sclera: Conjunctivae normal.    Cardiovascular:     Rate and Rhythm: Normal rate and regular rhythm.     Heart sounds: Normal heart sounds.   Pulmonary:     Effort: Pulmonary effort is normal.     Breath sounds: Normal breath sounds. No wheezing or rales.   Musculoskeletal:        General: Normal range of motion.     Cervical back: Normal range of motion and neck supple.   Skin:    General: Skin is warm and dry.   Neurological:     Mental Status: She is alert and oriented to person, place, and time.   Psychiatric:        Mood and Affect: Mood normal.        Thought Content: Thought content normal.      UC Treatments / Results  Labs (all labs ordered are listed, but only abnormal results are displayed) Labs Reviewed  POC SARS CORONAVIRUS 2 AG -  ED    EKG   Radiology No results found.  Procedures Procedures (including critical care time)  Medications Ordered in UC Medications - No data to display  Initial Impression / Assessment and Plan / UC Course  I have reviewed the triage vital signs and the nursing notes.  Pertinent labs & imaging results that were available during my care of the patient were reviewed by me and considered in my medical decision making (see chart for details).     Vital signs and exam very reassuring today, suspicious for viral respiratory infection.  Rapid COVID-negative.  Treat with Phenergan  DM, supportive over-the-counter medications and home care.  Return for worsening symptoms.  Final Clinical Impressions(s) / UC Diagnoses   Final diagnoses:  Viral URI with cough  Discharge Instructions      COVID test was negative today.  I suspect a different type of viral respiratory infection to be causing her symptoms.  I provided a good cough syrup and you may continue taking the over-the-counter cold and congestion medications, Flonase  nasal spray, saline sinus rinses, humidifiers.  Follow-up for significantly worsening symptoms.    ED Prescriptions     Medication Sig Dispense Auth. Provider   promethazine -dextromethorphan (PROMETHAZINE -DM) 6.25-15 MG/5ML syrup Take 5  mLs by mouth 4 (four) times daily as needed. 100 mL Corbin Dess, [place]      PDMP not reviewed this encounter.   Corbin Dess, [place] 03/20/24 1802

## 2024-04-03 ENCOUNTER — Other Ambulatory Visit: Payer: Self-pay | Admitting: Internal Medicine

## 2024-04-03 DIAGNOSIS — E119 Type 2 diabetes mellitus without complications: Secondary | ICD-10-CM

## 2024-04-15 DIAGNOSIS — C50911 Malignant neoplasm of unspecified site of right female breast: Secondary | ICD-10-CM | POA: Diagnosis not present

## 2024-04-16 DIAGNOSIS — C50911 Malignant neoplasm of unspecified site of right female breast: Secondary | ICD-10-CM | POA: Diagnosis not present

## 2024-04-24 DIAGNOSIS — H25013 Cortical age-related cataract, bilateral: Secondary | ICD-10-CM | POA: Diagnosis not present

## 2024-05-14 ENCOUNTER — Other Ambulatory Visit: Payer: Self-pay | Admitting: Internal Medicine

## 2024-07-01 DIAGNOSIS — H18413 Arcus senilis, bilateral: Secondary | ICD-10-CM | POA: Diagnosis not present

## 2024-07-01 DIAGNOSIS — H2512 Age-related nuclear cataract, left eye: Secondary | ICD-10-CM | POA: Diagnosis not present

## 2024-07-01 DIAGNOSIS — H25043 Posterior subcapsular polar age-related cataract, bilateral: Secondary | ICD-10-CM | POA: Diagnosis not present

## 2024-07-01 DIAGNOSIS — H2513 Age-related nuclear cataract, bilateral: Secondary | ICD-10-CM | POA: Diagnosis not present

## 2024-07-01 DIAGNOSIS — H25013 Cortical age-related cataract, bilateral: Secondary | ICD-10-CM | POA: Diagnosis not present

## 2024-07-06 ENCOUNTER — Other Ambulatory Visit: Payer: Self-pay | Admitting: Internal Medicine

## 2024-07-06 DIAGNOSIS — E119 Type 2 diabetes mellitus without complications: Secondary | ICD-10-CM

## 2024-07-28 DIAGNOSIS — H2512 Age-related nuclear cataract, left eye: Secondary | ICD-10-CM | POA: Diagnosis not present

## 2024-07-29 DIAGNOSIS — H2511 Age-related nuclear cataract, right eye: Secondary | ICD-10-CM | POA: Diagnosis not present

## 2024-08-31 ENCOUNTER — Other Ambulatory Visit: Payer: Self-pay | Admitting: Internal Medicine

## 2024-10-10 ENCOUNTER — Other Ambulatory Visit: Payer: Self-pay | Admitting: Internal Medicine

## 2024-10-10 DIAGNOSIS — E119 Type 2 diabetes mellitus without complications: Secondary | ICD-10-CM

## 2024-10-13 ENCOUNTER — Encounter: Payer: Self-pay | Admitting: *Deleted

## 2024-12-09 ENCOUNTER — Encounter

## 2025-03-13 ENCOUNTER — Ambulatory Visit: Admitting: Hematology and Oncology
